# Patient Record
Sex: Male | Born: 1965 | Race: Black or African American | Hispanic: No | Marital: Married | Smoking: Current every day smoker
Health system: Southern US, Community
[De-identification: ages and names within clinical notes are randomized; demographics above are authoritative.]

## PROBLEM LIST (undated history)

## (undated) DIAGNOSIS — I10 Essential (primary) hypertension: Secondary | ICD-10-CM

## (undated) DIAGNOSIS — W3400XA Accidental discharge from unspecified firearms or gun, initial encounter: Secondary | ICD-10-CM

## (undated) DIAGNOSIS — Y249XXA Unspecified firearm discharge, undetermined intent, initial encounter: Secondary | ICD-10-CM

## (undated) HISTORY — PX: OTHER SURGICAL HISTORY: SHX169

## (undated) HISTORY — PX: ELBOW SURGERY: SHX618

## (undated) HISTORY — DX: Unspecified firearm discharge, undetermined intent, initial encounter: Y24.9XXA

## (undated) HISTORY — DX: Accidental discharge from unspecified firearms or gun, initial encounter: W34.00XA

## (undated) HISTORY — PX: KNEE SURGERY: SHX244

---

## 2019-09-21 ENCOUNTER — Other Ambulatory Visit: Payer: Self-pay

## 2019-09-22 ENCOUNTER — Encounter: Payer: Self-pay | Admitting: Gastroenterology

## 2019-09-22 ENCOUNTER — Encounter: Payer: Self-pay | Admitting: Medical

## 2019-09-22 ENCOUNTER — Ambulatory Visit (INDEPENDENT_AMBULATORY_CARE_PROVIDER_SITE_OTHER): Payer: Medicare Other | Admitting: Medical

## 2019-09-22 ENCOUNTER — Other Ambulatory Visit: Payer: Self-pay | Admitting: *Deleted

## 2019-09-22 VITALS — BP 143/90 | HR 75 | Temp 98.1°F | Resp 16 | Ht 72.0 in | Wt 199.2 lb

## 2019-09-22 DIAGNOSIS — N529 Male erectile dysfunction, unspecified: Secondary | ICD-10-CM

## 2019-09-22 DIAGNOSIS — R5383 Other fatigue: Secondary | ICD-10-CM | POA: Diagnosis not present

## 2019-09-22 DIAGNOSIS — M5441 Lumbago with sciatica, right side: Secondary | ICD-10-CM | POA: Diagnosis not present

## 2019-09-22 DIAGNOSIS — R3915 Urgency of urination: Secondary | ICD-10-CM

## 2019-09-22 DIAGNOSIS — G8929 Other chronic pain: Secondary | ICD-10-CM

## 2019-09-22 DIAGNOSIS — F17200 Nicotine dependence, unspecified, uncomplicated: Secondary | ICD-10-CM | POA: Diagnosis not present

## 2019-09-22 DIAGNOSIS — I10 Essential (primary) hypertension: Secondary | ICD-10-CM

## 2019-09-22 DIAGNOSIS — Z1211 Encounter for screening for malignant neoplasm of colon: Secondary | ICD-10-CM

## 2019-09-22 DIAGNOSIS — Z125 Encounter for screening for malignant neoplasm of prostate: Secondary | ICD-10-CM

## 2019-09-22 MED ORDER — LOSARTAN POTASSIUM 25 MG PO TABS: 25.0000 mg | ORAL_TABLET | Freq: Every day | ORAL | 3 refills | Status: DC

## 2019-09-22 MED ORDER — TADALAFIL 20 MG PO TABS: 10.0000 mg | ORAL_TABLET | ORAL | 11 refills | Status: DC | PRN

## 2019-09-22 NOTE — Progress Notes (Signed)
Patient called while at the pharmacy and was not able to receive prescriptions due to them being printed. Prescriptions resent to pharmacy electronically.

## 2019-09-22 NOTE — Patient Instructions (Addendum)
For htn rx losartan 25 mg daily.  For ED, I rx cialis. Rx advisement given.  For smoking will consider rx wellbutrin but want to make sure bp levels controlled first.  Will get cbc, cmp, lipid panel and psa today.(future fasting later this week or early next week)  For back pain continue current gabapentin, flexril and PT.  Referal place for screening coloncopy.  Follow up 2-3 weeks for bp check.
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
## 2019-10-01 ENCOUNTER — Other Ambulatory Visit (INDEPENDENT_AMBULATORY_CARE_PROVIDER_SITE_OTHER): Payer: Medicare Other

## 2019-10-01 ENCOUNTER — Other Ambulatory Visit: Payer: Self-pay

## 2019-10-01 ENCOUNTER — Other Ambulatory Visit: Payer: Medicare Other

## 2019-10-01 DIAGNOSIS — R3915 Urgency of urination: Secondary | ICD-10-CM

## 2019-10-01 DIAGNOSIS — R5383 Other fatigue: Secondary | ICD-10-CM

## 2019-10-01 DIAGNOSIS — Z125 Encounter for screening for malignant neoplasm of prostate: Secondary | ICD-10-CM | POA: Diagnosis not present

## 2019-10-01 DIAGNOSIS — I10 Essential (primary) hypertension: Secondary | ICD-10-CM

## 2019-10-01 NOTE — Addendum Note (Signed)
Addended by: Kelle Darting A on: 10/01/2019 02:22 PM   Modules accepted: Orders

## 2019-10-01 NOTE — Addendum Note (Signed)
Addended by: Kelle Darting A on: 10/01/2019 02:23 PM   Modules accepted: Orders

## 2019-10-02 LAB — CBC WITH DIFFERENTIAL/PLATELET
Absolute Monocytes: 559 cells/uL (ref 200–950)
Basophils Absolute: 51 cells/uL (ref 0–200)
Basophils Relative: 0.9 %
Eosinophils Absolute: 171 cells/uL (ref 15–500)
Eosinophils Relative: 3 %
HCT: 40.6 % (ref 38.5–50.0)
Hemoglobin: 13.9 g/dL (ref 13.2–17.1)
Lymphs Abs: 2286 cells/uL (ref 850–3900)
MCH: 31.4 pg (ref 27.0–33.0)
MCHC: 34.2 g/dL (ref 32.0–36.0)
MCV: 91.9 fL (ref 80.0–100.0)
MPV: 10.9 fL (ref 7.5–12.5)
Monocytes Relative: 9.8 %
Neutro Abs: 2633 cells/uL (ref 1500–7800)
Neutrophils Relative %: 46.2 %
Platelets: 206 10*3/uL (ref 140–400)
RBC: 4.42 10*6/uL (ref 4.20–5.80)
RDW: 13.3 % (ref 11.0–15.0)
Total Lymphocyte: 40.1 %
WBC: 5.7 10*3/uL (ref 3.8–10.8)

## 2019-10-02 LAB — COMPREHENSIVE METABOLIC PANEL
AG Ratio: 1.3 (calc) (ref 1.0–2.5)
ALT: 17 U/L (ref 9–46)
AST: 21 U/L (ref 10–35)
Albumin: 4 g/dL (ref 3.6–5.1)
Alkaline phosphatase (APISO): 55 U/L (ref 35–144)
BUN: 12 mg/dL (ref 7–25)
CO2: 24 mmol/L (ref 20–32)
Calcium: 9 mg/dL (ref 8.6–10.3)
Chloride: 104 mmol/L (ref 98–110)
Creat: 1.03 mg/dL (ref 0.70–1.33)
Globulin: 3 g/dL (calc) (ref 1.9–3.7)
Glucose, Bld: 99 mg/dL (ref 65–99)
Potassium: 4.3 mmol/L (ref 3.5–5.3)
Sodium: 136 mmol/L (ref 135–146)
Total Bilirubin: 0.4 mg/dL (ref 0.2–1.2)
Total Protein: 7 g/dL (ref 6.1–8.1)

## 2019-10-02 LAB — LIPID PANEL
Cholesterol: 145 mg/dL (ref <200)
HDL: 47 mg/dL (ref >=40)
LDL Cholesterol (Calc): 84 mg/dL (calc)
Non-HDL Cholesterol (Calc): 98 mg/dL (calc) (ref <130)
Total CHOL/HDL Ratio: 3.1 (calc) (ref <5.0)
Triglycerides: 63 mg/dL (ref <150)

## 2019-10-02 LAB — PSA: PSA: 0.6 ng/mL (ref <=4.0)

## 2019-10-08 ENCOUNTER — Other Ambulatory Visit: Payer: Self-pay

## 2019-10-08 ENCOUNTER — Encounter: Payer: Self-pay | Admitting: Medical

## 2019-10-08 ENCOUNTER — Ambulatory Visit (INDEPENDENT_AMBULATORY_CARE_PROVIDER_SITE_OTHER): Payer: Medicare Other | Admitting: Medical

## 2019-10-08 VITALS — BP 123/79 | HR 73 | Temp 97.9°F | Resp 16 | Ht 72.0 in | Wt 198.6 lb

## 2019-10-08 DIAGNOSIS — G8929 Other chronic pain: Secondary | ICD-10-CM

## 2019-10-08 DIAGNOSIS — F17200 Nicotine dependence, unspecified, uncomplicated: Secondary | ICD-10-CM

## 2019-10-08 DIAGNOSIS — I10 Essential (primary) hypertension: Secondary | ICD-10-CM

## 2019-10-08 DIAGNOSIS — M5441 Lumbago with sciatica, right side: Secondary | ICD-10-CM | POA: Diagnosis not present

## 2019-10-08 NOTE — Progress Notes (Signed)
Subjective:    Patient ID: Cory Benson, male    DOB: July 20, 1966, 53 y.o.   MRN: [id number]  HPI  Pt in for follow up.  He feels well.  Pt gives me updates me that his colonoscopy is scheduled upcoming.  Pt has chronic back pain and is doing conservative measures. Pt was seeing a physical therapist. Cost was too high. Pt did sign release forms to get his old records. Pt does not want to do surgery. He is on both gabapentin and flexeril.  Aware of side effects of medication/not recommended to drive.  Pt has ED and I rx'd  cialis on last visit. It does work.  Pt blood pressure is well controlled today.  Pt declines flu vaccine today again.   Review of Systems  Constitutional: Negative for chills, fatigue and fever.  HENT: Negative for congestion, dental problem, postnasal drip, sinus pressure and sneezing.   Respiratory: Negative for cough, chest tightness, shortness of breath and wheezing.   Cardiovascular: Negative for chest pain and palpitations.  Gastrointestinal: Negative for abdominal distention, blood in stool, constipation and vomiting.  Musculoskeletal: Negative for back pain.  Skin: Negative for rash.  Neurological: Negative for dizziness, speech difficulty, weakness, numbness and headaches.  Hematological: Negative for adenopathy. Does not bruise/bleed easily.  Psychiatric/Behavioral: Negative for behavioral problems and dysphoric mood. The patient is not nervous/anxious.     No past medical history on file.   Social History   Socioeconomic History  . Marital status: Single    Spouse name: Not on file  . Number of children: Not on file  . Years of education: Not on file  . Highest education level: Not on file  Occupational History  . Not on file  Social Needs  . Financial resource strain: Not on file  . Food insecurity    Worry: Not on file    Inability: Not on file  . Transportation needs    Medical: Not on file    Non-medical: Not on file  Tobacco Use   . Smoking status: Current Every Day Smoker  . Smokeless tobacco: Never Used  Substance and Sexual Activity  . Alcohol use: Not Currently  . Drug use: Never  . Sexual activity: Not on file  Lifestyle  . Physical activity    Days per week: Not on file    Minutes per session: Not on file  . Stress: Not on file  Relationships  . Social Herbalist on phone: Not on file    Gets together: Not on file    Attends religious service: Not on file    Active member of club or organization: Not on file    Attends meetings of clubs or organizations: Not on file    Relationship status: Not on file  . Intimate partner violence    Fear of current or ex partner: Not on file    Emotionally abused: Not on file    Physically abused: Not on file    Forced sexual activity: Not on file  Other Topics Concern  . Not on file  Social History Narrative  . Not on file     No family history on file.  Not on File  Current Outpatient Medications on File Prior to Visit  Medication Sig Dispense Refill  . cyclobenzaprine (FLEXERIL) 10 MG tablet Take 10 mg by mouth 3 (three) times daily as needed for muscle spasms.    Marland Kitchen gabapentin (NEURONTIN) 800 MG tablet Take 800 mg  by mouth 2 (two) times daily.    Marland Kitchen losartan (COZAAR) 25 MG tablet Take 1 tablet (25 mg total) by mouth daily. 30 tablet 3  . tadalafil (CIALIS) 20 MG tablet Take 0.5-1 tablets (10-20 mg total) by mouth every other day as needed for erectile dysfunction. 10 tablet 11   No current facility-administered medications on file prior to visit.     BP 123/79   Pulse 73   Temp 97.9 F (36.6 C) (Temporal)   Resp 16   Ht 6' (1.829 m)   Wt 198 lb 9.6 oz (90.1 kg)   SpO2 93%   BMI 26.94 kg/m       Objective:   Physical Exam  General- No acute distress. Pleasant patient. Neck- Full range of motion, no jvd Lungs- Clear, even and unlabored. Heart- regular rate and rhythm. Neurologic- CNII- XII grossly intact.    Assessment &  Plan:  Your blood pressure is well controlled today. Continue losartan.  For ED, continue with cialis.  For smoking cessation, I want you to consider use of wellbutrin.  For chronic back pain, I can refer you to sports medicine to get opinion as you don't want to do surgery. Best to get copy of most recently MRI before we refer.   Follow up 3 months or as needed  25 minutes spent with pt. 50% of time spent counseling pt on plan going forward.

## 2019-10-08 NOTE — Patient Instructions (Addendum)
Your blood pressure is well controlled today. Continue losartan.  For ED, continue with cialis.  For smoking cessation, I want you to consider use of wellbutrin.  For chronic back pain, I can refer you to sports medicine to get opinion as you don't want to do surgery. Best to get copy of most recently MRI before we refer.   Follow up 3 months or as needed

## 2019-10-12 ENCOUNTER — Other Ambulatory Visit: Payer: Self-pay

## 2019-10-12 ENCOUNTER — Ambulatory Visit (AMBULATORY_SURGERY_CENTER): Payer: Self-pay

## 2019-10-12 VITALS — Temp 96.8°F | Ht 72.0 in | Wt 201.4 lb

## 2019-10-12 DIAGNOSIS — Z1211 Encounter for screening for malignant neoplasm of colon: Secondary | ICD-10-CM

## 2019-10-12 MED ORDER — NA SULFATE-K SULFATE-MG SULF 17.5-3.13-1.6 GM/177ML PO SOLN: 1.0000 | Freq: Once | ORAL | 0 refills | Status: AC

## 2019-10-12 NOTE — Progress Notes (Signed)
Denies allergies to eggs or soy products. Denies complication of anesthesia or sedation. Denies use of weight loss medication. Denies use of O2.   Emmi instructions given for colonoscopy.  Covid screening is scheduled for Tuesday 10/26/19 @ 2:30 Pm.

## 2019-10-26 ENCOUNTER — Ambulatory Visit (INDEPENDENT_AMBULATORY_CARE_PROVIDER_SITE_OTHER): Payer: Medicare Other

## 2019-10-26 ENCOUNTER — Telehealth: Payer: Self-pay | Admitting: Gastroenterology

## 2019-10-26 ENCOUNTER — Other Ambulatory Visit: Payer: Self-pay | Admitting: Gastroenterology

## 2019-10-26 DIAGNOSIS — Z1159 Encounter for screening for other viral diseases: Secondary | ICD-10-CM

## 2019-10-26 NOTE — Telephone Encounter (Signed)
Called patient and spoke with patient-patient needs the COVID results printed off for him to come by the office and pick them up for his work; advised patient he would be notified when the results have been printed and are ready; Patient advised to call back to the office at [phone] should questions/concerns arise;  Patient verbalized understanding of information/instructions;

## 2019-10-27 LAB — SARS CORONAVIRUS 2 (TAT 6-24 HRS): SARS Coronavirus 2: NEGATIVE

## 2019-10-27 NOTE — Telephone Encounter (Signed)
Patient returned call to the office- advised of paperwork being placed up front for pick up;

## 2019-10-27 NOTE — Telephone Encounter (Signed)
Left message for patient to call back to the office;  

## 2019-10-29 ENCOUNTER — Encounter: Payer: Self-pay | Admitting: Gastroenterology

## 2019-10-29 ENCOUNTER — Ambulatory Visit (AMBULATORY_SURGERY_CENTER): Payer: Medicare Other | Admitting: Gastroenterology

## 2019-10-29 ENCOUNTER — Other Ambulatory Visit: Payer: Self-pay

## 2019-10-29 VITALS — BP 120/80 | HR 63 | Temp 98.6°F | Resp 22 | Ht 72.0 in | Wt 201.4 lb

## 2019-10-29 DIAGNOSIS — D123 Benign neoplasm of transverse colon: Secondary | ICD-10-CM

## 2019-10-29 DIAGNOSIS — D127 Benign neoplasm of rectosigmoid junction: Secondary | ICD-10-CM

## 2019-10-29 DIAGNOSIS — D128 Benign neoplasm of rectum: Secondary | ICD-10-CM

## 2019-10-29 DIAGNOSIS — K640 First degree hemorrhoids: Secondary | ICD-10-CM

## 2019-10-29 DIAGNOSIS — D122 Benign neoplasm of ascending colon: Secondary | ICD-10-CM

## 2019-10-29 DIAGNOSIS — D125 Benign neoplasm of sigmoid colon: Secondary | ICD-10-CM

## 2019-10-29 DIAGNOSIS — Z1211 Encounter for screening for malignant neoplasm of colon: Secondary | ICD-10-CM | POA: Diagnosis not present

## 2019-10-29 MED ORDER — SODIUM CHLORIDE 0.9 % IV SOLN: 500.0000 mL | Freq: Once | INTRAVENOUS | Status: DC

## 2019-10-29 NOTE — Progress Notes (Signed)
Pt tolerated well. VSS. Awake and to recovery. 

## 2019-10-29 NOTE — Op Note (Signed)
[facility] [name] Procedure Date: 10/29/2019 12:11 PM MRN: [id number] Endoscopist: Gerrit Heck , MD Age: 53 Referring MD:  Date of Birth: [birth year] Gender: Male Account #: [id number] Procedure:                Colonoscopy Indications:              Screening for colorectal malignant neoplasm, This                            is the patient's first colonoscopy Medicines:                Monitored Anesthesia Care Procedure:                Pre-Anesthesia Assessment:                           - Prior to the procedure, a History and Physical                            was performed, and patient medications and                            allergies were reviewed. The patient's tolerance of                            previous anesthesia was also reviewed. The risks                            and benefits of the procedure and the sedation                            options and risks were discussed with the patient.                            All questions were answered, and informed consent                            was obtained. Prior Anticoagulants: The patient has                            taken no previous anticoagulant or antiplatelet                            agents. ASA Grade Assessment: II - A patient with                            mild systemic disease. After reviewing the risks                            and benefits, the patient was deemed in                            satisfactory condition to undergo the procedure.  After obtaining informed consent, the colonoscope                            was passed under direct vision. Throughout the                            procedure, the patient's blood pressure, pulse, and                            oxygen saturations were monitored continuously. The                            Colonoscope was introduced through the anus and                            advanced to the the terminal  ileum. The colonoscopy                            was performed without difficulty. The patient                            tolerated the procedure well. The quality of the                            bowel preparation was adequate. The terminal ileum,                            ileocecal valve, appendiceal orifice, and rectum                            were photographed. Scope In: 12:18:46 PM Scope Out: 12:37:25 PM Scope Withdrawal Time: 0 hours 15 minutes 38 seconds  Total Procedure Duration: 0 hours 18 minutes 39 seconds  Findings:                 The perianal and digital rectal examinations were                            normal.                           Three sessile polyps were found in the sigmoid                            colon, transverse colon and ascending colon. The                            polyps were 3 to 5 mm in size. These polyps were                            removed with a cold snare. Resection and retrieval                            were complete. Estimated blood loss was minimal.  A few sessile polyps were found in the                            recto-sigmoid colon. The polyps were 1 to 3 mm in                            size. Four of these polyps were removed with a cold                            snare for histologic representative evaluation.                            Resection and retrieval were complete. Estimated                            blood loss was minimal.                           A 5 mm polyp was found in the rectum. The polyp was                            sessile. The polyp was removed with a cold snare.                            Resection and retrieval were complete. Estimated                            blood loss was minimal.                           Non-bleeding internal hemorrhoids were found during                            retroflexion. The hemorrhoids were small.                           The terminal ileum  appeared normal. Complications:            No immediate complications. Estimated Blood Loss:     Estimated blood loss was minimal. Impression:               - Three 3 to 5 mm polyps in the sigmoid colon, in                            the transverse colon and in the ascending colon,                            removed with a cold snare. Resected and retrieved.                           - A few 1 to 3 mm polyps at the recto-sigmoid  colon, removed with a cold snare. Resected and                            retrieved.                           - One 5 mm polyp in the rectum, removed with a cold                            snare. Resected and retrieved.                           - Non-bleeding internal hemorrhoids.                           - The examined portion of the ileum was normal. Recommendation:           - Patient has a contact number available for                            emergencies. The signs and symptoms of potential                            delayed complications were discussed with the                            patient. Return to normal activities tomorrow.                            Written discharge instructions were provided to the                            patient.                           - Resume previous diet.                           - Continue present medications.                           - Await pathology results.                           - Repeat colonoscopy in 3 - 5 years for                            surveillance based on pathology results.                           - Return to GI clinic PRN. Gerrit Heck, MD 10/29/2019 12:42:17 PM

## 2019-10-29 NOTE — Progress Notes (Signed)
Called to room to assist during endoscopic procedure.  Patient ID and intended procedure confirmed with present staff. Received instructions for my participation in the procedure from the performing physician.  

## 2019-10-29 NOTE — Patient Instructions (Signed)
Polyps removed today Non-bleeding internal hemorrhoids       YOU HAD AN ENDOSCOPIC PROCEDURE TODAY AT [facility]:   Refer to the procedure report that was given to you for any specific questions about what was found during the examination.  If the procedure report does not answer your questions, please call your gastroenterologist to clarify.  If you requested that your care partner not be given the details of your procedure findings, then the procedure report has been included in a sealed envelope for you to review at your convenience later.  YOU SHOULD EXPECT: Some feelings of bloating in the abdomen. Passage of more gas than usual.  Walking can help get rid of the air that was put into your GI tract during the procedure and reduce the bloating. If you had a lower endoscopy (such as a colonoscopy or flexible sigmoidoscopy) you may notice spotting of blood in your stool or on the toilet paper. If you underwent a bowel prep for your procedure, you may not have a normal bowel movement for a few days.  Please Note:  You might notice some irritation and congestion in your nose or some drainage.  This is from the oxygen used during your procedure.  There is no need for concern and it should clear up in a day or so.  SYMPTOMS TO REPORT IMMEDIATELY:   Following lower endoscopy (colonoscopy or flexible sigmoidoscopy):  Excessive amounts of blood in the stool  Significant tenderness or worsening of abdominal pains  Swelling of the abdomen that is new, acute  Fever of 100F or higher   For urgent or emergent issues, a gastroenterologist can be reached at any hour by calling [phone].   DIET:  We do recommend a small meal at first, but then you may proceed to your regular diet.  Drink plenty of fluids but you should avoid alcoholic beverages for 24 hours.  ACTIVITY:  You should plan to take it easy for the rest of today and you should NOT DRIVE or use heavy machinery until  tomorrow (because of the sedation medicines used during the test).    FOLLOW UP: Our staff will call the number listed on your records 48-72 hours following your procedure to check on you and address any questions or concerns that you may have regarding the information given to you following your procedure. If we do not reach you, we will leave a message.  We will attempt to reach you two times.  During this call, we will ask if you have developed any symptoms of COVID 19. If you develop any symptoms (ie: fever, flu-like symptoms, shortness of breath, cough etc.) before then, please call [phone].  If you test positive for Covid 19 in the 2 weeks post procedure, please call and report this information to [place].    If any biopsies were taken you will be contacted by phone or by letter within the next 1-3 weeks.  Please call us at [phone] if you have not heard about the biopsies in 3 weeks.    SIGNATURES/CONFIDENTIALITY: You and/or your care partner have signed paperwork which will be entered into your electronic medical record.  These signatures attest to the fact that that the information above on your After Visit Summary has been reviewed and is understood.  Full responsibility of the confidentiality of this discharge information lies with you and/or your care-partner.

## 2019-10-29 NOTE — Progress Notes (Signed)
Temp  JR  VS  KA  Pt's states no medical or surgical changes since previsit or office visit.

## 2019-11-02 ENCOUNTER — Telehealth: Payer: Self-pay | Admitting: *Deleted

## 2019-11-02 ENCOUNTER — Telehealth: Payer: Self-pay

## 2019-11-02 NOTE — Telephone Encounter (Signed)
2nd follow up call made.  NALM 

## 2019-11-02 NOTE — Telephone Encounter (Signed)
Left message on f/u call 

## 2019-11-02 NOTE — Telephone Encounter (Signed)
Pt returned call and stated he was feeling fine and was back to work.

## 2019-11-04 ENCOUNTER — Encounter: Payer: Self-pay | Admitting: Gastroenterology

## 2019-11-05 ENCOUNTER — Ambulatory Visit: Payer: Medicare Other | Admitting: Medical

## 2019-11-08 ENCOUNTER — Other Ambulatory Visit: Payer: Self-pay

## 2019-11-09 ENCOUNTER — Ambulatory Visit (HOSPITAL_BASED_OUTPATIENT_CLINIC_OR_DEPARTMENT_OTHER)
Admission: RE | Admit: 2019-11-09 | Discharge: 2019-11-09 | Disposition: A | Discharge status: Home / Self Care | Payer: Medicare Other | Source: Ambulatory Visit | Attending: Medical | Admitting: Medical

## 2019-11-09 ENCOUNTER — Encounter: Payer: Self-pay | Admitting: Medical

## 2019-11-09 ENCOUNTER — Other Ambulatory Visit: Payer: Self-pay

## 2019-11-09 ENCOUNTER — Ambulatory Visit (INDEPENDENT_AMBULATORY_CARE_PROVIDER_SITE_OTHER): Payer: Medicare Other | Admitting: Medical

## 2019-11-09 VITALS — BP 120/80 | HR 83 | Temp 97.7°F | Resp 18 | Wt 201.0 lb

## 2019-11-09 DIAGNOSIS — G8929 Other chronic pain: Secondary | ICD-10-CM

## 2019-11-09 DIAGNOSIS — R030 Elevated blood-pressure reading, without diagnosis of hypertension: Secondary | ICD-10-CM | POA: Diagnosis not present

## 2019-11-09 DIAGNOSIS — M5441 Lumbago with sciatica, right side: Secondary | ICD-10-CM | POA: Insufficient documentation

## 2019-11-09 DIAGNOSIS — F17200 Nicotine dependence, unspecified, uncomplicated: Secondary | ICD-10-CM | POA: Diagnosis not present

## 2019-11-09 MED ORDER — BUPROPION HCL ER (XL) 150 MG PO TB24: 150.0000 mg | ORAL_TABLET | Freq: Every day | ORAL | 2 refills | Status: DC
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
## 2019-11-09 NOTE — Patient Instructions (Addendum)
For low back pain chronic in nature continue current meds and will refer you to sports med. Get xray of lumbar spine today.  For mild elevated bp advise that you get otc bp cuff and check bp daily. Need to make sure not going over 140/90. BP good today on recheck but need to confirm.  For smoking cessation rx wellbutrin.  Follow up in 2 months or as needed

## 2019-11-15 ENCOUNTER — Ambulatory Visit: Payer: Medicare Other | Admitting: Family Medicine

## 2019-11-22 ENCOUNTER — Other Ambulatory Visit: Payer: Self-pay

## 2019-11-22 ENCOUNTER — Encounter: Payer: Self-pay | Admitting: Family Medicine

## 2019-11-22 ENCOUNTER — Ambulatory Visit (INDEPENDENT_AMBULATORY_CARE_PROVIDER_SITE_OTHER): Payer: Medicare Other | Admitting: Family Medicine

## 2019-11-22 VITALS — BP 135/97 | HR 79 | Ht 72.0 in | Wt 201.0 lb

## 2019-11-22 DIAGNOSIS — M5441 Lumbago with sciatica, right side: Secondary | ICD-10-CM | POA: Diagnosis present

## 2019-11-22 DIAGNOSIS — G8929 Other chronic pain: Secondary | ICD-10-CM | POA: Diagnosis not present

## 2019-11-22 NOTE — Patient Instructions (Signed)
Nice to meet you Please try the exercises  Please try heat  Physical therapy will give you a call.   Please send me a message in MyChart with any questions or updates.  Please see me back in 4-6 weeks.   --Dr. Raeford Razor
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
## 2019-11-22 NOTE — Assessment & Plan Note (Signed)
Acute on chronic in nature.  Likely related to the facet degenerative changes as well as the anterior listhesis. -Counseled on home exercise therapy and supportive care. -Referral to physical therapy. -If no improvement may need to consider MRI for facet injections.

## 2019-11-30 ENCOUNTER — Ambulatory Visit: Payer: Medicare Other | Attending: Family Medicine | Admitting: Physical Therapy

## 2019-11-30 ENCOUNTER — Other Ambulatory Visit: Payer: Self-pay

## 2019-11-30 ENCOUNTER — Encounter: Payer: Self-pay | Admitting: Physical Therapy

## 2019-11-30 DIAGNOSIS — M5441 Lumbago with sciatica, right side: Secondary | ICD-10-CM | POA: Diagnosis not present

## 2019-11-30 DIAGNOSIS — M62830 Muscle spasm of back: Secondary | ICD-10-CM

## 2019-11-30 DIAGNOSIS — R29898 Other symptoms and signs involving the musculoskeletal system: Secondary | ICD-10-CM | POA: Diagnosis present

## 2019-11-30 DIAGNOSIS — G8929 Other chronic pain: Secondary | ICD-10-CM | POA: Diagnosis present

## 2019-11-30 DIAGNOSIS — M6281 Muscle weakness (generalized): Secondary | ICD-10-CM | POA: Diagnosis present

## 2019-11-30 DIAGNOSIS — R262 Difficulty in walking, not elsewhere classified: Secondary | ICD-10-CM

## 2019-11-30 NOTE — Patient Instructions (Signed)
    Home exercise program created by Tattianna Schnarr, PT.  For questions, please contact Codee Tutson via phone at [phone] or email at [email or web address]  [facility] [place] [address]  [address] [place], [place], [address] Phone: [phone]   Fax:  [phone]    
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
## 2019-12-02 ENCOUNTER — Ambulatory Visit: Payer: Medicare Other

## 2019-12-02 ENCOUNTER — Other Ambulatory Visit: Payer: Self-pay

## 2019-12-02 DIAGNOSIS — M6281 Muscle weakness (generalized): Secondary | ICD-10-CM

## 2019-12-02 DIAGNOSIS — M62830 Muscle spasm of back: Secondary | ICD-10-CM

## 2019-12-02 DIAGNOSIS — M5441 Lumbago with sciatica, right side: Secondary | ICD-10-CM | POA: Diagnosis not present

## 2019-12-02 DIAGNOSIS — R29898 Other symptoms and signs involving the musculoskeletal system: Secondary | ICD-10-CM

## 2019-12-02 DIAGNOSIS — R262 Difficulty in walking, not elsewhere classified: Secondary | ICD-10-CM

## 2019-12-02 DIAGNOSIS — G8929 Other chronic pain: Secondary | ICD-10-CM
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
## 2019-12-07 ENCOUNTER — Ambulatory Visit: Payer: Medicare Other

## 2019-12-07 ENCOUNTER — Other Ambulatory Visit: Payer: Self-pay

## 2019-12-07 DIAGNOSIS — M5441 Lumbago with sciatica, right side: Secondary | ICD-10-CM | POA: Diagnosis not present

## 2019-12-07 DIAGNOSIS — R262 Difficulty in walking, not elsewhere classified: Secondary | ICD-10-CM

## 2019-12-07 DIAGNOSIS — M62830 Muscle spasm of back: Secondary | ICD-10-CM

## 2019-12-07 DIAGNOSIS — M6281 Muscle weakness (generalized): Secondary | ICD-10-CM

## 2019-12-07 DIAGNOSIS — G8929 Other chronic pain: Secondary | ICD-10-CM

## 2019-12-07 DIAGNOSIS — R29898 Other symptoms and signs involving the musculoskeletal system: Secondary | ICD-10-CM
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
## 2019-12-09 ENCOUNTER — Ambulatory Visit: Payer: Medicare Other

## 2019-12-09 ENCOUNTER — Other Ambulatory Visit: Payer: Self-pay

## 2019-12-09 DIAGNOSIS — R262 Difficulty in walking, not elsewhere classified: Secondary | ICD-10-CM

## 2019-12-09 DIAGNOSIS — M62830 Muscle spasm of back: Secondary | ICD-10-CM

## 2019-12-09 DIAGNOSIS — M5441 Lumbago with sciatica, right side: Secondary | ICD-10-CM

## 2019-12-09 DIAGNOSIS — G8929 Other chronic pain: Secondary | ICD-10-CM

## 2019-12-09 DIAGNOSIS — R29898 Other symptoms and signs involving the musculoskeletal system: Secondary | ICD-10-CM

## 2019-12-09 DIAGNOSIS — M6281 Muscle weakness (generalized): Secondary | ICD-10-CM
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
## 2019-12-14 ENCOUNTER — Other Ambulatory Visit: Payer: Self-pay

## 2019-12-14 ENCOUNTER — Ambulatory Visit: Payer: Medicare Other

## 2019-12-14 DIAGNOSIS — M5441 Lumbago with sciatica, right side: Secondary | ICD-10-CM

## 2019-12-14 DIAGNOSIS — R29898 Other symptoms and signs involving the musculoskeletal system: Secondary | ICD-10-CM

## 2019-12-14 DIAGNOSIS — M62830 Muscle spasm of back: Secondary | ICD-10-CM

## 2019-12-14 DIAGNOSIS — G8929 Other chronic pain: Secondary | ICD-10-CM

## 2019-12-14 DIAGNOSIS — M6281 Muscle weakness (generalized): Secondary | ICD-10-CM

## 2019-12-14 DIAGNOSIS — R262 Difficulty in walking, not elsewhere classified: Secondary | ICD-10-CM
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
## 2019-12-16 ENCOUNTER — Other Ambulatory Visit: Payer: Self-pay

## 2019-12-16 ENCOUNTER — Ambulatory Visit: Payer: Medicare Other

## 2019-12-16 DIAGNOSIS — R262 Difficulty in walking, not elsewhere classified: Secondary | ICD-10-CM

## 2019-12-16 DIAGNOSIS — M5441 Lumbago with sciatica, right side: Secondary | ICD-10-CM | POA: Diagnosis not present

## 2019-12-16 DIAGNOSIS — M62830 Muscle spasm of back: Secondary | ICD-10-CM

## 2019-12-16 DIAGNOSIS — R29898 Other symptoms and signs involving the musculoskeletal system: Secondary | ICD-10-CM

## 2019-12-16 DIAGNOSIS — G8929 Other chronic pain: Secondary | ICD-10-CM

## 2019-12-16 DIAGNOSIS — M6281 Muscle weakness (generalized): Secondary | ICD-10-CM
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
## 2019-12-20 ENCOUNTER — Encounter: Payer: Self-pay | Admitting: Family Medicine

## 2019-12-20 ENCOUNTER — Ambulatory Visit (INDEPENDENT_AMBULATORY_CARE_PROVIDER_SITE_OTHER): Payer: Medicare Other | Admitting: Family Medicine

## 2019-12-20 ENCOUNTER — Other Ambulatory Visit: Payer: Self-pay

## 2019-12-20 VITALS — BP 136/98 | HR 67 | Ht 72.0 in | Wt 198.0 lb

## 2019-12-20 DIAGNOSIS — G8929 Other chronic pain: Secondary | ICD-10-CM | POA: Diagnosis not present

## 2019-12-20 DIAGNOSIS — M5441 Lumbago with sciatica, right side: Secondary | ICD-10-CM | POA: Diagnosis present
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
## 2019-12-20 NOTE — Assessment & Plan Note (Signed)
Pain is ongoing.  Has tried physical therapy and does feel stronger. -Counseled on home exercise therapy and supportive care. -MRI to evaluate for facet disease for the consideration of facet injections.

## 2019-12-20 NOTE — Patient Instructions (Signed)
Good to see you Please try heat on the lower back  Please continue physical therapy  Please call [facility] in a couple of days. [phone]  Please send me a message in MyChart with any questions or updates.  We will set up a virtual visit after the MRI is resulted.   --Dr. Raeford Razor

## 2019-12-21 ENCOUNTER — Ambulatory Visit: Payer: Medicare Other | Attending: Family Medicine | Admitting: Physical Therapy

## 2019-12-21 ENCOUNTER — Encounter: Payer: Self-pay | Admitting: Physical Therapy

## 2019-12-21 DIAGNOSIS — M6281 Muscle weakness (generalized): Secondary | ICD-10-CM | POA: Insufficient documentation

## 2019-12-21 DIAGNOSIS — M62830 Muscle spasm of back: Secondary | ICD-10-CM | POA: Diagnosis present

## 2019-12-21 DIAGNOSIS — R29898 Other symptoms and signs involving the musculoskeletal system: Secondary | ICD-10-CM | POA: Diagnosis present

## 2019-12-21 DIAGNOSIS — G8929 Other chronic pain: Secondary | ICD-10-CM | POA: Insufficient documentation

## 2019-12-21 DIAGNOSIS — M5441 Lumbago with sciatica, right side: Secondary | ICD-10-CM

## 2019-12-21 DIAGNOSIS — R262 Difficulty in walking, not elsewhere classified: Secondary | ICD-10-CM | POA: Diagnosis present
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
## 2019-12-23 ENCOUNTER — Ambulatory Visit: Payer: Medicare Other

## 2019-12-23 ENCOUNTER — Other Ambulatory Visit: Payer: Self-pay

## 2019-12-23 DIAGNOSIS — G8929 Other chronic pain: Secondary | ICD-10-CM

## 2019-12-23 DIAGNOSIS — R262 Difficulty in walking, not elsewhere classified: Secondary | ICD-10-CM

## 2019-12-23 DIAGNOSIS — M6281 Muscle weakness (generalized): Secondary | ICD-10-CM

## 2019-12-23 DIAGNOSIS — M5441 Lumbago with sciatica, right side: Secondary | ICD-10-CM | POA: Diagnosis not present

## 2019-12-23 DIAGNOSIS — M62830 Muscle spasm of back: Secondary | ICD-10-CM

## 2019-12-23 DIAGNOSIS — R29898 Other symptoms and signs involving the musculoskeletal system: Secondary | ICD-10-CM
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
## 2019-12-28 ENCOUNTER — Other Ambulatory Visit: Payer: Self-pay

## 2019-12-28 ENCOUNTER — Ambulatory Visit: Payer: Medicare Other

## 2019-12-28 DIAGNOSIS — G8929 Other chronic pain: Secondary | ICD-10-CM

## 2019-12-28 DIAGNOSIS — R262 Difficulty in walking, not elsewhere classified: Secondary | ICD-10-CM

## 2019-12-28 DIAGNOSIS — M6281 Muscle weakness (generalized): Secondary | ICD-10-CM

## 2019-12-28 DIAGNOSIS — M5441 Lumbago with sciatica, right side: Secondary | ICD-10-CM

## 2019-12-28 DIAGNOSIS — M62830 Muscle spasm of back: Secondary | ICD-10-CM

## 2019-12-28 DIAGNOSIS — R29898 Other symptoms and signs involving the musculoskeletal system: Secondary | ICD-10-CM
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
## 2019-12-29 ENCOUNTER — Ambulatory Visit
Admission: RE | Admit: 2019-12-29 | Discharge: 2019-12-29 | Disposition: A | Discharge status: Home / Self Care | Payer: Medicare Other | Source: Ambulatory Visit | Attending: Family Medicine | Admitting: Family Medicine

## 2019-12-29 DIAGNOSIS — G8929 Other chronic pain: Secondary | ICD-10-CM

## 2019-12-29 DIAGNOSIS — M5441 Lumbago with sciatica, right side: Secondary | ICD-10-CM

## 2019-12-30 ENCOUNTER — Ambulatory Visit: Payer: Medicare Other | Admitting: Physical Therapy

## 2019-12-30 ENCOUNTER — Other Ambulatory Visit: Payer: Self-pay

## 2019-12-30 ENCOUNTER — Encounter: Payer: Self-pay | Admitting: Physical Therapy

## 2019-12-30 DIAGNOSIS — M5441 Lumbago with sciatica, right side: Secondary | ICD-10-CM | POA: Diagnosis not present

## 2019-12-30 DIAGNOSIS — R262 Difficulty in walking, not elsewhere classified: Secondary | ICD-10-CM

## 2019-12-30 DIAGNOSIS — G8929 Other chronic pain: Secondary | ICD-10-CM

## 2019-12-30 DIAGNOSIS — R29898 Other symptoms and signs involving the musculoskeletal system: Secondary | ICD-10-CM

## 2019-12-30 DIAGNOSIS — M62830 Muscle spasm of back: Secondary | ICD-10-CM

## 2019-12-30 DIAGNOSIS — M6281 Muscle weakness (generalized): Secondary | ICD-10-CM
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
## 2019-12-31 ENCOUNTER — Ambulatory Visit (INDEPENDENT_AMBULATORY_CARE_PROVIDER_SITE_OTHER): Payer: Medicare Other | Admitting: Family Medicine

## 2019-12-31 DIAGNOSIS — M48061 Spinal stenosis, lumbar region without neurogenic claudication: Secondary | ICD-10-CM

## 2019-12-31 DIAGNOSIS — M47816 Spondylosis without myelopathy or radiculopathy, lumbar region: Secondary | ICD-10-CM | POA: Diagnosis present

## 2019-12-31 NOTE — Assessment & Plan Note (Signed)
Possible that his facet disease is related to the low back pain. MRI is demonstrating changes  - L4-L5 facet injection  - continue PT  - counseled on supportive
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
## 2019-12-31 NOTE — Assessment & Plan Note (Signed)
Demonstrating severe stenosis on MRI but doesn't present with neurogenic claudication. -Could consider epidural injections if symptoms or not improved with facet injections.

## 2019-12-31 NOTE — Patient Instructions (Signed)
Good to see you Please call [facility] to set up the facet injections   Please send me a message in MyChart with any questions or updates.  Please see me back in 4-6 weeks.   --Dr. Raeford Razor

## 2020-01-04 ENCOUNTER — Other Ambulatory Visit: Payer: Self-pay

## 2020-01-04 ENCOUNTER — Other Ambulatory Visit: Payer: Medicare Other

## 2020-01-04 ENCOUNTER — Ambulatory Visit: Payer: Medicare Other

## 2020-01-04 DIAGNOSIS — R262 Difficulty in walking, not elsewhere classified: Secondary | ICD-10-CM

## 2020-01-04 DIAGNOSIS — M6281 Muscle weakness (generalized): Secondary | ICD-10-CM

## 2020-01-04 DIAGNOSIS — R29898 Other symptoms and signs involving the musculoskeletal system: Secondary | ICD-10-CM

## 2020-01-04 DIAGNOSIS — M62830 Muscle spasm of back: Secondary | ICD-10-CM

## 2020-01-04 DIAGNOSIS — M5441 Lumbago with sciatica, right side: Secondary | ICD-10-CM

## 2020-01-04 DIAGNOSIS — G8929 Other chronic pain: Secondary | ICD-10-CM
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
## 2020-01-06 ENCOUNTER — Encounter: Payer: Medicare Other | Admitting: Physical Therapy

## 2020-01-06 ENCOUNTER — Other Ambulatory Visit: Payer: Medicare Other

## 2020-01-06 ENCOUNTER — Inpatient Hospital Stay: Admission: RE | Admit: 2020-01-06 | Payer: Medicare Other | Source: Ambulatory Visit

## 2020-01-11 ENCOUNTER — Other Ambulatory Visit: Payer: Self-pay

## 2020-01-11 ENCOUNTER — Ambulatory Visit: Payer: Medicare Other | Admitting: Physical Therapy

## 2020-01-11 ENCOUNTER — Encounter: Payer: Self-pay | Admitting: Physical Therapy

## 2020-01-11 DIAGNOSIS — M5441 Lumbago with sciatica, right side: Secondary | ICD-10-CM

## 2020-01-11 DIAGNOSIS — R262 Difficulty in walking, not elsewhere classified: Secondary | ICD-10-CM

## 2020-01-11 DIAGNOSIS — R29898 Other symptoms and signs involving the musculoskeletal system: Secondary | ICD-10-CM

## 2020-01-11 DIAGNOSIS — M62830 Muscle spasm of back: Secondary | ICD-10-CM

## 2020-01-11 DIAGNOSIS — M6281 Muscle weakness (generalized): Secondary | ICD-10-CM

## 2020-01-11 DIAGNOSIS — G8929 Other chronic pain: Secondary | ICD-10-CM
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
## 2020-01-12 ENCOUNTER — Ambulatory Visit
Admission: RE | Admit: 2020-01-12 | Discharge: 2020-01-12 | Disposition: A | Discharge status: Home / Self Care | Payer: Medicare Other | Source: Ambulatory Visit | Attending: Family Medicine | Admitting: Family Medicine

## 2020-01-12 ENCOUNTER — Inpatient Hospital Stay: Admission: RE | Admit: 2020-01-12 | Payer: Medicare Other | Source: Ambulatory Visit

## 2020-01-12 ENCOUNTER — Other Ambulatory Visit: Payer: Medicare Other

## 2020-01-12 DIAGNOSIS — M47816 Spondylosis without myelopathy or radiculopathy, lumbar region: Secondary | ICD-10-CM

## 2020-01-12 MED ORDER — IOPAMIDOL (ISOVUE-M 200) INJECTION 41%
1.0000 mL | Freq: Once | INTRAMUSCULAR | Status: AC
  Administered 2020-01-12: 12:00:00 1 mL via INTRA_ARTICULAR

## 2020-01-12 MED ORDER — METHYLPREDNISOLONE ACETATE 40 MG/ML INJ SUSP (RADIOLOG
120.0000 mg | Freq: Once | INTRAMUSCULAR | Status: AC
  Administered 2020-01-12: 12:00:00 120 mg via INTRA_ARTICULAR

## 2020-01-12 NOTE — Discharge Instructions (Signed)

## 2020-01-14 ENCOUNTER — Ambulatory Visit: Payer: Medicare Other | Admitting: Medical

## 2020-01-18 ENCOUNTER — Other Ambulatory Visit: Payer: Self-pay

## 2020-01-18 ENCOUNTER — Ambulatory Visit: Payer: Medicare Other | Attending: Family Medicine | Admitting: Physical Therapy

## 2020-01-18 ENCOUNTER — Encounter: Payer: Self-pay | Admitting: Physical Therapy

## 2020-01-18 DIAGNOSIS — M5441 Lumbago with sciatica, right side: Secondary | ICD-10-CM | POA: Diagnosis not present

## 2020-01-18 DIAGNOSIS — M6281 Muscle weakness (generalized): Secondary | ICD-10-CM | POA: Diagnosis present

## 2020-01-18 DIAGNOSIS — R29898 Other symptoms and signs involving the musculoskeletal system: Secondary | ICD-10-CM | POA: Insufficient documentation

## 2020-01-18 DIAGNOSIS — G8929 Other chronic pain: Secondary | ICD-10-CM | POA: Diagnosis present

## 2020-01-18 DIAGNOSIS — M62830 Muscle spasm of back: Secondary | ICD-10-CM | POA: Diagnosis present

## 2020-01-18 DIAGNOSIS — R262 Difficulty in walking, not elsewhere classified: Secondary | ICD-10-CM | POA: Diagnosis present
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
## 2020-01-20 ENCOUNTER — Ambulatory Visit: Payer: Medicare Other

## 2020-01-20 ENCOUNTER — Other Ambulatory Visit: Payer: Self-pay

## 2020-01-20 DIAGNOSIS — M5441 Lumbago with sciatica, right side: Secondary | ICD-10-CM

## 2020-01-20 DIAGNOSIS — R29898 Other symptoms and signs involving the musculoskeletal system: Secondary | ICD-10-CM

## 2020-01-20 DIAGNOSIS — R262 Difficulty in walking, not elsewhere classified: Secondary | ICD-10-CM

## 2020-01-20 DIAGNOSIS — G8929 Other chronic pain: Secondary | ICD-10-CM

## 2020-01-20 DIAGNOSIS — M62830 Muscle spasm of back: Secondary | ICD-10-CM

## 2020-01-20 DIAGNOSIS — M6281 Muscle weakness (generalized): Secondary | ICD-10-CM
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
## 2020-01-21 ENCOUNTER — Other Ambulatory Visit: Payer: Self-pay

## 2020-01-21 ENCOUNTER — Ambulatory Visit (INDEPENDENT_AMBULATORY_CARE_PROVIDER_SITE_OTHER): Payer: Medicare Other | Admitting: Medical

## 2020-01-21 ENCOUNTER — Ambulatory Visit (HOSPITAL_BASED_OUTPATIENT_CLINIC_OR_DEPARTMENT_OTHER)
Admission: RE | Admit: 2020-01-21 | Discharge: 2020-01-21 | Disposition: A | Discharge status: Home / Self Care | Payer: Medicare Other | Source: Ambulatory Visit | Attending: Medical | Admitting: Medical

## 2020-01-21 VITALS — BP 150/80 | HR 87 | Temp 97.3°F | Resp 18 | Wt 198.0 lb

## 2020-01-21 DIAGNOSIS — R1013 Epigastric pain: Secondary | ICD-10-CM

## 2020-01-21 DIAGNOSIS — M5441 Lumbago with sciatica, right side: Secondary | ICD-10-CM

## 2020-01-21 DIAGNOSIS — R3589 Other polyuria: Secondary | ICD-10-CM

## 2020-01-21 DIAGNOSIS — K5900 Constipation, unspecified: Secondary | ICD-10-CM

## 2020-01-21 DIAGNOSIS — R739 Hyperglycemia, unspecified: Secondary | ICD-10-CM

## 2020-01-21 DIAGNOSIS — Z833 Family history of diabetes mellitus: Secondary | ICD-10-CM

## 2020-01-21 DIAGNOSIS — R358 Other polyuria: Secondary | ICD-10-CM

## 2020-01-21 DIAGNOSIS — G8929 Other chronic pain: Secondary | ICD-10-CM | POA: Diagnosis not present

## 2020-01-21 LAB — COMPREHENSIVE METABOLIC PANEL
ALT: 20 U/L (ref 0–53)
AST: 24 U/L (ref 0–37)
Albumin: 4.2 g/dL (ref 3.5–5.2)
Alkaline Phosphatase: 63 U/L (ref 39–117)
BUN: 12 mg/dL (ref 6–23)
CO2: 26 mEq/L (ref 19–32)
Calcium: 9.6 mg/dL (ref 8.4–10.5)
Chloride: 104 mEq/L (ref 96–112)
Creatinine, Ser: 1.04 mg/dL (ref 0.40–1.50)
GFR: 89.91 mL/min (ref >60.00)
Glucose, Bld: 100 mg/dL — ABNORMAL HIGH (ref 70–99)
Potassium: 4.3 mEq/L (ref 3.5–5.1)
Sodium: 137 mEq/L (ref 135–145)
Total Bilirubin: 0.6 mg/dL (ref 0.2–1.2)
Total Protein: 7.8 g/dL (ref 6.0–8.3)

## 2020-01-21 LAB — CBC WITH DIFFERENTIAL/PLATELET
Basophils Absolute: 0 10*3/uL (ref 0.0–0.1)
Basophils Relative: 0.5 % (ref 0.0–3.0)
Eosinophils Absolute: 0 10*3/uL (ref 0.0–0.7)
Eosinophils Relative: 0.2 % (ref 0.0–5.0)
HCT: 42.7 % (ref 39.0–52.0)
Hemoglobin: 14.5 g/dL (ref 13.0–17.0)
Lymphocytes Relative: 20.7 % (ref 12.0–46.0)
Lymphs Abs: 1.6 10*3/uL (ref 0.7–4.0)
MCHC: 34.1 g/dL (ref 30.0–36.0)
MCV: 93.3 fl (ref 78.0–100.0)
Monocytes Absolute: 0.7 10*3/uL (ref 0.1–1.0)
Monocytes Relative: 8.4 % (ref 3.0–12.0)
Neutro Abs: 5.5 10*3/uL (ref 1.4–7.7)
Neutrophils Relative %: 70.2 % (ref 43.0–77.0)
Platelets: 242 10*3/uL (ref 150.0–400.0)
RBC: 4.57 Mil/uL (ref 4.22–5.81)
RDW: 15.2 % (ref 11.5–15.5)
WBC: 7.9 10*3/uL (ref 4.0–10.5)

## 2020-01-21 LAB — HEMOGLOBIN A1C: Hgb A1c MFr Bld: 6 % (ref 4.6–6.5)

## 2020-01-21 LAB — LIPASE: Lipase: 18 U/L (ref 11.0–59.0)

## 2020-01-21 MED ORDER — FAMOTIDINE 20 MG PO TABS: 20.0000 mg | ORAL_TABLET | Freq: Every day | ORAL | 3 refills | Status: DC

## 2020-01-21 NOTE — Patient Instructions (Signed)
For back pain continue with PT, sports medicine and epidural injections.  For FH of DM and increased frequency of urination will get a1c. Advise low sugar diet and exercise.  For epigastric pain, I rx'd famotadine. Eat healthy diet and get labs.  For constipation, hydrate well. Increase fiber, get xray abd today to assess stool burden and rx dulcolax.  Follow up 2 weeks or as needed
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
## 2020-01-24 LAB — H. PYLORI BREATH TEST: H. pylori Breath Test: NOT DETECTED

## 2020-01-25 ENCOUNTER — Other Ambulatory Visit: Payer: Self-pay

## 2020-01-25 ENCOUNTER — Ambulatory Visit: Payer: Medicare Other

## 2020-01-25 DIAGNOSIS — M5441 Lumbago with sciatica, right side: Secondary | ICD-10-CM

## 2020-01-25 DIAGNOSIS — M62830 Muscle spasm of back: Secondary | ICD-10-CM

## 2020-01-25 DIAGNOSIS — M6281 Muscle weakness (generalized): Secondary | ICD-10-CM

## 2020-01-25 DIAGNOSIS — R262 Difficulty in walking, not elsewhere classified: Secondary | ICD-10-CM

## 2020-01-25 DIAGNOSIS — G8929 Other chronic pain: Secondary | ICD-10-CM

## 2020-01-25 DIAGNOSIS — R29898 Other symptoms and signs involving the musculoskeletal system: Secondary | ICD-10-CM
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
## 2020-01-27 ENCOUNTER — Other Ambulatory Visit: Payer: Self-pay

## 2020-01-27 ENCOUNTER — Encounter: Payer: Self-pay | Admitting: Physical Therapy

## 2020-01-27 ENCOUNTER — Ambulatory Visit: Payer: Medicare Other | Admitting: Physical Therapy

## 2020-01-27 DIAGNOSIS — R29898 Other symptoms and signs involving the musculoskeletal system: Secondary | ICD-10-CM

## 2020-01-27 DIAGNOSIS — M5441 Lumbago with sciatica, right side: Secondary | ICD-10-CM

## 2020-01-27 DIAGNOSIS — G8929 Other chronic pain: Secondary | ICD-10-CM

## 2020-01-27 DIAGNOSIS — M6281 Muscle weakness (generalized): Secondary | ICD-10-CM

## 2020-01-27 DIAGNOSIS — R262 Difficulty in walking, not elsewhere classified: Secondary | ICD-10-CM

## 2020-01-27 DIAGNOSIS — M62830 Muscle spasm of back: Secondary | ICD-10-CM
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
## 2020-02-02 ENCOUNTER — Other Ambulatory Visit: Payer: Self-pay

## 2020-02-02 ENCOUNTER — Ambulatory Visit (INDEPENDENT_AMBULATORY_CARE_PROVIDER_SITE_OTHER): Payer: Medicare Other | Admitting: Medical

## 2020-02-02 VITALS — BP 133/77 | HR 72 | Temp 97.7°F | Resp 18 | Wt 199.2 lb

## 2020-02-02 DIAGNOSIS — M5441 Lumbago with sciatica, right side: Secondary | ICD-10-CM | POA: Diagnosis not present

## 2020-02-02 DIAGNOSIS — G8929 Other chronic pain: Secondary | ICD-10-CM | POA: Diagnosis not present

## 2020-02-02 DIAGNOSIS — R1013 Epigastric pain: Secondary | ICD-10-CM | POA: Diagnosis not present

## 2020-02-02 MED ORDER — CYCLOBENZAPRINE HCL 10 MG PO TABS: 10.0000 mg | ORAL_TABLET | Freq: Three times a day (TID) | ORAL | 0 refills | Status: DC | PRN

## 2020-02-02 MED ORDER — FAMOTIDINE 20 MG PO TABS: 20.0000 mg | ORAL_TABLET | Freq: Every day | ORAL | 3 refills | Status: DC

## 2020-02-02 MED ORDER — GABAPENTIN 800 MG PO TABS: 800.0000 mg | ORAL_TABLET | Freq: Two times a day (BID) | ORAL | 3 refills | Status: DC
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
## 2020-02-02 NOTE — Patient Instructions (Addendum)
For you chronic back pain with radiating features I refilled your muscle relaxant and gabapentin today. Continue to follow up with sport medicine.   For abdomen pain and negative work up recently we gave you gi cocktail today. Want you to start famotadine and eat healthy diet. If pain persists then consider [place] and referral to GI MD  Follow up 2 weeks or as needed.  Any severe change or worsening abdomen pain then recommend ED evaluation.

## 2020-02-03 ENCOUNTER — Ambulatory Visit: Payer: Medicare Other | Admitting: Family Medicine

## 2020-02-04 ENCOUNTER — Ambulatory Visit: Payer: Medicare Other | Admitting: Medical

## 2020-02-10 ENCOUNTER — Other Ambulatory Visit: Payer: Self-pay

## 2020-02-10 ENCOUNTER — Ambulatory Visit (INDEPENDENT_AMBULATORY_CARE_PROVIDER_SITE_OTHER): Payer: Medicare Other | Admitting: Family Medicine

## 2020-02-10 ENCOUNTER — Encounter: Payer: Self-pay | Admitting: Family Medicine

## 2020-02-10 DIAGNOSIS — M23204 Derangement of unspecified medial meniscus due to old tear or injury, left knee: Secondary | ICD-10-CM | POA: Diagnosis not present

## 2020-02-10 DIAGNOSIS — M47816 Spondylosis without myelopathy or radiculopathy, lumbar region: Secondary | ICD-10-CM | POA: Diagnosis present

## 2020-02-10 DIAGNOSIS — M179 Osteoarthritis of knee, unspecified: Secondary | ICD-10-CM | POA: Insufficient documentation

## 2020-02-10 NOTE — Progress Notes (Signed)
Medication Samples have been provided to the patient.  Drug name: Pennsaid       Strength: 2%        Qty: 2 Boxes  [id number]  Exp.Date: 09/2020  Dosing instructions: Use a pea size amount and rub gently.  The patient has been instructed regarding the correct time, dose, and frequency of taking this medication, including desired effects and most common side effects.   Sherrie George, [place] 4:09 PM 02/10/2020

## 2020-02-10 NOTE — Patient Instructions (Signed)
Good to see you Please continue the exercises  Please try the pennsaid on the knees   Please send me a message in MyChart with any questions or updates.  Please see me back in 4-6 weeks.   --Dr. Raeford Razor
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
## 2020-02-10 NOTE — Assessment & Plan Note (Signed)
History of arthroscopy several years ago.  Likely has degenerative changes of the joint and meniscus. -Counseled on home exercise therapy and supportive care. -Provided samples of Pennsaid. -Could consider injection or imaging.

## 2020-02-10 NOTE — Assessment & Plan Note (Signed)
He has had improvement of his ongoing pain with the facet injections.  Physical therapy has also helped as well. -Counseled on home exercise therapy and supportive care. -Can take muscle relaxer as needed. -Could consider repeat injections

## 2020-02-22 ENCOUNTER — Other Ambulatory Visit: Payer: Self-pay

## 2020-02-25 ENCOUNTER — Ambulatory Visit: Payer: Medicare Other | Admitting: Medical

## 2020-02-28 ENCOUNTER — Telehealth: Payer: Self-pay | Admitting: Medical

## 2020-02-28 NOTE — Chronic Care Management (AMB) (Signed)
°  Chronic Care Management   Note  02/28/2020 Name: Cory Benson MRN: [id number] DOB: Jul 10, 1966  [name] is a 54 y.o. year old male who is a primary care patient of [name], Iris Pert. I reached out to [name] by phone today in response to a referral sent by [name], [name], [name], [name].   [name] was given information about Chronic Care Management services today including:  1. CCM service includes personalized support from designated clinical staff supervised by his physician, including individualized plan of care and coordination with other care providers 2. 24/7 contact phone numbers for assistance for urgent and routine care needs. 3. Service will only be billed when office clinical staff spend 20 minutes or more in a month to coordinate care. 4. Only one practitioner may furnish and bill the service in a calendar month. 5. The patient may stop CCM services at any time (effective at the end of the month) by phone call to the office staff.   Patient agreed to services and verbal consent obtained.   Follow up plan:   [name] [employer] Scheduler

## 2020-03-02 ENCOUNTER — Other Ambulatory Visit: Payer: Self-pay

## 2020-03-03 ENCOUNTER — Ambulatory Visit (HOSPITAL_BASED_OUTPATIENT_CLINIC_OR_DEPARTMENT_OTHER)
Admission: RE | Admit: 2020-03-03 | Discharge: 2020-03-03 | Disposition: A | Discharge status: Home / Self Care | Payer: Medicare Other | Source: Ambulatory Visit | Attending: Medical | Admitting: Medical

## 2020-03-03 ENCOUNTER — Other Ambulatory Visit: Payer: Self-pay

## 2020-03-03 ENCOUNTER — Encounter: Payer: Self-pay | Admitting: Medical

## 2020-03-03 ENCOUNTER — Ambulatory Visit (INDEPENDENT_AMBULATORY_CARE_PROVIDER_SITE_OTHER): Payer: Medicare Other | Admitting: Medical

## 2020-03-03 VITALS — BP 128/78 | HR 80 | Temp 97.7°F | Resp 18 | Ht 73.0 in | Wt 196.4 lb

## 2020-03-03 DIAGNOSIS — R05 Cough: Secondary | ICD-10-CM

## 2020-03-03 DIAGNOSIS — F17200 Nicotine dependence, unspecified, uncomplicated: Secondary | ICD-10-CM | POA: Diagnosis not present

## 2020-03-03 DIAGNOSIS — R059 Cough, unspecified: Secondary | ICD-10-CM

## 2020-03-03 DIAGNOSIS — R739 Hyperglycemia, unspecified: Secondary | ICD-10-CM | POA: Diagnosis not present

## 2020-03-03 DIAGNOSIS — M5441 Lumbago with sciatica, right side: Secondary | ICD-10-CM | POA: Diagnosis not present

## 2020-03-03 DIAGNOSIS — I10 Essential (primary) hypertension: Secondary | ICD-10-CM | POA: Diagnosis not present

## 2020-03-03 DIAGNOSIS — G8929 Other chronic pain: Secondary | ICD-10-CM

## 2020-03-03 LAB — COMPREHENSIVE METABOLIC PANEL
ALT: 17 U/L (ref 0–53)
AST: 22 U/L (ref 0–37)
Albumin: 4 g/dL (ref 3.5–5.2)
Alkaline Phosphatase: 62 U/L (ref 39–117)
BUN: 13 mg/dL (ref 6–23)
CO2: 26 mEq/L (ref 19–32)
Calcium: 8.9 mg/dL (ref 8.4–10.5)
Chloride: 104 mEq/L (ref 96–112)
Creatinine, Ser: 1.02 mg/dL (ref 0.40–1.50)
GFR: 91.91 mL/min (ref >60.00)
Glucose, Bld: 90 mg/dL (ref 70–99)
Potassium: 4.3 mEq/L (ref 3.5–5.1)
Sodium: 136 mEq/L (ref 135–145)
Total Bilirubin: 0.5 mg/dL (ref 0.2–1.2)
Total Protein: 6.9 g/dL (ref 6.0–8.3)

## 2020-03-03 MED ORDER — LOSARTAN POTASSIUM 25 MG PO TABS: 25.0000 mg | ORAL_TABLET | Freq: Every day | ORAL | 3 refills | Status: DC

## 2020-03-03 MED ORDER — BUPROPION HCL ER (XL) 150 MG PO TB24: 150.0000 mg | ORAL_TABLET | Freq: Every day | ORAL | 2 refills | Status: DC

## 2020-03-03 NOTE — Progress Notes (Signed)
   Subjective:    Patient ID: Cory Benson, male    DOB: 1965/11/28, 54 y.o.   MRN: [id number]  HPI  Pt in for follow up.  His back pain has been controlled. Still present but bearable. He has been seeing sports medicine. Has appointment with them next month. He is getting epidural. He might need to restart PT. Pt is thinking of getting a new mattress. Pt is on gabapentin and flexeril. He only states flexeril late at night so does not have to drive.  Pt has some abd pain recently. Pt states famotadine has helped. He is eating better as well.   Pt smokes about 2 packs a day. Smoked total about 34 years. In past pack would last pack would last 3-4 days.     Review of Systems See hpi.    Objective:   Physical Exam  General Mental Status- Alert. General Appearance- Not in acute distress.   Skin General: Color- Normal Color. Moisture- Normal Moisture.  Neck Carotid Arteries- Normal color. Moisture- Normal Moisture. No carotid bruits. No JVD.  Chest and Lung Exam Auscultation: Breath Sounds:-Normal.  Cardiovascular Auscultation:Rythm- Regular. Murmurs & Other Heart Sounds:Auscultation of the heart reveals- No Murmurs.  Abdomen Inspection:-Inspeection Normal. Palpation/Percussion:Note:No mass. Palpation and Percussion of the abdomen reveal- Non Tender, Non Distended + BS, no rebound or guarding.    Neurologic Cranial Nerve exam:- CN III-XII intact(No nystagmus), symmetric smile. Strength:- 5/5 equal and symmetric strength both upper and lower extremities.      Assessment & Plan:  Your blood pressure is high initial today but better on recheck. You have high bp levels  In past as well intermittently. I want to give low dose losartan.  For smoking cessation rx wellbutrin.  For back pain follow advise of sports med MD. Continue current meds.  For hx of mild sugar elevation eat low sugar diet.  Follow up in one month or as needed  Mackie Pai, [name]   Time spent  with patient today was 25  minutes which consisted of chart review, discussing diagnosis,  treatment  Plan and documentation.

## 2020-03-03 NOTE — Patient Instructions (Addendum)
Your blood pressure is high initial today but better on recheck. You have high bp levels  In past as well intermittently. I want to give low dose losartan.  For smoking cessation rx wellbutrin.  For back pain follow advise of sports med MD. Continue current meds.  For hx of mild sugar elevation eat low sugar diet.  Follow up in one month or as needed

## 2020-03-16 ENCOUNTER — Ambulatory Visit (INDEPENDENT_AMBULATORY_CARE_PROVIDER_SITE_OTHER): Payer: Medicare Other | Admitting: Family Medicine

## 2020-03-16 ENCOUNTER — Encounter: Payer: Self-pay | Admitting: Family Medicine

## 2020-03-16 ENCOUNTER — Other Ambulatory Visit: Payer: Self-pay

## 2020-03-16 VITALS — BP 123/82 | HR 68 | Ht 72.0 in | Wt 196.0 lb

## 2020-03-16 DIAGNOSIS — M47816 Spondylosis without myelopathy or radiculopathy, lumbar region: Secondary | ICD-10-CM | POA: Diagnosis present
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
## 2020-03-16 NOTE — Patient Instructions (Signed)
Good to see you Please continue the exercises  Please use heat for the back  They will call you to schedule the facet injections   Please send me a message in MyChart with any questions or updates.  Please see me back in 6-8 weeks.   --Dr. Raeford Razor

## 2020-03-17 NOTE — Assessment & Plan Note (Signed)
Exacerbation of her left-sided pain.  Did get improvement with L4-L5 facet injection does have degenerative changes at L5-S1. -Facet injection at L4-L5 and L5-S1 on the left. -Counseled on home exercise therapy and supportive care. -Could consider medial branch block or radiofrequency ablation.

## 2020-03-20 ENCOUNTER — Encounter: Payer: Self-pay | Admitting: Family Medicine

## 2020-03-22 ENCOUNTER — Other Ambulatory Visit: Payer: Self-pay

## 2020-03-22 ENCOUNTER — Ambulatory Visit: Payer: Medicare Other | Admitting: Pharmacist

## 2020-03-22 DIAGNOSIS — I10 Essential (primary) hypertension: Secondary | ICD-10-CM

## 2020-03-22 DIAGNOSIS — F17200 Nicotine dependence, unspecified, uncomplicated: Secondary | ICD-10-CM

## 2020-03-22 DIAGNOSIS — R739 Hyperglycemia, unspecified: Secondary | ICD-10-CM

## 2020-03-22 NOTE — Patient Instructions (Signed)
Visit Information  Goals Addressed            This Visit's Progress   . Pharmacy Benson Plan       Benson PLAN ENTRY  Current Barriers:  . Chronic Disease Management support, education, and Benson coordination needs related to Tobacco Use Disorder, Pre-Diabetes, Hypertension, Pain, GERD   Hypertension . Pharmacist Clinical Goal(s): o Over the next 90 days, patient will work with PharmD and providers to maintain BP goal <140/90 . Current regimen:  o Losartan 25mg  daily . Interventions: o Requested patient to purchase a blood pressure cuff o Requested patient to check blood pressure 2-3 times per week  . Patient self Benson activities - Over the next 90 days, patient will: o Purchase blood pressure cuff o Check BP 2-3 times per week, document, and provide at future appointments o Ensure daily salt intake < 2300 mg/Noelly Benson  Pre-Diabetes . Pharmacist Clinical Goal(s): o Over the next 180 days, patient will work with PharmD and providers to maintain A1c goal <6.5% . Current regimen:  o Diet and exercise management . Interventions: o Discussed importance of limiting carbohydrates . Patient self Benson activities - Over the next 180 days, patient will: o Limit carbohydrates to 45-60 grams per meal  Tobacco Use Disorder . Pharmacist Clinical Goal(s) o Over the next 90 days, patient will work with PharmD and providers to stop smoking . Current regimen:  o Bupropion XL 150mg  daily . Interventions: o Consider change to bupropion SR 150mg  daily x 3 days then increase to 150mg  twice daily . Patient self Benson activities - Over the next 90 days, patient will: o Reduce the number of cigarettes smoked per Cory Benson o Adhere to smoking cessation regimen  Medication management . Pharmacist Clinical Goal(s): o Over the next 90 days, patient will work with PharmD and providers to maintain optimal medication adherence . Current pharmacy: [facility]  . Interventions o Comprehensive  medication review performed. o Continue current medication management strategy . Patient self Benson activities - Over the next 90 days, patient will: o Focus on medication adherence by filling medications appropriately o Take medications as prescribed o Report any questions or concerns to PharmD and/or provider(s)  Initial goal documentation        Mr. Steeno was given information about Chronic Benson Management services today including:  1. CCM service includes personalized support from designated clinical staff supervised by his physician, including individualized plan of Benson and coordination with other Benson providers 2. 24/7 contact phone numbers for assistance for urgent and routine Benson needs. 3. Standard insurance, coinsurance, copays and deductibles apply for chronic Benson management only during months in which we provide at least 20 minutes of these services. Most insurances cover these services at 100%, however patients may be responsible for any copay, coinsurance and/or deductible if applicable. This service may help you avoid the need for more expensive face-to-face services. 4. Only one practitioner may furnish and bill the service in a calendar month. 5. The patient may stop CCM services at any time (effective at the end of the month) by phone call to the office staff.  Patient agreed to services and verbal consent obtained.   The patient verbalized understanding of instructions provided today and agreed to receive a mailed copy of patient instruction and/or educational materials. Telephone follow up appointment with pharmacy team member scheduled for: 06/29/2020  Cory Benson Cory Benson, PharmD Clinical Pharmacist Cory Benson at [facility] [phone]   Prediabetes Eating Plan Prediabetes is a  condition that causes blood sugar (glucose) levels to be higher than normal. This increases the risk for developing diabetes. In order to prevent diabetes from developing, your  health Benson provider may recommend a diet and other lifestyle changes to help you:  Control your blood glucose levels.  Improve your cholesterol levels.  Manage your blood pressure. Your health Benson provider may recommend working with a diet and nutrition specialist (dietitian) to make a meal plan that is best for you. What are tips for following this plan? Lifestyle  Set weight loss goals with the help of your health Benson team. It is recommended that most people with prediabetes lose 7% of their current body weight.  Exercise for at least 30 minutes at least 5 days a week.  Attend a support group or seek ongoing support from a mental health counselor.  Take over-the-counter and prescription medicines only as told by your health Benson provider. Reading food labels  Read food labels to check the amount of fat, salt (sodium), and sugar in prepackaged foods. Avoid foods that have: ? Saturated fats. ? Trans fats. ? Added sugars.  Avoid foods that have more than 300 milligrams (mg) of sodium per serving. Limit your daily sodium intake to less than 2,300 mg each Cory Benson. Shopping  Avoid buying pre-made and processed foods. Cooking  Cook with olive oil. Do not use butter, lard, or ghee.  Bake, broil, grill, or boil foods. Avoid frying. Meal planning   Work with your dietitian to develop an eating plan that is right for you. This may include: ? Tracking how many calories you take in. Use a food diary, notebook, or mobile application to track what you eat at each meal. ? Using the glycemic index (GI) to plan your meals. The index tells you how quickly a food will raise your blood glucose. Choose low-GI foods. These foods take a longer time to raise blood glucose.  Consider following a Mediterranean diet. This diet includes: ? Several servings each Cory Benson of fresh fruits and vegetables. ? Eating fish at least twice a week. ? Several servings each Cory Benson of whole grains, beans, nuts, and  seeds. ? Using olive oil instead of other fats. ? Moderate alcohol consumption. ? Eating small amounts of red meat and whole-fat dairy.  If you have high blood pressure, you may need to limit your sodium intake or follow a diet such as the DASH eating plan. DASH is an eating plan that aims to lower high blood pressure. What foods are recommended? The items listed below may not be a complete list. Talk with your dietitian about what dietary choices are best for you. Grains Whole grains, such as whole-wheat or whole-grain breads, crackers, cereals, and pasta. Unsweetened oatmeal. Bulgur. Barley. Quinoa. Pasley rice. Corn or whole-wheat flour tortillas or taco shells. Vegetables Lettuce. Spinach. Peas. Beets. Cauliflower. Cabbage. Broccoli. Carrots. Tomatoes. Squash. Eggplant. Herbs. Peppers. Onions. Cucumbers. Brussels sprouts. Fruits Berries. Bananas. Apples. Oranges. Grapes. Papaya. Mango. Pomegranate. Kiwi. Grapefruit. Cherries. Meats and other protein foods Seafood. Poultry without skin. Lean cuts of pork and beef. Tofu. Eggs. Nuts. Beans. Dairy Low-fat or fat-free dairy products, such as yogurt, cottage cheese, and cheese. Beverages Water. Tea. Coffee. Sugar-free or diet soda. Seltzer water. Lowfat or no-fat milk. Milk alternatives, such as soy or almond milk. Fats and oils Olive oil. Canola oil. Sunflower oil. Grapeseed oil. Avocado. Walnuts. Sweets and desserts Sugar-free or low-fat pudding. Sugar-free or low-fat ice cream and other frozen treats. Seasoning and other foods Herbs.  Sodium-free spices. Mustard. Relish. Low-fat, low-sugar ketchup. Low-fat, low-sugar barbecue sauce. Low-fat or fat-free mayonnaise. What foods are not recommended? The items listed below may not be a complete list. Talk with your dietitian about what dietary choices are best for you. Grains Refined white flour and flour products, such as bread, pasta, snack foods, and cereals. Vegetables Canned vegetables.  Frozen vegetables with butter or cream sauce. Fruits Fruits canned with syrup. Meats and other protein foods Fatty cuts of meat. Poultry with skin. Breaded or fried meat. Processed meats. Dairy Full-fat yogurt, cheese, or milk. Beverages Sweetened drinks, such as sweet iced tea and soda. Fats and oils Butter. Lard. Ghee. Sweets and desserts Baked goods, such as cake, cupcakes, pastries, cookies, and cheesecake. Seasoning and other foods Spice mixes with added salt. Ketchup. Barbecue sauce. Mayonnaise. Summary  To prevent diabetes from developing, you may need to make diet and other lifestyle changes to help control blood sugar, improve cholesterol levels, and manage your blood pressure.  Set weight loss goals with the help of your health Benson team. It is recommended that most people with prediabetes lose 7 percent of their current body weight.  Consider following a Mediterranean diet that includes plenty of fresh fruits and vegetables, whole grains, beans, nuts, seeds, fish, lean meat, low-fat dairy, and healthy oils. This information is not intended to replace advice given to you by your health Benson provider. Make sure you discuss any questions you have with your health Benson provider. Document Revised: 02/26/2019 Document Reviewed: 01/08/2017 Elsevier Patient Education  2020 [employer].

## 2020-03-22 NOTE — Chronic Care Management (AMB) (Signed)
Chronic Care Management Pharmacy  Name: Cory Benson  MRN: [id number] DOB: Oct 22, 1966  Chief Complaint/ HPI  Cory Benson,  54 y.o. , male presents for their Initial CCM visit with the clinical pharmacist via telephone due to COVID-19 Pandemic.  PCP : Cory Pai, [name]  Their chronic conditions include: Tobacco Use Disorder, Pre-Diabetes, Hypertension, Pain, GERD    Office Visits: 03/03/20: Visit w/ Cory Pai, [name] - HTN - prescribed losartan 25mg  daily. Smoking cessation - prescribed wellbutrin XL 150 daily. Back pain per sports med. Hyperglycemia - low sugar diet. RTC 1 month or sooner.  02/02/20: Visit w/ Cory Pai, [name] - Chronic low back pain - refilled meds. Recommend f/u with sports med. Abd pain - prescribed famotidine (pt did not take from last visit) and recommended to eat healthy.   01/21/20: Visit w/ Cory Pai, [name] - Family Hx of DM - obtained a1c. Back pain - continue PT, sports med, and epidural injections. Epigastric pain - prescribed famotidine and recommended healthy diet. Constipation - increase fiber, get xray of abd, and rx dulcolax. RTC 2 weeks or sooner  Consult Visit: 03/16/20: Sports Med visit w/ Dr. Raeford Benson - Facet arthritis of lumbar region. Pt given facet injection at L4-L5 and L5-S1 on left. Could consider medial branch block or radiofrequency ablation.   02/10/20:  Sports Med visit w/ Dr. Raeford Benson - Facet arthritis of lumbar region. Home exercises recommended. Samples of pennsaid provided. Use muscle relaxer as needed. Could consider injections for back and knee and imaging for knee.  01/27/20: PT w/ Cory Benson - Visit 16/18. Pt med rehab goals and was discharged from PT.   12/31/19: Sports Med visit w/ Dr. Raeford Benson - Facet arthritis of lumbar region. L4-L5 facet injection given. Continue PT.  12/20/19: Sports Med visit w/ Dr. Raeford Benson - Facet arthritis of lumbar region. Pain despite meds and PT. MRI to evaluate for facet disease for the consideration  of facet injections  Medications: Outpatient Encounter Medications as of 03/22/2020  Medication Sig  . buPROPion (WELLBUTRIN XL) 150 MG 24 hr tablet Take 1 tablet (150 mg total) by mouth daily.  . cyclobenzaprine (FLEXERIL) 10 MG tablet Take 1 tablet (10 mg total) by mouth 3 (three) times daily as needed for muscle spasms.  . famotidine (PEPCID) 20 MG tablet Take 1 tablet (20 mg total) by mouth daily.  Marland Kitchen gabapentin (NEURONTIN) 800 MG tablet Take 1 tablet (800 mg total) by mouth 2 (two) times daily.  Marland Kitchen losartan (COZAAR) 25 MG tablet Take 1 tablet (25 mg total) by mouth daily.  . tadalafil (CIALIS) 20 MG tablet Take 0.5-1 tablets (10-20 mg total) by mouth every other Cory Benson as needed for erectile dysfunction.   No facility-administered encounter medications on file as of 03/22/2020.   SDOH Screenings   Alcohol Screen:   . Last Alcohol Screening Score (AUDIT):   Depression (PHQ2-9): Low Risk   . PHQ-2 Score: 0  Financial Resource Strain:   . Difficulty of Paying Living Expenses:   Food Insecurity:   . Worried About Charity fundraiser in the Last Year:   . [place] in the Last Year:   Housing:   . Last Housing Risk Score:   Physical Activity:   . Days of Exercise per Week:   . Minutes of Exercise per Session:   Social Connections:   . Frequency of Communication with Friends and Family:   . Frequency of Social Gatherings with Friends and Family:   . Attends Religious  Services:   . Active Member of Clubs or Organizations:   . Attends Archivist Meetings:   Marland Kitchen Marital Status:   Stress:   . Feeling of Stress :   Tobacco Use: High Risk  . Smoking Tobacco Use: Current Every Cory Benson  . Smokeless Tobacco Use: Never Used  Transportation Needs:   . Film/video editor (Medical):   Marland Kitchen Lack of Transportation (Non-Medical):      Current Diagnosis/Assessment:  Goals Addressed            This Visit's Progress   . Pharmacy Care Plan       CARE PLAN ENTRY  Current  Barriers:  . Chronic Disease Management support, education, and care coordination needs related to Tobacco Use Disorder, Pre-Diabetes, Hypertension, Pain, GERD   Hypertension . Pharmacist Clinical Goal(s): o Over the next 90 days, patient will work with PharmD and providers to maintain BP goal <140/90 . Current regimen:  o Losartan 25mg  daily . Interventions: o Requested patient to purchase a blood pressure cuff o Requested patient to check blood pressure 2-3 times per week  . Patient self care activities - Over the next 90 days, patient will: o Purchase blood pressure cuff o Check BP 2-3 times per week, document, and provide at future appointments o Ensure daily salt intake < 2300 mg/Cory Benson  Pre-Diabetes . Pharmacist Clinical Goal(s): o Over the next 180 days, patient will work with PharmD and providers to maintain A1c goal <6.5% . Current regimen:  o Diet and exercise management . Interventions: o Discussed importance of limiting carbohydrates . Patient self care activities - Over the next 180 days, patient will: o Limit carbohydrates to 45-60 grams per meal  Tobacco Use Disorder . Pharmacist Clinical Goal(s) o Over the next 90 days, patient will work with PharmD and providers to stop smoking . Current regimen:  o Bupropion XL 150mg  daily . Interventions: o Consider change to bupropion SR 150mg  daily x 3 days then increase to 150mg  twice daily . Patient self care activities - Over the next 90 days, patient will: o Reduce the number of cigarettes smoked per Cory Benson o Adhere to smoking cessation regimen  Medication management . Pharmacist Clinical Goal(s): o Over the next 90 days, patient will work with PharmD and providers to maintain optimal medication adherence . Current pharmacy: [facility]  . Interventions o Comprehensive medication review performed. o Continue current medication management strategy . 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   Tobacco Use Disorder   Tobacco Status:  Social History   Tobacco Use  Smoking Status Current Every Rashi Granier Benson  . Packs/Adelfa Lozito: 0.25  . Years: 15.00  . Pack years: 3.75  . Types: Cigarettes  Smokeless Tobacco Never Used    Patient has failed these meds in past: None noted  Patient is currently uncontrolled on the following medications: bupropion XL 150mg  daily  Taking bupropion daily and doesn't feel like it is helping. He is still smoking daily. Does not have the urges to smoke as bad. Very interested in stopping smoking  We discussed:  smoking cessation  Plan -Consider change to bupropion sustained release.  -Consider starting bupropion SR 150mg  once daily for 3  days then increase to 150mg  twice daily  Pre-Diabetes   Recent Relevant Labs: Lab Results  Component Value Date/Time   HGBA1C 6.0 01/21/2020 11:25 AM    Patient has failed these meds in past: None noted  Patient is currently controlled on the following medications: None  Breakfast - Steak and cheese biscuit from work Event organiser) Dinner - [employer], oxtails, cabbage Snacks - [employer], chips, ice cream Drinks: Lipton tea  We discussed: diet and exercise extensively  Plan -Limit carbohydrates to 45-60 grams per meal -Continue control with diet and exercise   Hypertension   CMP Latest Ref Rng & Units 03/03/2020 01/21/2020 10/01/2019  Glucose 70 - 99 mg/dL 90 100(H) 99  BUN 6 - 23 mg/dL 13 12 12   Creatinine 0.40 - 1.50 mg/dL 1.02 1.04 1.03  Sodium 135 - 145 mEq/L 136 137 136  Potassium 3.5 - 5.1 mEq/L 4.3 4.3 4.3  Chloride 96 - 112 mEq/L 104 104 104  CO2 19 - 32 mEq/L 26 26 24   Calcium 8.4 - 10.5 mg/dL 8.9 9.6 9.0  Total Protein  6.0 - 8.3 g/dL 6.9 7.8 7.0  Total Bilirubin 0.2 - 1.2 mg/dL 0.5 0.6 0.4  Alkaline Phos 39 - 117 U/L 62 63 -  AST 0 - 37 U/L 22 24 21   ALT 0 - 53 U/L 17 20 17    Kidney Function Lab Results  Component Value Date/Time   CREATININE 1.02 03/03/2020 11:06 AM   CREATININE 1.04 01/21/2020 11:25 AM   CREATININE 1.03 10/01/2019 02:23 PM   GFR 91.91 03/03/2020 11:06 AM     BP today is: Unable to assess due to phone visit  Office blood pressures are  BP Readings from Last 3 Encounters:  03/16/20 123/82  03/03/20 128/78  02/10/20 132/89   Blood pressure  Patient has failed these meds in the past: None noted  Patient is currently controlled on the following medications: losartan 25mg  daily  Patient checks BP at home infrequently (does not have a blood pressure cuff)  Patient home BP readings are ranging: N/A  We discussed Proper blood pressure measurement technique  Plan -Purchase blood pressure cuff -Check blood pressure 2-3 times per week -Continue current medications   Pain    Patient has failed these meds in past: None noted  Patient is currently controlled on the following medications: cyclobenzaprine 10mg  as needed, gabapentin 800mg  twice daily  Cyclobenzaprine: uses about twice weekly  Plan -Continue current medications   GERD    Patient has failed these meds in past: None noted  Patient is currently controlled on the following medications: famotidine 20mg  daily  Plan -Continue current medications

## 2020-03-30 ENCOUNTER — Other Ambulatory Visit: Payer: Self-pay

## 2020-03-30 ENCOUNTER — Ambulatory Visit
Admission: RE | Admit: 2020-03-30 | Discharge: 2020-03-30 | Disposition: A | Discharge status: Home / Self Care | Payer: Medicare Other | Source: Ambulatory Visit | Attending: Family Medicine | Admitting: Family Medicine

## 2020-03-30 ENCOUNTER — Other Ambulatory Visit: Payer: Self-pay | Admitting: Family Medicine

## 2020-03-30 DIAGNOSIS — M47816 Spondylosis without myelopathy or radiculopathy, lumbar region: Secondary | ICD-10-CM

## 2020-03-30 MED ORDER — IOPAMIDOL (ISOVUE-M 200) INJECTION 41%
1.0000 mL | Freq: Once | INTRAMUSCULAR | Status: AC
  Administered 2020-03-30: 1 mL via INTRA_ARTICULAR

## 2020-03-30 MED ORDER — METHYLPREDNISOLONE ACETATE 40 MG/ML INJ SUSP (RADIOLOG
120.0000 mg | Freq: Once | INTRAMUSCULAR | Status: AC
  Administered 2020-03-30: 120 mg via INTRA_ARTICULAR

## 2020-03-30 NOTE — Discharge Instructions (Signed)

## 2020-04-07 ENCOUNTER — Ambulatory Visit: Payer: Medicare Other | Admitting: Medical

## 2020-04-07 NOTE — Progress Notes (Deleted)
   Subjective:    Patient ID: Cory Benson, male    DOB: [birth year], 54 y.o.   MRN: [id number]  HPI    Review of Systems     Objective:   Physical Exam        Assessment & Plan:

## 2020-04-12 ENCOUNTER — Ambulatory Visit: Payer: Medicare Other | Admitting: Medical

## 2020-04-12 ENCOUNTER — Other Ambulatory Visit: Payer: Self-pay

## 2020-04-21 ENCOUNTER — Ambulatory Visit: Payer: Medicare Other | Admitting: Medical

## 2020-04-27 ENCOUNTER — Encounter: Payer: Self-pay | Admitting: Family Medicine

## 2020-04-27 ENCOUNTER — Ambulatory Visit (INDEPENDENT_AMBULATORY_CARE_PROVIDER_SITE_OTHER): Payer: Medicare Other | Admitting: Family Medicine

## 2020-04-27 ENCOUNTER — Other Ambulatory Visit: Payer: Self-pay

## 2020-04-27 VITALS — BP 138/88 | HR 64 | Ht 72.0 in | Wt 195.0 lb

## 2020-04-27 DIAGNOSIS — M47816 Spondylosis without myelopathy or radiculopathy, lumbar region: Secondary | ICD-10-CM | POA: Diagnosis not present

## 2020-04-27 DIAGNOSIS — M48061 Spinal stenosis, lumbar region without neurogenic claudication: Secondary | ICD-10-CM
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
## 2020-04-27 NOTE — Patient Instructions (Signed)
Good to see you Please continue heat  Please continue the exercises  The neurosurgery office will give you a call   Please send me a message in MyChart with any questions or updates.  Please see [place] back as needed.   --Dr. Raeford Razor

## 2020-04-27 NOTE — Assessment & Plan Note (Signed)
We have tried different sets of facet injections. Received little improvement with second round.  - counseled on HEP and supportive care - could consider RFA  - referral to neurosurgery
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
## 2020-05-01 ENCOUNTER — Ambulatory Visit (INDEPENDENT_AMBULATORY_CARE_PROVIDER_SITE_OTHER): Payer: Medicare Other | Admitting: Medical

## 2020-05-01 ENCOUNTER — Other Ambulatory Visit: Payer: Self-pay

## 2020-05-01 VITALS — BP 134/84 | HR 69 | Temp 97.0°F | Resp 18 | Ht 72.0 in | Wt 201.0 lb

## 2020-05-01 DIAGNOSIS — I10 Essential (primary) hypertension: Secondary | ICD-10-CM

## 2020-05-01 DIAGNOSIS — M5441 Lumbago with sciatica, right side: Secondary | ICD-10-CM | POA: Diagnosis not present

## 2020-05-01 DIAGNOSIS — G8929 Other chronic pain: Secondary | ICD-10-CM

## 2020-05-01 NOTE — Patient Instructions (Addendum)
You have chronic low back pain with some radiating features and some occasional very transient leg weakness. Failing conservative treatment epidural.  I do think good idea to see the neurosurgeon as you have already scheduled.   If you don't like opinion from that surgeon can refer to local neurosurgeon.  Continue flexeril and gabapentin. Can add low dose otc tylenol and ibuprofen low dose 200 mg tab.   Your bp is well controlled today. Continue losartan. Limit use of ibuprofen and when you use no more dosage than 200 mg.  Follow up 1 month or as needed
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
## 2020-05-18 ENCOUNTER — Other Ambulatory Visit: Payer: Self-pay

## 2020-05-18 MED ORDER — LOSARTAN POTASSIUM 25 MG PO TABS: 25.0000 mg | ORAL_TABLET | Freq: Every day | ORAL | 0 refills | Status: DC

## 2020-05-19 ENCOUNTER — Other Ambulatory Visit: Payer: Self-pay

## 2020-05-19 ENCOUNTER — Ambulatory Visit (INDEPENDENT_AMBULATORY_CARE_PROVIDER_SITE_OTHER): Payer: Medicare Other | Admitting: Medical

## 2020-05-19 ENCOUNTER — Ambulatory Visit (HOSPITAL_BASED_OUTPATIENT_CLINIC_OR_DEPARTMENT_OTHER)
Admission: RE | Admit: 2020-05-19 | Discharge: 2020-05-19 | Disposition: A | Discharge status: Home / Self Care | Payer: Medicare Other | Source: Ambulatory Visit | Attending: Medical | Admitting: Medical

## 2020-05-19 VITALS — BP 128/80 | HR 76 | Temp 98.7°F | Resp 17 | Ht 72.0 in | Wt 198.0 lb

## 2020-05-19 DIAGNOSIS — G8929 Other chronic pain: Secondary | ICD-10-CM

## 2020-05-19 DIAGNOSIS — M5441 Lumbago with sciatica, right side: Secondary | ICD-10-CM

## 2020-05-19 MED ORDER — HYDROCODONE-ACETAMINOPHEN 5-325 MG PO TABS: 1.0000 | ORAL_TABLET | Freq: Four times a day (QID) | ORAL | 0 refills | Status: DC | PRN
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
## 2020-05-29 ENCOUNTER — Ambulatory Visit (INDEPENDENT_AMBULATORY_CARE_PROVIDER_SITE_OTHER): Payer: Medicare Other | Admitting: Medical

## 2020-05-29 ENCOUNTER — Other Ambulatory Visit: Payer: Self-pay

## 2020-05-29 VITALS — BP 120/84 | HR 80 | Temp 98.1°F | Resp 18 | Ht 72.0 in | Wt 197.8 lb

## 2020-05-29 DIAGNOSIS — M5442 Lumbago with sciatica, left side: Secondary | ICD-10-CM | POA: Diagnosis not present

## 2020-05-29 MED ORDER — HYDROCODONE-ACETAMINOPHEN 5-325 MG PO TABS: 1.0000 | ORAL_TABLET | Freq: Four times a day (QID) | ORAL | 0 refills | Status: DC | PRN
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
## 2020-05-29 NOTE — Patient Instructions (Addendum)
For your recent new onset neck pain and new left lower extremity radicular pain from back, I am placing referral to see sports medicine.  You might need MRIs of both areas. Will defer to sports med MD.   Continue with low-dose ibuprofen continue with Flexeril and gabapentin as well.  Refilling your prescription of Norco for additional 5 days.  If you need refills again of Norco then would recommend placing you on contract and getting UDS.  Rx advisement given.  Follow-up in 2 weeks or as needed.

## 2020-05-30 ENCOUNTER — Encounter: Payer: Self-pay | Admitting: Family Medicine

## 2020-05-30 ENCOUNTER — Ambulatory Visit (INDEPENDENT_AMBULATORY_CARE_PROVIDER_SITE_OTHER): Payer: Self-pay | Admitting: Family Medicine

## 2020-05-30 ENCOUNTER — Ambulatory Visit (HOSPITAL_BASED_OUTPATIENT_CLINIC_OR_DEPARTMENT_OTHER)
Admission: RE | Admit: 2020-05-30 | Discharge: 2020-05-30 | Disposition: A | Discharge status: Home / Self Care | Payer: Medicare Other | Source: Ambulatory Visit | Attending: Family Medicine | Admitting: Family Medicine

## 2020-05-30 VITALS — BP 133/76 | HR 88 | Ht 72.0 in | Wt 195.0 lb

## 2020-05-30 DIAGNOSIS — M542 Cervicalgia: Secondary | ICD-10-CM | POA: Diagnosis not present

## 2020-05-30 DIAGNOSIS — M48061 Spinal stenosis, lumbar region without neurogenic claudication: Secondary | ICD-10-CM

## 2020-05-30 DIAGNOSIS — M47816 Spondylosis without myelopathy or radiculopathy, lumbar region: Secondary | ICD-10-CM

## 2020-05-30 DIAGNOSIS — M4807 Spinal stenosis, lumbosacral region: Secondary | ICD-10-CM

## 2020-05-30 DIAGNOSIS — M47812 Spondylosis without myelopathy or radiculopathy, cervical region: Secondary | ICD-10-CM | POA: Insufficient documentation

## 2020-05-30 MED ORDER — METHYLPREDNISOLONE ACETATE 40 MG/ML IJ SUSP
40.0000 mg | Freq: Once | INTRAMUSCULAR | Status: AC
  Administered 2020-05-30: 40 mg via INTRAMUSCULAR

## 2020-05-30 MED ORDER — KETOROLAC TROMETHAMINE 30 MG/ML IJ SOLN
30.0000 mg | Freq: Once | INTRAMUSCULAR | Status: AC
  Administered 2020-05-30: 30 mg via INTRAMUSCULAR

## 2020-05-30 NOTE — Patient Instructions (Signed)
Good to see you Please try heat  Please try light activity  Physical therapy will give you a call  I will call with the xray results.   Please send me a message in MyChart with any questions or updates.  We will set up a virtual visit once the MRI's are resulted.   --Dr. Raeford Razor
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
## 2020-05-31 ENCOUNTER — Telehealth: Payer: Self-pay | Admitting: Family Medicine

## 2020-05-31 ENCOUNTER — Ambulatory Visit: Payer: Medicare Other | Admitting: Medical

## 2020-05-31 NOTE — Telephone Encounter (Signed)
Informed of xray results.   Rosemarie Ax, MD [facility] 05/31/2020, 5:19 PM

## 2020-06-03 ENCOUNTER — Other Ambulatory Visit: Payer: Self-pay | Admitting: Medical

## 2020-06-08 ENCOUNTER — Telehealth: Payer: Self-pay
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
## 2020-06-13 ENCOUNTER — Other Ambulatory Visit: Payer: Self-pay

## 2020-06-13 ENCOUNTER — Ambulatory Visit (INDEPENDENT_AMBULATORY_CARE_PROVIDER_SITE_OTHER): Payer: Medicare Other | Admitting: Medical

## 2020-06-13 ENCOUNTER — Ambulatory Visit: Payer: Medicare Other | Admitting: Physical Therapy

## 2020-06-13 VITALS — BP 124/70 | HR 67 | Temp 98.1°F | Resp 20 | Ht 72.0 in | Wt 199.0 lb

## 2020-06-13 DIAGNOSIS — M5441 Lumbago with sciatica, right side: Secondary | ICD-10-CM | POA: Diagnosis not present

## 2020-06-13 DIAGNOSIS — G8929 Other chronic pain: Secondary | ICD-10-CM | POA: Diagnosis not present

## 2020-06-13 MED ORDER — HYDROCODONE-ACETAMINOPHEN 5-325 MG PO TABS: 1.0000 | ORAL_TABLET | Freq: Four times a day (QID) | ORAL | 0 refills | Status: DC | PRN

## 2020-06-13 MED ORDER — FAMOTIDINE 20 MG PO TABS: 20.0000 mg | ORAL_TABLET | Freq: Every day | ORAL | 11 refills | Status: DC
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
## 2020-06-22 ENCOUNTER — Other Ambulatory Visit: Payer: Medicare Other

## 2020-06-26 ENCOUNTER — Other Ambulatory Visit: Payer: Self-pay | Admitting: Medical

## 2020-06-27 ENCOUNTER — Other Ambulatory Visit: Payer: Self-pay | Admitting: Medical

## 2020-06-29 ENCOUNTER — Encounter: Payer: Self-pay | Admitting: Pharmacist

## 2020-06-29 ENCOUNTER — Ambulatory Visit: Payer: Medicare Other | Admitting: Pharmacist

## 2020-06-29 DIAGNOSIS — I10 Essential (primary) hypertension: Secondary | ICD-10-CM

## 2020-06-29 DIAGNOSIS — R739 Hyperglycemia, unspecified: Secondary | ICD-10-CM

## 2020-06-29 DIAGNOSIS — F17200 Nicotine dependence, unspecified, uncomplicated: Secondary | ICD-10-CM
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
## 2020-06-29 NOTE — Patient Instructions (Signed)
Visit Information  Goals Addressed            This Visit's Progress   . [place] (see longitudinal plan of care for additional care plan information)  Current Barriers:  . Chronic Disease Management support, education, and care coordination needs related to Tobacco Use Disorder, Pre-Diabetes, Hypertension, Pain, GERD    Hypertension BP Readings from Last 3 Encounters:  06/13/20 124/70  05/30/20 133/76  05/29/20 120/84   . Pharmacist Clinical Goal(s): o Over the next 90 days, patient will work with PharmD and providers to maintain BP goal <140/90 . Current regimen:  o Losartan 25mg  daily . Interventions: o Requested patient to purchase a blood pressure cuff (completed) o Requested patient to check blood pressure 2-3 times per week  . Patient self care activities - Over the next 90 days, patient will: o Purchase blood pressure cuff o Check BP 2-3 times per week, document, and provide at future appointments o Ensure daily salt intake < 2300 mg/Prestin Munch  Pre-Diabetes Lab Results  Component Value Date/Time   HGBA1C 6.0 01/21/2020 11:25 AM   . Pharmacist Clinical Goal(s): o Over the next 90 days, patient will work with PharmD and providers to maintain A1c goal <6.5% . Current regimen:  o Diet and exercise management   . Interventions: o Discussed importance of limiting carbohydrates . Patient self care activities - Over the next 180 days, patient will: o Limit carbohydrates to 45-60 grams per meal  Tobacco Use Disorder . Pharmacist Clinical Goal(s) o Over the next 90 days, patient will work with PharmD and providers to stop smoking . Current regimen:  o Bupropion XL 150mg  daily . Patient self care activities - Over the next 90 days, patient will: o Continue smoking cessation o Adhere to smoking cessation regimen  Medication management . Pharmacist Clinical Goal(s): o Over the next 90 days, patient will work with  PharmD and providers to maintain optimal medication adherence . Current pharmacy: [facility]  . Interventions o Comprehensive medication review performed. o Continue current medication management strategy . Patient self care activities - Over the next 90 days, patient will: o Focus on medication adherence by filling medications appropriately o Take medications as prescribed o Report any questions or concerns to PharmD and/or provider(s)  Please see past updates related to this goal by clicking on the "Past Updates" button in the selected goal         The patient verbalized understanding of instructions provided today and agreed to receive a mailed copy of patient instruction and/or educational materials.  Telephone follow up appointment with pharmacy team member scheduled for: 09/29/2020  Melvenia Beam Quorra Rosene, PharmD Clinical Pharmacist [facility] [phone]

## 2020-07-03 ENCOUNTER — Telehealth: Payer: Self-pay | Admitting: Medical

## 2020-07-03 NOTE — Telephone Encounter (Signed)
Error

## 2020-07-16 ENCOUNTER — Ambulatory Visit
Admission: RE | Admit: 2020-07-16 | Discharge: 2020-07-16 | Disposition: A | Discharge status: Home / Self Care | Payer: Medicare Other | Source: Ambulatory Visit | Attending: Family Medicine | Admitting: Family Medicine

## 2020-07-16 DIAGNOSIS — M4807 Spinal stenosis, lumbosacral region: Secondary | ICD-10-CM

## 2020-07-16 DIAGNOSIS — M542 Cervicalgia: Secondary | ICD-10-CM

## 2020-07-18 ENCOUNTER — Telehealth: Payer: Medicare Other | Admitting: Family Medicine

## 2020-07-18 ENCOUNTER — Other Ambulatory Visit: Payer: Self-pay

## 2020-07-18 ENCOUNTER — Telehealth: Payer: Self-pay | Admitting: Family Medicine

## 2020-07-18 NOTE — Telephone Encounter (Signed)
Patient called to apologize for missing provider's call states he is now available & wishes Dr.Schmitz to give him a call back to discuss @ Phone:  [phone]   --glh

## 2020-07-21 ENCOUNTER — Encounter: Payer: Self-pay | Admitting: Medical

## 2020-07-21 ENCOUNTER — Ambulatory Visit (INDEPENDENT_AMBULATORY_CARE_PROVIDER_SITE_OTHER): Payer: Medicare Other | Admitting: Medical

## 2020-07-21 ENCOUNTER — Ambulatory Visit (HOSPITAL_BASED_OUTPATIENT_CLINIC_OR_DEPARTMENT_OTHER)
Admission: RE | Admit: 2020-07-21 | Discharge: 2020-07-21 | Disposition: A | Discharge status: Home / Self Care | Payer: Medicare Other | Source: Ambulatory Visit | Attending: Medical | Admitting: Medical

## 2020-07-21 ENCOUNTER — Other Ambulatory Visit: Payer: Self-pay | Admitting: Medical

## 2020-07-21 ENCOUNTER — Other Ambulatory Visit: Payer: Self-pay

## 2020-07-21 VITALS — BP 120/80 | HR 62 | Temp 98.5°F | Resp 18 | Ht 73.0 in | Wt 198.4 lb

## 2020-07-21 DIAGNOSIS — M25511 Pain in right shoulder: Secondary | ICD-10-CM

## 2020-07-21 DIAGNOSIS — I10 Essential (primary) hypertension: Secondary | ICD-10-CM

## 2020-07-21 DIAGNOSIS — F17200 Nicotine dependence, unspecified, uncomplicated: Secondary | ICD-10-CM

## 2020-07-21 DIAGNOSIS — M25512 Pain in left shoulder: Secondary | ICD-10-CM

## 2020-07-21 DIAGNOSIS — N529 Male erectile dysfunction, unspecified: Secondary | ICD-10-CM

## 2020-07-21 MED ORDER — BUPROPION HCL ER (XL) 150 MG PO TB24: 150.0000 mg | ORAL_TABLET | Freq: Every day | ORAL | 2 refills | Status: DC

## 2020-07-21 MED ORDER — TADALAFIL 20 MG PO TABS: ORAL_TABLET | ORAL | 0 refills | Status: DC

## 2020-07-21 NOTE — Patient Instructions (Signed)
Your bp is well controlled. Continue lorsartan. Check bp 3 times a week. Want to make sure bp controlled/less than 140/90 as you approach surgery.  For ED I am refilling your cialis.  For smoking cessation rx wellbutrin. Also ordering your preop nicotine labs.  For shoulder pain I placed xrays of both shoulders. Can use tylenol for pain presently.   Follow up date to be determined after lab and xray review.

## 2020-07-21 NOTE — Progress Notes (Signed)
   Subjective:    Patient ID: Cory Benson, male    DOB: February 07, 1966, 54 y.o.   MRN: [id number]  HPI  Pt in for follow up.  Pt was scheduled for back surgery. Pt states his nicotine level was too high so his surgery was cancelled.  Pt has ED and he wants refill on ED med.  Also he wants refill of his wellbutrin. He has cut back significantly. Needs to cut back before surgery.    Review of Systems  Constitutional: Negative for chills, fatigue and fever.  Respiratory: Negative for cough, chest tightness, shortness of breath and wheezing.   Cardiovascular: Negative for chest pain and palpitations.  Gastrointestinal: Negative for abdominal pain.  Musculoskeletal: Positive for back pain.       Bilateral shoulder pain and stiff feeling. Notes feels doig trap exercises. Hears some crepitus. Having for 2-3 weeks.  Hematological: Negative for adenopathy. Does not bruise/bleed easily.       Objective:   Physical Exam  General Mental Status- Alert. General Appearance- Not in acute distress.   Skin General: Color- Normal Color. Moisture- Normal Moisture.  Neck Carotid Arteries- Normal color. Moisture- Normal Moisture. No carotid bruits. No JVD.  Chest and Lung Exam Auscultation: Breath Sounds:-Normal.  Cardiovascular Auscultation:Rythm- Regular. Murmurs & Other Heart Sounds:Auscultation of the heart reveals- No Murmurs.  Abdomen Inspection:-Inspeection Normal. Palpation/Percussion:Note:No mass. Palpation and Percussion of the abdomen reveal- Non Tender, Non Distended + BS, no rebound or guarding.   Neurologic Cranial Nerve exam:- CN III-XII intact(No nystagmus), symmetric smile. Strength:- 5/5 equal and symmetric strength both upper and lower extremities.  Shoulders- both sides good rom. Rt side some crepitus feel anterior portion.    Assessment & Plan:  Your bp is well controlled. Continue lorsartan. Check bp 3 times a week. Want to make sure bp controlled/less than 140/90  as you approach surgery.  For ED I am refilling your cialis.  For smoking cessation rx wellbutrin. Also ordering your preop nicotine labs.  For shoulder pain I placed xrays of both shoulders. Can use tylenol for pain presently.   Follow up date to be determined after lab and xray review.

## 2020-07-24 LAB — NICOTINE/COTININE SP
Cotinine: 46 ng/mL
Nicotine screen: <2 ng/mL

## 2020-07-25 ENCOUNTER — Other Ambulatory Visit: Payer: Self-pay

## 2020-07-25 ENCOUNTER — Telehealth (INDEPENDENT_AMBULATORY_CARE_PROVIDER_SITE_OTHER): Payer: Medicare Other | Admitting: Family Medicine

## 2020-07-25 DIAGNOSIS — M48061 Spinal stenosis, lumbar region without neurogenic claudication: Secondary | ICD-10-CM | POA: Diagnosis not present

## 2020-07-25 DIAGNOSIS — M47812 Spondylosis without myelopathy or radiculopathy, cervical region: Secondary | ICD-10-CM | POA: Diagnosis not present
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
## 2020-07-25 NOTE — Assessment & Plan Note (Signed)
MRI was unrevealing for acute changes.  He has been seen by neurosurgery and awaiting surgery.  - counseled on supportive care

## 2020-07-25 NOTE — Assessment & Plan Note (Signed)
No acute changes observed on MRI.   - Could consider facet injections going forward.

## 2020-07-26 ENCOUNTER — Ambulatory Visit: Payer: Medicare Other | Admitting: Medical

## 2020-07-26 ENCOUNTER — Telehealth: Payer: Self-pay | Admitting: Medical

## 2020-07-26 NOTE — Telephone Encounter (Signed)
Caller Gerardo Caiazzo  Call Back # [phone]  Patient states he would like a call back in reference to nicotine.

## 2020-07-27 NOTE — Telephone Encounter (Signed)
Cotinine is metabolite of nicotine. Cotinine is more accurate measure of recent smoking and last longer in the body.Cotinine can last up to 10 days after last smoking. So by testing looks like still smoking. Does [facility] want you to quit completely. What level will be acceptable for him to get surgery. I would recommend stop smoking for at least 10 days. Then repeat test on day 11or 12. Also avoid any second hand smoke.Curent level looks more in line of light smoking.

## 2020-07-27 NOTE — Telephone Encounter (Signed)
Patient wants to know how to reduce his Cotinine level and what it means since [facility] says his level are too high and needs to be retested .

## 2020-07-28 NOTE — Telephone Encounter (Signed)
Pt notified and he said he is unsure about the levels , and he is cutting back on smoking , and youre trying to stay away from second hand smoke

## 2020-08-08 ENCOUNTER — Telehealth: Payer: Self-pay | Admitting: Pharmacist
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
## 2020-08-15 ENCOUNTER — Other Ambulatory Visit: Payer: Self-pay

## 2020-08-15 ENCOUNTER — Ambulatory Visit (INDEPENDENT_AMBULATORY_CARE_PROVIDER_SITE_OTHER): Payer: Medicare Other | Admitting: Medical

## 2020-08-15 VITALS — BP 119/84 | HR 79 | Resp 18 | Ht 72.0 in | Wt 202.0 lb

## 2020-08-15 DIAGNOSIS — G8929 Other chronic pain: Secondary | ICD-10-CM | POA: Diagnosis not present

## 2020-08-15 DIAGNOSIS — M5441 Lumbago with sciatica, right side: Secondary | ICD-10-CM | POA: Diagnosis not present

## 2020-08-15 DIAGNOSIS — Z87891 Personal history of nicotine dependence: Secondary | ICD-10-CM | POA: Diagnosis not present

## 2020-08-15 MED ORDER — GABAPENTIN 800 MG PO TABS: 800.0000 mg | ORAL_TABLET | Freq: Two times a day (BID) | ORAL | 3 refills | Status: DC

## 2020-08-15 MED ORDER — TADALAFIL 20 MG PO TABS: ORAL_TABLET | ORAL | 0 refills | Status: DC
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
## 2020-08-15 NOTE — Patient Instructions (Addendum)
For history low back pain and smoking history/cotinine level preventing surgery. Will repeat nicotine/cotinine levels today.  For back pain continue flexeril, norco and gabapentin.  For ED refilled your cialis.   Follow up 6 week or as needed.

## 2020-08-16 ENCOUNTER — Ambulatory Visit: Payer: Medicare Other | Admitting: Medical

## 2020-08-21 LAB — NICOTINE/COTININE METABOLITES
Cotinine: 107.9 ng/mL
Nicotine: 5.5 ng/mL

## 2020-09-06 ENCOUNTER — Ambulatory Visit: Payer: Medicare Other | Admitting: Medical

## 2020-09-14 ENCOUNTER — Ambulatory Visit: Payer: Medicaid Other | Admitting: Medical

## 2020-09-14 ENCOUNTER — Other Ambulatory Visit: Payer: Self-pay | Admitting: Medical

## 2020-09-21 ENCOUNTER — Other Ambulatory Visit: Payer: Self-pay

## 2020-09-21 ENCOUNTER — Ambulatory Visit (INDEPENDENT_AMBULATORY_CARE_PROVIDER_SITE_OTHER): Payer: 59 | Admitting: Medical

## 2020-09-21 VITALS — BP 112/71 | HR 70 | Resp 18 | Ht 72.0 in | Wt 199.0 lb

## 2020-09-21 DIAGNOSIS — F17200 Nicotine dependence, unspecified, uncomplicated: Secondary | ICD-10-CM | POA: Diagnosis not present

## 2020-09-21 DIAGNOSIS — M5441 Lumbago with sciatica, right side: Secondary | ICD-10-CM | POA: Diagnosis not present

## 2020-09-21 DIAGNOSIS — Z87891 Personal history of nicotine dependence: Secondary | ICD-10-CM | POA: Diagnosis not present

## 2020-09-21 DIAGNOSIS — G8929 Other chronic pain: Secondary | ICD-10-CM | POA: Diagnosis not present

## 2020-09-21 MED ORDER — HYDROCODONE-ACETAMINOPHEN 5-325 MG PO TABS: 1.0000 | ORAL_TABLET | Freq: Four times a day (QID) | ORAL | 0 refills | Status: DC | PRN
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
## 2020-09-22 NOTE — Patient Instructions (Signed)
For lower back continue flexeril, gabapentin and refilled limited norco.  For smoking cessation continue wellbutrin. Hopefully you have quit for good since no smoking reported for 2 weeks.  Repeat nicotine metabolite test. Hopefully negative so you can get back surgery.  If test + then can try chantix in place of wellbutrin. Or potential refer to different surgeon?  Follow up date to be determined after review of nicotine metabolite test.

## 2020-09-25 LAB — NICOTINE/COTININE METABOLITES
Cotinine: 44.7 ng/mL
Nicotine: <1 ng/mL

## 2020-09-26 ENCOUNTER — Telehealth: Payer: Self-pay | Admitting: Medical

## 2020-09-26 NOTE — Telephone Encounter (Signed)
Labs faxed to surgeon    Fax : [phone]

## 2020-09-26 NOTE — Telephone Encounter (Signed)
CallerTyrone Benson  Call Back # [phone]   Patient is asking for a return call from [facility]. Patient states he has the information you need.

## 2020-09-28 ENCOUNTER — Other Ambulatory Visit: Payer: Self-pay | Admitting: Medical

## 2020-09-28 NOTE — Telephone Encounter (Signed)
Patient requesting a call back from [place], [place]

## 2020-09-28 NOTE — Telephone Encounter (Signed)
Labs faxed to [place]   [phone]

## 2020-09-29 ENCOUNTER — Telehealth: Payer: Self-pay | Admitting: Medical

## 2020-09-29 ENCOUNTER — Ambulatory Visit: Payer: 59

## 2020-09-29 DIAGNOSIS — I10 Essential (primary) hypertension: Secondary | ICD-10-CM

## 2020-09-29 DIAGNOSIS — F17200 Nicotine dependence, unspecified, uncomplicated: Secondary | ICD-10-CM
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
## 2020-09-29 NOTE — Patient Instructions (Signed)
Visit Information  Goals Addressed            This Visit's Progress   . [place] (see longitudinal plan of care for additional care plan information)  Current Barriers:  . Chronic Disease Management support, education, and care coordination needs related to Tobacco Use Disorder, Pre-Diabetes, Hypertension, Pain, GERD    Hypertension BP Readings from Last 3 Encounters:  09/21/20 112/71  08/15/20 119/84  07/21/20 120/80   . Pharmacist Clinical Goal(s): o Over the next 90 days, patient will work with PharmD and providers to maintain BP goal <140/90 . Current regimen:  o Losartan 25mg  daily . Interventions: o Requested patient to purchase a blood pressure cuff (completed) o Requested patient to check blood pressure 2-3 times per week  . Patient self care activities - Over the next 90 days, patient will: o Purchase blood pressure cuff o Check BP 2-3 times per week, document, and provide at future appointments o Ensure daily salt intake < 2300 mg/Cory Benson  Pre-Diabetes Lab Results  Component Value Date/Time   HGBA1C 6.0 01/21/2020 11:25 AM   . Pharmacist Clinical Goal(s): o Over the next 90 days, patient will work with PharmD and providers to maintain A1c goal <6.5% . Current regimen:  o Diet and exercise management   . Interventions: o Discussed importance of limiting carbohydrates . Patient self care activities - Over the next 180 days, patient will: o Limit carbohydrates to 45-60 grams per meal  Tobacco Use Disorder . Pharmacist Clinical Goal(s) o Over the next 90 days, patient will work with PharmD and providers to stop smoking . Current regimen:  o Bupropion XL 150mg  daily . Patient self care activities - Over the next 90 days, patient will: o Continue smoking cessation o Adhere to smoking cessation regimen  Medication management . Pharmacist Clinical Goal(s): o Over the next 90 days, patient will work with  PharmD and providers to maintain optimal medication adherence . Current pharmacy: [facility]  . Interventions o Comprehensive medication review performed. o Continue current medication management strategy . Patient self care activities - Over the next 90 days, patient will: o Focus on medication adherence by filling medications appropriately o Take medications as prescribed o Report any questions or concerns to PharmD and/or provider(s)  Please see past updates related to this goal by clicking on the "Past Updates" button in the selected goal         Verbalized understanding of plan today  Telephone follow up appointment with pharmacy team member scheduled for: 01/05/2021  Cory Benson, PharmD Clinical Pharmacist [facility] [phone]

## 2020-09-29 NOTE — Telephone Encounter (Signed)
Caller : [facility]  Call Back # [phone]  Called stating needs a PA ....tadalafil (CIALIS) 20 MG tablet [[id number]]

## 2020-10-02 NOTE — Telephone Encounter (Signed)
PA initiated via Covermymeds; KEY: [id number]. Awaiting determination.

## 2020-10-02 NOTE — Telephone Encounter (Signed)
PA denied. Medications for erectile dysfunction excluded from Medicare rulse.

## 2020-10-06 ENCOUNTER — Other Ambulatory Visit: Payer: Self-pay | Admitting: Medical

## 2020-10-06 ENCOUNTER — Telehealth: Payer: Self-pay

## 2020-10-06 NOTE — Telephone Encounter (Signed)
I denied the script because pt is on [facility] and gabapentin and hasn't had the flexeril since 01/2020 , do you want patient to have flexeril ??

## 2020-10-06 NOTE — Telephone Encounter (Signed)
REFILL REQUEST -    MEDICATION: cyclobenzaprine HCl 10 mg  PHARMACY: [facility]  Comments:  Pt states pharmacy told him the prescription was denied. Pt is asking for a refill for his back pain  **Let patient know to contact pharmacy at the end of the day to make sure medication is ready. **  ** Please notify patient to allow 48-72 hours to process**  **Encourage patient to contact the pharmacy for refills or they can request refills through [facility]**

## 2020-10-07 ENCOUNTER — Other Ambulatory Visit: Payer: Self-pay | Admitting: Medical

## 2020-10-08 ENCOUNTER — Telehealth: Payer: Self-pay | Admitting: Medical

## 2020-10-08 MED ORDER — CYCLOBENZAPRINE HCL 10 MG PO TABS: 10.0000 mg | ORAL_TABLET | Freq: Three times a day (TID) | ORAL | 0 refills | Status: DC | PRN

## 2020-10-08 MED ORDER — HYDROCODONE-ACETAMINOPHEN 5-325 MG PO TABS: 1.0000 | ORAL_TABLET | Freq: Four times a day (QID) | ORAL | 0 refills | Status: DC | PRN

## 2020-10-08 MED ORDER — GABAPENTIN 800 MG PO TABS: 800.0000 mg | ORAL_TABLET | Freq: Two times a day (BID) | ORAL | 0 refills | Status: DC

## 2020-10-08 NOTE — Telephone Encounter (Signed)
I refilled pt meds. Limited 12 tab norco. He uses sparingly.

## 2020-10-08 NOTE — Telephone Encounter (Signed)
Refilled flexeril gabapentin and norco for pt back pain.

## 2020-10-09 ENCOUNTER — Other Ambulatory Visit: Payer: Self-pay | Admitting: Medical

## 2020-10-09 NOTE — Telephone Encounter (Signed)
Medication: tadalafil (CIALIS) 20 MG tablet [[id number]]    Has the patient contacted their pharmacy? No. (If no, request that the patient contact the pharmacy for the refill.) (If yes, when and what did the pharmacy advise?)  Preferred Pharmacy (with phone number or street name): [place], [place], [place] [address]  Phone:  [phone] Fax:  [phone]  DEA #:  --  Agent: Please be advised that RX refills may take up to 3 business days. We ask that you follow-up with your pharmacy.

## 2020-10-09 NOTE — Telephone Encounter (Signed)
Patient is checking the status of refill.

## 2020-10-10 NOTE — Telephone Encounter (Signed)
Rx refill cialis.

## 2020-10-16 ENCOUNTER — Other Ambulatory Visit: Payer: Self-pay

## 2020-10-16 ENCOUNTER — Ambulatory Visit (INDEPENDENT_AMBULATORY_CARE_PROVIDER_SITE_OTHER): Payer: 59 | Admitting: Medical

## 2020-10-16 VITALS — BP 121/83 | HR 77 | Resp 18 | Ht 72.0 in | Wt 197.2 lb

## 2020-10-16 DIAGNOSIS — I10 Essential (primary) hypertension: Secondary | ICD-10-CM | POA: Diagnosis not present

## 2020-10-16 DIAGNOSIS — M5441 Lumbago with sciatica, right side: Secondary | ICD-10-CM | POA: Diagnosis not present

## 2020-10-16 DIAGNOSIS — G5601 Carpal tunnel syndrome, right upper limb: Secondary | ICD-10-CM | POA: Diagnosis not present

## 2020-10-16 DIAGNOSIS — Z87891 Personal history of nicotine dependence: Secondary | ICD-10-CM | POA: Diagnosis not present

## 2020-10-16 DIAGNOSIS — G8929 Other chronic pain: Secondary | ICD-10-CM

## 2020-10-16 NOTE — Patient Instructions (Addendum)
You do appear to have carpel tunnel syndrome. Recommend rt wrist cock up splint and low dose ibuprofen 200-400 mg every 8 hours. If pain persists could refer you to specialist.  For htn continue losartan.  For low back pain chronic surgery pending.  For smoking history continue to encourage not to start back.Cory Benson that your nicotine/cotinine levels low enough to do surgery.  Follow up in 2-3 weeks or as needed    Carpal Tunnel Syndrome  Carpal tunnel syndrome is a condition that causes pain in your hand and arm. The carpal tunnel is a narrow area located on the palm side of your wrist. Repeated wrist motion or certain diseases may cause swelling within the tunnel. This swelling pinches the main nerve in the wrist (median nerve). What are the causes? This condition may be caused by:  Repeated wrist motions.  Wrist injuries.  Arthritis.  A cyst or tumor in the carpal tunnel.  Fluid buildup during pregnancy. Sometimes the cause of this condition is not known. What increases the risk? The following factors may make you more likely to develop this condition:  Having a job, such as being a Research scientist (life sciences), that requires you to repeatedly move your wrist in the same motion.  Being a woman.  Having certain conditions, such as: ? Diabetes. ? Obesity. ? An underactive thyroid (hypothyroidism). ? Kidney failure. What are the signs or symptoms? Symptoms of this condition include:  A tingling feeling in your fingers, especially in your thumb, index, and middle fingers.  Tingling or numbness in your hand.  An aching feeling in your entire arm, especially when your wrist and elbow are bent for a long time.  Wrist pain that goes up your arm to your shoulder.  Pain that goes down into your palm or fingers.  A weak feeling in your hands. You may have trouble grabbing and holding items. Your symptoms may feel worse during the night. How is this diagnosed? This condition is  diagnosed with a medical history and physical exam. You may also have tests, including:  Electromyogram (EMG). This test measures electrical signals sent by your nerves into the muscles.  Nerve conduction study. This test measures how well electrical signals pass through your nerves.  Imaging tests, such as X-rays, ultrasound, and MRI. These tests check for possible causes of your condition. How is this treated? This condition may be treated with:  Lifestyle changes. It is important to stop or change the activity that caused your condition.  Doing exercise and activities to strengthen your muscles and bones (physical therapy).  Learning how to use your hand again after diagnosis (occupational therapy).  Medicines for pain and inflammation. This may include medicine that is injected into your wrist.  A wrist splint.  Surgery. Follow these instructions at home: If you have a splint:  Wear the splint as told by your health care provider. Remove it only as told by your health care provider.  Loosen the splint if your fingers tingle, become numb, or turn cold and blue.  Keep the splint clean.  If the splint is not waterproof: ? Do not let it get wet. ? Cover it with a watertight covering when you take a bath or shower. Managing pain, stiffness, and swelling   If directed, put ice on the painful area: ? If you have a removable splint, remove it as told by your health care provider. ? Put ice in a plastic bag. ? Place a towel between your  skin and the bag. ? Leave the ice on for 20 minutes, 2-3 times per day. General instructions  Take over-the-counter and prescription medicines only as told by your health care provider.  Rest your wrist from any activity that may be causing your pain. If your condition is work related, talk with your employer about changes that can be made, such as getting a wrist pad to use while typing.  Do any exercises as told by your health care  provider, physical therapist, or occupational therapist.  Keep all follow-up visits as told by your health care provider. This is important. Contact a health care provider if:  You have new symptoms.  Your pain is not controlled with medicines.  Your symptoms get worse. Get help right away if:  You have severe numbness or tingling in your wrist or hand. Summary  Carpal tunnel syndrome is a condition that causes pain in your hand and arm.  It is usually caused by repeated wrist motions.  Lifestyle changes and medicines are used to treat carpal tunnel syndrome. Surgery may be recommended.  Follow your health care provider's instructions about wearing a splint, resting from activity, keeping follow-up visits, and calling for help. This information is not intended to replace advice given to you by your health care provider. Make sure you discuss any questions you have with your health care provider. Document Revised: 03/13/2018 Document Reviewed: 03/13/2018 Elsevier Patient Education  [place].
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
## 2020-10-26 ENCOUNTER — Other Ambulatory Visit: Payer: Self-pay | Admitting: Medical

## 2020-10-30 ENCOUNTER — Telehealth: Payer: Self-pay | Admitting: Pharmacist

## 2020-10-30 NOTE — Progress Notes (Addendum)
    Chronic Care Management Pharmacy Assistant   Name: Cory Benson  MRN: [id number] DOB: Oct 03, 1966  Reason for Encounter: Disease State  Patient Questions:  1.  Have you seen any other providers since your last visit?Yes  2.  Any changes in your medicines or health? No     PCP : Mackie Pai, [name]   Their chronic conditions include:  Tobacco Use Disorder, Pre-Diabetes, Hypertension, Pain, GERD.  Office Visits: 10-16-2020 (PCP) Patient presented in the office c/o tingling in his fingers over the past two to three weeks. Per the notes from Dr. Harvie Heck, patient appears to have carpel tunnel syndrome. Reccommended right wrist splint and low dose Ibuprofen every eight hours.  Consults: None since their last CCM visit with the clinical pharmacist on 09-29-2020.  Allergies:  No Known Allergies  Medications: Outpatient Encounter Medications as of 10/30/2020  Medication Sig   buPROPion (WELLBUTRIN XL) 150 MG 24 hr tablet Take 1 tablet (150 mg total) by mouth daily.   cyclobenzaprine (FLEXERIL) 10 MG tablet Take 1 tablet (10 mg total) by mouth 3 (three) times daily as needed for muscle spasms.   famotidine (PEPCID) 20 MG tablet Take 1 tablet (20 mg total) by mouth daily.   gabapentin (NEURONTIN) 800 MG tablet Take 1 tablet (800 mg total) by mouth 2 (two) times daily.   HYDROcodone-acetaminophen (NORCO) 5-325 MG tablet Take 1 tablet by mouth every 6 (six) hours as needed for moderate pain or severe pain.   losartan (COZAAR) 25 MG tablet Take 1 tablet by mouth once daily   tadalafil (CIALIS) 20 MG tablet TAKE 1/2 TO 1 (ONE-HALF TO ONE) TABLET BY MOUTH EVERY OTHER DAY AS NEEDED FOR ERECTILE DYSFUNCTION   No facility-administered encounter medications on file as of 10/30/2020.    Current Diagnosis: Patient Active Problem List   Diagnosis Date Noted   Facet hypertrophy of cervical region 05/30/2020   Degenerative tear of medial meniscus of left knee 02/10/2020   Facet arthritis of  lumbar region 12/31/2019   Spinal stenosis of lumbar region without neurogenic claudication 12/31/2019   Chronic bilateral low back pain with right-sided sciatica 11/22/2019    Goals Addressed   None    Made outbound call to patient for General Adherence touch point. Patient is currently taking Bupropion XL 150 daily for smoking cessation. Inquired if he was able to reschedule his back surgery. He stated he recently took a a blood test to show how much nicotine is in system and he met requirements to schedule his back surgery. States someone should be contacting him soon reports not smoking any cigarettes at all for the past week and a half to two weeks. He is not interested in chantix at this time.   Follow-Up:  Pharmacist Review   Fanny Skates, [place] Pharmacist Assistant [phone]  Reviewed by: De Blanch, PharmD, BCACP Clinical Pharmacist [facility] [phone]

## 2020-11-08 ENCOUNTER — Telehealth: Payer: Self-pay | Admitting: Medical

## 2020-11-08 ENCOUNTER — Ambulatory Visit: Payer: 59 | Admitting: Medical

## 2020-11-08 ENCOUNTER — Other Ambulatory Visit: Payer: Self-pay | Admitting: Medical

## 2020-11-08 NOTE — Telephone Encounter (Signed)
Patient would like some addition refills   Medication: tadalafil (CIALIS) 20 MG tablet [[id number]]       Has the patient contacted their pharmacy?  (If no, request that the patient contact the pharmacy for the refill.) (If yes, when and what did the pharmacy advise?)     Preferred Pharmacy (with phone number or street name):  [facility], [address] [place], [place] [address]  Phone:  [phone] Fax:  [phone]     Agent: Please be advised that RX refills may take up to 3 business days. We ask that you follow-up with your pharmacy.

## 2020-11-08 NOTE — Telephone Encounter (Signed)
Rx sent 

## 2020-11-16 ENCOUNTER — Encounter: Payer: Self-pay | Admitting: Medical

## 2020-11-16 ENCOUNTER — Other Ambulatory Visit: Payer: Self-pay

## 2020-11-16 ENCOUNTER — Ambulatory Visit (INDEPENDENT_AMBULATORY_CARE_PROVIDER_SITE_OTHER): Payer: 59 | Admitting: Medical

## 2020-11-16 VITALS — BP 145/88 | HR 78 | Temp 98.1°F | Resp 18 | Ht 72.0 in | Wt 195.0 lb

## 2020-11-16 DIAGNOSIS — G5601 Carpal tunnel syndrome, right upper limb: Secondary | ICD-10-CM

## 2020-11-16 DIAGNOSIS — I10 Essential (primary) hypertension: Secondary | ICD-10-CM

## 2020-11-16 DIAGNOSIS — N529 Male erectile dysfunction, unspecified: Secondary | ICD-10-CM | POA: Diagnosis not present

## 2020-11-16 DIAGNOSIS — Z87891 Personal history of nicotine dependence: Secondary | ICD-10-CM | POA: Diagnosis not present

## 2020-11-16 MED ORDER — TADALAFIL 20 MG PO TABS: ORAL_TABLET | ORAL | 5 refills | Status: DC
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
## 2020-11-16 NOTE — Patient Instructions (Addendum)
For smoking history repeat nicotine/cotinine as your back surgeon wants current level lower.  For ED prescribing cialis refills.  Your bp is elevated today. Check your bp at home and call me with update later today. Current hangover might be effecting your bp.  Continue losartan.  Probable cts rt hand. Failed cock up splint. Refer to sports med.  Follow up in 2 months or as needed

## 2020-11-21 ENCOUNTER — Ambulatory Visit (INDEPENDENT_AMBULATORY_CARE_PROVIDER_SITE_OTHER): Payer: Medicare Other | Admitting: Family Medicine

## 2020-11-21 ENCOUNTER — Other Ambulatory Visit: Payer: Self-pay

## 2020-11-21 ENCOUNTER — Encounter: Payer: Self-pay | Admitting: Family Medicine

## 2020-11-21 ENCOUNTER — Ambulatory Visit (HOSPITAL_BASED_OUTPATIENT_CLINIC_OR_DEPARTMENT_OTHER)
Admission: RE | Admit: 2020-11-21 | Discharge: 2020-11-21 | Disposition: A | Discharge status: Home / Self Care | Payer: Medicare Other | Source: Ambulatory Visit | Attending: Family Medicine | Admitting: Family Medicine

## 2020-11-21 ENCOUNTER — Ambulatory Visit: Payer: Self-pay

## 2020-11-21 VITALS — BP 120/80 | Ht 72.0 in | Wt 195.0 lb

## 2020-11-21 DIAGNOSIS — M25531 Pain in right wrist: Secondary | ICD-10-CM

## 2020-11-21 DIAGNOSIS — G5601 Carpal tunnel syndrome, right upper limb: Secondary | ICD-10-CM | POA: Insufficient documentation

## 2020-11-21 MED ORDER — METHYLPREDNISOLONE ACETATE 40 MG/ML IJ SUSP
40.0000 mg | Freq: Once | INTRAMUSCULAR | Status: AC
  Administered 2020-11-21: 40 mg via INTRA_ARTICULAR

## 2020-11-21 NOTE — Assessment & Plan Note (Signed)
May be that the median nerve has lack of mobility within the carpal tunnel from previous surgery.  Seems less likely that the fracture is contributing.  He does feel it while leaning on the elbow also may be more of a proximal impingement. -Counseled on home exercise therapy and supportive care. -Wrist brace. -Injection. -Could consider physical therapy or nerve study.
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
## 2020-11-21 NOTE — Patient Instructions (Signed)
Good to see you Please try the brace at night.  Please try the exercises   I will call with the results from today  Please send me a message in MyChart with any questions or updates.  Please see me back in 4 weeks.   --Dr. Jordan Likes

## 2020-11-22 ENCOUNTER — Telehealth: Payer: Self-pay | Admitting: Family Medicine

## 2020-11-22 NOTE — Telephone Encounter (Signed)
Informed of results.   Myra Rude, MD [facility] 11/22/2020, 9:21 AM

## 2020-11-27 LAB — NICOTINE/COTININE METABOLITES
Cotinine: 87.4 ng/mL
Nicotine: 1.6 ng/mL

## 2020-11-28 ENCOUNTER — Telehealth: Payer: Self-pay | Admitting: *Deleted

## 2020-11-28 NOTE — Telephone Encounter (Signed)
Patient notified of results.  He stated that still no low enough.  Patient would like to come back in for a recheck in 2 weeks.    Nicotine should be under <1 and continine under <20

## 2020-11-28 NOTE — Telephone Encounter (Signed)
-----   Message from Mackie Pai, [name] sent at 11/25/2020 11:51 AM EST ----- Pt nicotine level is low but cotinine level still mild elevated. Notify pt and provide copy of study for him. Hopefully this is low enough for his surgeon to proceed back surgery.

## 2020-11-29 ENCOUNTER — Telehealth: Payer: Self-pay | Admitting: Medical

## 2020-11-29 DIAGNOSIS — Z87891 Personal history of nicotine dependence: Secondary | ICD-10-CM

## 2020-11-29 DIAGNOSIS — F17200 Nicotine dependence, unspecified, uncomplicated: Secondary | ICD-10-CM

## 2020-11-29 NOTE — Telephone Encounter (Signed)
Future nictone/cotinie test placed.

## 2020-11-29 NOTE — Telephone Encounter (Signed)
Future lab placed  

## 2020-12-05 ENCOUNTER — Other Ambulatory Visit: Payer: Self-pay | Admitting: Medical

## 2020-12-05 ENCOUNTER — Telehealth: Payer: Self-pay | Admitting: Medical

## 2020-12-05 NOTE — Telephone Encounter (Signed)
Medication: HYDROcodone-acetaminophen (NORCO) 5-325 MG tablet  buPROPion (WELLBUTRIN XL) 150 MG 24 hr tablet [[id number]]   Has the patient contacted their pharmacy? No. (If no, request that the patient contact the pharmacy for the refill.) (If yes, when and what did the pharmacy advise?)  Preferred Pharmacy (with phone number or street name):  [facility], [place] [address] [place], [place] [address]  Phone:  [phone] Fax:  [phone]  DEA #:  [id number]  [id number] Reason: --     Agent: Please be advised that RX refills may take up to 3 business days. We ask that you follow-up with your pharmacy.

## 2020-12-05 NOTE — Telephone Encounter (Signed)
Requesting: NORCO & Wellburtin Contract:n/a UDS: n/a Last Visit: 11/16/2020 Next Visit:12/12/19 Last Refill:09/2020  Please Advise

## 2020-12-06 MED ORDER — BUPROPION HCL ER (XL) 150 MG PO TB24: 150.0000 mg | ORAL_TABLET | Freq: Every day | ORAL | 3 refills | Status: DC

## 2020-12-06 MED ORDER — HYDROCODONE-ACETAMINOPHEN 5-325 MG PO TABS: 1.0000 | ORAL_TABLET | Freq: Four times a day (QID) | ORAL | 0 refills | Status: DC | PRN

## 2020-12-06 NOTE — Telephone Encounter (Signed)
Refilled norco #12. Very sparing use. Last rx lasted 2 months. He is awaiting back surgery. Refilling wellbutrin for smoking cessation.

## 2020-12-11 ENCOUNTER — Ambulatory Visit: Payer: Medicare Other | Admitting: Medical

## 2020-12-18 ENCOUNTER — Encounter: Payer: Self-pay | Admitting: Medical

## 2020-12-18 ENCOUNTER — Ambulatory Visit (INDEPENDENT_AMBULATORY_CARE_PROVIDER_SITE_OTHER): Payer: Medicare Other | Admitting: Medical

## 2020-12-18 ENCOUNTER — Other Ambulatory Visit: Payer: Self-pay

## 2020-12-18 VITALS — BP 133/80 | HR 74 | Resp 18 | Ht 72.0 in | Wt 202.0 lb

## 2020-12-18 DIAGNOSIS — N529 Male erectile dysfunction, unspecified: Secondary | ICD-10-CM | POA: Diagnosis not present

## 2020-12-18 DIAGNOSIS — I10 Essential (primary) hypertension: Secondary | ICD-10-CM | POA: Diagnosis not present

## 2020-12-18 DIAGNOSIS — M48061 Spinal stenosis, lumbar region without neurogenic claudication: Secondary | ICD-10-CM

## 2020-12-18 DIAGNOSIS — D179 Benign lipomatous neoplasm, unspecified: Secondary | ICD-10-CM

## 2020-12-18 DIAGNOSIS — G8929 Other chronic pain: Secondary | ICD-10-CM

## 2020-12-18 DIAGNOSIS — Z87891 Personal history of nicotine dependence: Secondary | ICD-10-CM | POA: Diagnosis not present

## 2020-12-18 DIAGNOSIS — Z79899 Other long term (current) drug therapy: Secondary | ICD-10-CM

## 2020-12-18 DIAGNOSIS — M5441 Lumbago with sciatica, right side: Secondary | ICD-10-CM | POA: Diagnosis not present

## 2020-12-18 MED ORDER — CYCLOBENZAPRINE HCL 10 MG PO TABS
10.0000 mg | ORAL_TABLET | Freq: Three times a day (TID) | ORAL | 0 refills | Status: DC | PRN
Start: 2020-12-18 — End: 2021-03-30

## 2020-12-18 MED ORDER — HYDROCODONE-ACETAMINOPHEN 5-325 MG PO TABS: 1.0000 | ORAL_TABLET | Freq: Four times a day (QID) | ORAL | 0 refills | Status: DC | PRN

## 2020-12-18 NOTE — Addendum Note (Signed)
Addended by: Anabel Halon on: 12/18/2020 02:48 PM   Modules accepted: Orders
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
## 2020-12-18 NOTE — Patient Instructions (Signed)
Your blood pressure/hypertension is improved on recheck.  Continue losartan 25 mg daily.  History of smoking and low level nicotine but persisting mild high level cotinine.  Will repeat nicotine/cotinine level again.  Slight high cotinine levels prohibiting your low back surgery since level below 20 required by your neurosurgeon.  If your levels are persistently high again then could refer you to a different neurosurgeon to see if they have strict low cotinine level requirements as current surgeon denies.  Chronic low back pain from spinal stenosis and herniated disc.  Had previously had you on intermittent low number Norco for pain.  Was hoping that you would get your back surgery but that has been delayed.  So placing you on contract for Norco presently to allow refills as needed.  Prescription of [place] #15 sent to your pharmacy.  Also refilling Flexeril.  Upper thoracic region does appear that you have lipoma versus sebaceous cyst.  We will go ahead and refer you to general surgeon for the area.  History of erectile dysfunction.  Refilling your Cialis.  Follow up 1 month or as needed

## 2020-12-19 ENCOUNTER — Ambulatory Visit (INDEPENDENT_AMBULATORY_CARE_PROVIDER_SITE_OTHER): Payer: Medicare Other | Admitting: Family Medicine

## 2020-12-19 ENCOUNTER — Ambulatory Visit: Payer: Self-pay

## 2020-12-19 VITALS — BP 112/84 | Ht 72.0 in | Wt 202.0 lb

## 2020-12-19 DIAGNOSIS — D171 Benign lipomatous neoplasm of skin and subcutaneous tissue of trunk: Secondary | ICD-10-CM | POA: Diagnosis not present

## 2020-12-19 DIAGNOSIS — G5601 Carpal tunnel syndrome, right upper limb: Secondary | ICD-10-CM
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
## 2020-12-19 NOTE — Assessment & Plan Note (Signed)
The mas in the center of his back appears benign on ultrasound. Well defined area overlying the thoracic spine.  - has referral to general surgery  - could consider monitoring with ultrasound.

## 2020-12-19 NOTE — Assessment & Plan Note (Signed)
Significant improvement with injection.  -Counseled on home exercise therapy and supportive care -Could consider physical therapy.

## 2020-12-19 NOTE — Patient Instructions (Signed)
Good to see you  Please send me a message in MyChart with any questions or updates.  Please see me back in 2-3 months.   --Dr. Raeford Razor

## 2020-12-20 LAB — DM TEMPLATE

## 2020-12-20 LAB — DRUG MONITORING, PANEL 8 WITH CONFIRMATION, URINE
6 Acetylmorphine: NEGATIVE ng/mL (ref <10)
Alcohol Metabolites: NEGATIVE ng/mL
Amphetamines: NEGATIVE ng/mL (ref <500)
Benzodiazepines: NEGATIVE ng/mL (ref <100)
Buprenorphine, Urine: NEGATIVE ng/mL (ref <5)
Cocaine Metabolite: NEGATIVE ng/mL (ref <150)
Codeine: NEGATIVE ng/mL (ref <50)
Creatinine: 210.9 mg/dL
Hydrocodone: 1093 ng/mL — ABNORMAL HIGH (ref <50)
Hydromorphone: 69 ng/mL — ABNORMAL HIGH (ref <50)
MDMA: NEGATIVE ng/mL (ref <500)
Marijuana Metabolite: 1356 ng/mL — ABNORMAL HIGH (ref <5)
Marijuana Metabolite: POSITIVE ng/mL — AB (ref <20)
Morphine: NEGATIVE ng/mL (ref <50)
Norhydrocodone: 977 ng/mL — ABNORMAL HIGH (ref <50)
Opiates: POSITIVE ng/mL — AB (ref <100)
Oxidant: NEGATIVE ug/mL
Oxycodone: NEGATIVE ng/mL (ref <100)
pH: 6 (ref 4.5–9.0)

## 2020-12-25 LAB — NICOTINE/COTININE METABOLITES
Cotinine: 84.8 ng/mL
Nicotine: 7.4 ng/mL

## 2021-01-01 NOTE — Progress Notes (Addendum)
Chronic Care Management Pharmacy Note  01/05/2021 Name:  Cory Benson MRN:  [id number] DOB:  October 27, 1966  Subjective: Cory Benson is an 55 y.o. year old male who is a primary patient of Cory Benson, [place].  The CCM team was consulted for assistance with disease management and care coordination needs.    Engaged with patient by telephone for follow up visit in response to provider referral for pharmacy case management and/or care coordination services.   Consent to Services:  The patient was given the following information about Chronic Care Management services today, agreed to services, and gave verbal consent: 1. CCM service includes personalized support from designated clinical staff supervised by the primary care provider, including individualized plan of care and coordination with other care providers 2. 24/7 contact phone numbers for assistance for urgent and routine care needs. 3. Service will only be billed when office clinical staff spend 20 minutes or more in a month to coordinate care. 4. Only one practitioner may furnish and bill the service in a calendar month. 5.The patient may stop CCM services at any time (effective at the end of the month) by phone call to the office staff. 6. The patient will be responsible for cost sharing (co-pay) of up to 20% of the service fee (after annual deductible is met). Patient agreed to services and consent obtained.  Patient Care Team: [name], Iris Pert as PCP - General (Internal Medicine) Edythe Clarity, [facility] (Pharmacist)  Recent office visits: 12/18/20 Harvie Heck) - no medication changes, referred for surgery for lipoma on upper thoracic region  Recent consult visits: 12/19/20 Raeford Razor, Sports medicine) - carpal tunnel improved with injection.  No medication changes  Hospital visits: None in previous 6 months  Objective:  Lab Results  Component Value Date   CREATININE 1.02 03/03/2020   BUN 13 03/03/2020   GFR 91.91 03/03/2020    NA 136 03/03/2020   K 4.3 03/03/2020   CALCIUM 8.9 03/03/2020   CO2 26 03/03/2020    Lab Results  Component Value Date/Time   HGBA1C 6.0 01/21/2020 11:25 AM   GFR 91.91 03/03/2020 11:06 AM   GFR 89.91 01/21/2020 11:25 AM    Last diabetic Eye exam: No results found for: HMDIABEYEEXA  Last diabetic Foot exam: No results found for: HMDIABFOOTEX   Lab Results  Component Value Date   CHOL 145 10/01/2019   HDL 47 10/01/2019   LDLCALC 84 10/01/2019   TRIG 63 10/01/2019   CHOLHDL 3.1 10/01/2019    Hepatic Function Latest Ref Rng & Units 03/03/2020 01/21/2020 10/01/2019  Total Protein 6.0 - 8.3 g/dL 6.9 7.8 7.0  Albumin 3.5 - 5.2 g/dL 4.0 4.2 -  AST 0 - 37 U/L 22 24 21   ALT 0 - 53 U/L 17 20 17   Alk Phosphatase 39 - 117 U/L 62 63 -  Total Bilirubin 0.2 - 1.2 mg/dL 0.5 0.6 0.4    No results found for: TSH, FREET4  CBC Latest Ref Rng & Units 01/21/2020 10/01/2019  WBC 4.0 - 10.5 K/uL 7.9 5.7  Hemoglobin 13.0 - 17.0 g/dL 14.5 13.9  Hematocrit 39.0 - 52.0 % 42.7 40.6  Platelets 150.0 - 400.0 K/uL 242.0 206    No results found for: VD25OH  Clinical ASCVD: No  The 10-year ASCVD risk score Mikey Bussing DC Jr., et al., 2013) is: 8%   Values used to calculate the score:     Age: 13 years     Sex: Male     Is Non-Hispanic African American:  Yes     Diabetic: No     Tobacco smoker: No     Systolic Blood Pressure: 778 mmHg     Is BP treated: Yes     HDL Cholesterol: 47 mg/dL     Total Cholesterol: 145 mg/dL    Depression screen [facility] 2/9 11/09/2019  Decreased Interest 0  Down, Depressed, Hopeless 0  PHQ - 2 Score 0  Altered sleeping 0  Tired, decreased energy 0  Change in appetite 0  Feeling bad or failure about yourself  0  Trouble concentrating 0  Moving slowly or fidgety/restless 0  Suicidal thoughts 0  PHQ-9 Score 0  Difficult doing work/chores Not difficult at all      Social History   Tobacco Use  Smoking Status Former Smoker   Packs/day: 0.25   Years: 15.00   Pack  years: 3.75   Types: Cigarettes   Quit date: 06/10/2020   Years since quitting: 0.5  Smokeless Tobacco Never Used   BP Readings from Last 3 Encounters:  12/19/20 112/84  12/18/20 133/80  11/21/20 120/80   Pulse Readings from Last 3 Encounters:  12/18/20 74  11/16/20 78  10/16/20 77   Wt Readings from Last 3 Encounters:  12/19/20 202 lb (91.6 kg)  12/18/20 202 lb (91.6 kg)  11/21/20 195 lb (88.5 kg)    Assessment/Interventions: Review of patient past medical history, allergies, medications, health status, including review of consultants reports, laboratory and other test data, was performed as part of comprehensive evaluation and provision of chronic care management services.   SDOH:  (Social Determinants of Health) assessments and interventions performed: No    CCM Care Plan  No Known Allergies  Medications Reviewed Today     Reviewed by Edythe Clarity, [facility] (Pharmacist) on 01/05/21 at [phone]  Med List Status: <None>   Medication Order Taking? Sig Documenting Provider Last Dose Status Informant  buPROPion (WELLBUTRIN XL) 150 MG 24 hr tablet [id number] Yes Take 1 tablet (150 mg total) by mouth daily. [name], [name], [name] Taking Active   cyclobenzaprine (FLEXERIL) 10 MG tablet [id number] Yes Take 1 tablet (10 mg total) by mouth 3 (three) times daily as needed for muscle spasms. [name], [name], [name] Taking Active   famotidine (PEPCID) 20 MG tablet [id number] Yes Take 1 tablet (20 mg total) by mouth daily. [name], [name], [name] Taking Active   gabapentin (NEURONTIN) 800 MG tablet [id number] Yes Take 1 tablet (800 mg total) by mouth 2 (two) times daily. [name], [name], [name] Taking Active   HYDROcodone-acetaminophen (NORCO) 5-325 MG tablet [id number] Yes Take 1 tablet by mouth every 6 (six) hours as needed for moderate pain or severe pain. [name], [name], [name] Taking Active   losartan (COZAAR) 25 MG tablet [id number] Yes Take 1 tablet by mouth once daily [name], [name], [name] Taking  Active   tadalafil (CIALIS) 20 MG tablet [id number] Yes 1/2-1 tab prior to sex Elise Benne Taking Active             Patient Active Problem List   Diagnosis Date Noted   Lipoma of torso 12/19/2020   Carpal tunnel syndrome on right 11/21/2020   Facet hypertrophy of cervical region 05/30/2020   Degenerative tear of medial meniscus of left knee 02/10/2020   Facet arthritis of lumbar region 12/31/2019   Spinal stenosis of lumbar region without neurogenic claudication 12/31/2019   Chronic bilateral low back pain with right-sided sciatica 11/22/2019     There is no immunization history on file for this  patient.  Conditions to be addressed/monitored:  Tobacco Use Disorder, Pre-Diabetes, Hypertension, Pain, GERD  Care Plan : General Pharmacy (Adult)  Updates made by Edythe Clarity, RPH since 01/05/2021 12:00 AM     Problem: HTn, Pre-DM, Tobacco Use, Chronic pain   Priority: High  Onset Date: 01/05/2021     Long-Range Goal: Patient-Specific Goal   Start Date: 01/05/2021  Expected End Date: 07/05/2021  This Visit's Progress: On track  Priority: High  Note:   Current Barriers:  Unable to achieve control of back pain  Patient working to lower nicotine levels so he can have surgery  Pharmacist Clinical Goal(s):  Over the next 120 days, patient will achieve adherence to monitoring guidelines and medication adherence to achieve therapeutic efficacy achieve control of lower nicotine levels to the point he is able to have surgery as evidenced by [place] Work to control pain through collaboration with PharmD and provider.    Interventions: 1:1 collaboration with [name], [name], [name] regarding development and update of comprehensive plan of care as evidenced by provider attestation and co-signature Inter-disciplinary care team collaboration (see longitudinal plan of care) Comprehensive medication review performed; medication list updated in electronic medical  record  Hypertension (BP goal <140/90) -controlled -Current treatment: Losartan 35m daily -Medications previously tried: none noted  -Current home readings: patient does not write it down, does check it  -Current dietary habits: not eating at work, was making his own breakfast at home after work - Reports he only takes blood pressure medication as needed and does not take it unless his BP is elevated from back pain. -Denies hypotensive/hypertensive symptoms -Educated on BP goals and benefits of medications for prevention of heart attack, stroke and kidney damage; Importance of home blood pressure monitoring; -Counseled to monitor BP at home a few times per week, document, and provide log at future appointments -Counseled on medication adherence Recommended continue current medication  Diabetes (A1c goal <6.5%)  Update 09/29/20 Now works at [employer] Has not been eating at work.   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 PaLarose Kellsater this month to check up on those labs -He has not smoked in "a few months" -Recommended to continue current medication   Pain (Goal:  Manage symptoms) -controlled -Current treatment  Norco 5-32530m6h prn Flexeril 52m4mn Gabapentin 800mg38m prn -Medications previously tried: none noted  -Reports pain is currently controlled -Recommended to continue current medication   Patient Goals/Self-Care Activities Over the next 90 days, patient will:  - take medications as prescribed check blood pressure 2 to 3 times per week, document, and provide at future appointments target a minimum of 150 minutes of moderate intensity exercise weekly  Follow Up Plan: The care management team will reach out to the patient again over the next 90 days.  Medication Assistance: None required.  Patient affirms current coverage meets needs.  Patient's preferred pharmacy is:  [facility], [address] [place] [address] Phone: [phone] Fax: [phone]   We discussed: Benefits of medication synchronization, packaging and delivery as well as enhanced pharmacist oversight with [facility]. Patient decided to: Continue current medication management strategy  Care Plan and Follow Up Patient Decision:  Patient agrees to Care Plan and Follow-up.  Plan: Telephone follow up appointment with care management team member scheduled for:  4 months\  Beverly Milch, PharmD Clinical Pharmacist [place] [phone]  I have personally reviewed this encounter including the documentation in this note and have collaborated with the care management provider regarding care management and care coordination activities to include development and update of the comprehensive care plan. I am certifying that I agree with the content of this note and encounter as supervising provider  Mackie Pai, [name]

## 2021-01-05 ENCOUNTER — Telehealth: Payer: Self-pay | Admitting: Medical

## 2021-01-05 ENCOUNTER — Ambulatory Visit (INDEPENDENT_AMBULATORY_CARE_PROVIDER_SITE_OTHER): Payer: Medicare Other

## 2021-01-05 DIAGNOSIS — I10 Essential (primary) hypertension: Secondary | ICD-10-CM

## 2021-01-05 DIAGNOSIS — M5441 Lumbago with sciatica, right side: Secondary | ICD-10-CM

## 2021-01-05 DIAGNOSIS — G8929 Other chronic pain: Secondary | ICD-10-CM

## 2021-01-05 DIAGNOSIS — Z87891 Personal history of nicotine dependence: Secondary | ICD-10-CM

## 2021-01-05 MED ORDER — HYDROCODONE-ACETAMINOPHEN 5-325 MG PO TABS: 1.0000 | ORAL_TABLET | Freq: Four times a day (QID) | ORAL | 0 refills | Status: DC | PRN

## 2021-01-05 NOTE — Patient Instructions (Addendum)
Visit Information  Goals Addressed            This Visit's Progress   . Manage Chronic Pain       Timeframe:  Long-Range Goal Priority:  High Start Date:  01/05/21                           Expected End Date:  07/05/21                     Follow Up Date 03/17/21   - call for medicine refill 2 or 3 days before it runs out - use ice or heat for pain relief    Why is this important?    Day-to-day life can be hard when you have chronic pain.   Pain medicine is just one piece of the treatment puzzle.   You can try these action steps to help you manage your pain.    Notes:  Continue smoking cessation so surgery can move forward.      Patient Care Plan: General Pharmacy (Adult)    Problem Identified: HTn, Pre-DM, Tobacco Use, Chronic pain   Priority: High  Onset Date: 01/05/2021    Long-Range Goal: Patient-Specific Goal   Start Date: 01/05/2021  Expected End Date: 07/05/2021  This Visit's Progress: On track  Priority: High  Note:   Current Barriers:  . Unable to achieve control of back pain  . Patient working to lower nicotine levels so he can have surgery  Pharmacist Clinical Goal(s):  Marland Kitchen Over the next 120 days, patient will achieve adherence to monitoring guidelines and medication adherence to achieve therapeutic efficacy . achieve control of lower nicotine levels to the point he is able to have surgery as evidenced by labwork . Work to control pain through collaboration with PharmD and provider.    Interventions: . 1:1 collaboration with [name], [name], [name] regarding development and update of comprehensive plan of care as evidenced by provider attestation and co-signature . Inter-disciplinary care team collaboration (see longitudinal plan of care) . Comprehensive medication review performed; medication list updated in electronic medical record  Hypertension (BP goal <140/90) -controlled -Current treatment: . Losartan 25mg  daily -Medications previously tried:  none noted  -Current home readings: patient does not write it down, does check it  -Current dietary habits: not eating at work, was making his own breakfast at home after work - Reports he only takes blood pressure medication as needed and does not take it unless his BP is elevated from back pain. -Denies hypotensive/hypertensive symptoms -Educated on BP goals and benefits of medications for prevention of heart attack, stroke and kidney damage; Importance of home blood pressure monitoring; -Counseled to monitor BP at home a few times per week, document, and provide log at future appointments -Counseled on medication adherence Recommended continue current medication  Diabetes (A1c goal <6.5%)  Update 09/29/20 Now works at [employer]. Has not been eating at work.   -controlled -Current medications: . None at this time -Medications previously tried: none noted  -Current home glucose readings - not checking  -Denies hypoglycemic/hyperglycemic symptoms -Current meal patterns: - working on limiting carbs to 45-60 per meal . 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 lunch:  . dinner: Rice, oxtails, cabbage . snacks: ocassionally cake, chips, ice cream . drinks: Lipton tea -Current exercise: minimal -Educated onA1c and blood sugar goals; -Counseled to check feet daily and get yearly eye exams -Counseled on diet and  exercise extensively Recommended to continue current medication  Tobacco use (Goal continue smoking cessation) -controlled -Current treatment   Bupropion XL 150mg  daily  -Patient is working to get his nicotine levels to a point where he can have back surgery -He has f/u appointment with Dr. Larose Kells later this month to check up on those labs -He has not smoked in "a few months" -Recommended to continue current medication   Pain (Goal: Manage symptoms) -controlled -Current treatment  . Norco 5-325mg  q6h prn . Flexeril 10mg  prn . Gabapentin 800mg   bid prn -Medications previously tried: none noted  -Reports pain is currently controlled -Recommended to continue current medication   Patient Goals/Self-Care Activities . Over the next 90 days, patient will:  - take medications as prescribed check blood pressure 2 to 3 times per week, document, and provide at future appointments target a minimum of 150 minutes of moderate intensity exercise weekly  Follow Up Plan: The care management team will reach out to the patient again over the next 90 days.          The patient verbalized understanding of instructions, educational materials, and care plan provided today and agreed to receive a mailed copy of patient instructions, educational materials, and care plan.  Telephone follow up appointment with pharmacy team member scheduled for: 4 months  Edythe Clarity, [facility]  [email or web address]">  Managing Chronic Back Pain Chronic back pain is back pain that lasts for 12 weeks or longer. It often affects the lower back. Back pain may feel like a muscle ache or a sharp, stabbing pain. It can be mild, moderate, or severe. If you have been diagnosed with chronic back pain, there are things you can do to manage your symptoms. You may have to try different things to see what works best for you. Your health care provider may also give you specific instructions. How to manage lifestyle changes Treating chronic back pain often starts with rest and pain relief, followed by exercises to restore movement and strength to your back (physical therapy). You may need surgery if other treatments do not help, or if your pain is caused by a condition or an injury. Follow your treatment plan as told by your health care provider. This may include:  Relaxation techniques.  Talk therapy or counseling with a mental health specialist. A form of talk therapy called cognitive behavioral therapy (CBT) can be especially helpful. This therapy helps you  set goals and follow up on the changes that you make.  Acupuncture or massage therapy.  Local electrical stimulation.  Injections. These deliver numbing or pain-relieving medicines into your spine or the area of pain. How to recognize changes in your chronic back pain Your condition may improve with treatment. However, back pain may not go away or may get worse over time. Watch your symptoms carefully and let your health care provider know if your symptoms get worse or do not improve. Your back pain may be getting worse if you have:  Pain that begins to cause problems with posture.  Pain that gets worse when you are sitting, standing, walking, bending, or lifting.  Pain that affects you while you are active, or at rest, or both.  Pain that eventually makes it hard to move around (limits mobility).  Pain that occurs with fever, weight loss, or difficulty urinating.  Pain that causes numbness and tingling. How to use body mechanics and posture to help with pain Healthy body mechanics and good posture can help to relieve stress  on your back. Body mechanics refers to the movements and positions of your body during your daily activities. Posture is part of body mechanics. Good posture means:  Your spine is in its natural S-curve, or neutral, position.  Your shoulders are pulled back slightly.  Your head is not tipped forward. Follow these guidelines to improve your posture and body mechanics in your everyday activities. Standing  When standing, keep your spine neutral and your feet about hip-width apart. Keep your knees slightly bent. Your ears, shoulders, and hips should line up.  When you do a task in which you stand in one place for a long time, place one foot on a stable object that is 2-4 inches (5-10 cm) high, such as a footstool. This helps keep your spine neutral.   Sitting  When sitting, keep your spine neutral and your feet flat on the floor. Use a footrest, if necessary,  and keep your thighs parallel to the floor. Avoid rounding your shoulders, and avoid tilting your head forward.  When working at a desk or a computer, keep your desk at a height where your hands are slightly lower than your elbows. Slide your chair under your desk so you are close enough to maintain good posture.  When working at a computer, place your monitor at a height where you are looking straight ahead and you do not have to tilt your head forward or downward to view the screen.   Lifting  Keep your feet at least shoulder-width apart and tighten the muscles of your abdomen.  Bend your knees and hips and keep your spine neutral. Be sure to lift using the strength of your legs, not your back. Do not lock your knees straight out.  Always ask for help to lift heavy or awkward objects.   Resting  When lying down and resting, avoid positions that are most painful.  If you have pain with activities such as sitting, bending, stooping, or squatting, lie in a position in which your body does not bend very much. For example, avoid curling up on your side with your arms and knees near your chest (fetal position).  If you have pain with activities such as standing for a long time or reaching with your arms, lie with your spine in a neutral position and bend your knees slightly. Try: ? Lying on your side with a pillow between your knees. ? Lying on your back with a pillow under your knees.   Follow these instructions at home: Medicines  Treatment may include over-the-counter or prescription medicines for pain and inflammation that are taken by mouth or applied to the skin. Another treatment may include muscle relaxants. Take over-the-counter and prescription medicines only as told by your health care provider.  Ask your health care provider if the medicine prescribed to you: ? Requires you to avoid driving or using machinery. ? Can cause constipation. You may need to take these actions to prevent  or treat constipation:  Drink enough fluid to keep your urine pale yellow.  Take over-the-counter or prescription medicines.  Eat foods that are high in fiber, such as beans, whole grains, and fresh fruits and vegetables.  Limit foods that are high in fat and processed sugars, such as fried or sweet foods. Lifestyle  Do not use any products that contain nicotine or tobacco, such as cigarettes, e-cigarettes, and chewing tobacco. If you need help quitting, ask your health care provider.  Eat a healthy diet that includes foods such as  vegetables, fruits, fish, and lean meats.  Work with your health care provider to achieve or maintain a healthy weight. General instructions  Get regular exercise as told. Exercise improves flexibility and strength.  If physical therapy was prescribed, do exercises as told by your health care provider.  Use ice or heat therapy as told by your health care provider.  Keep all follow-up visits as told by your health care provider. This is important. Where can I get support? Consider joining a support group for people managing chronic back pain. Ask your health care provider about support groups in your area. You can also find online and in-person support groups through:  The American Chronic Pain Association: theacpa.org  Pain Connection Program: painconnection.org Contact a health care provider if:  You have pain that is not relieved with rest or medicine.  Your pain gets worse, or you have new pain.  You have a fever.  You have rapid weight loss.  You have trouble doing your normal activities. Get help right away if:  You have weakness or numbness in one or both of your legs or feet.  You have trouble controlling your bladder or your bowels.  You have severe back pain and have any of the following: ? Nausea or vomiting. ? Abdominal pain. ? Shortness of breath or you faint. Summary  Chronic back pain is often treated with rest, pain  relief, and physical therapy.  Talk therapy, acupuncture, massage, and local electrical stimulation may help.  Follow your treatment plan as told by your health care provider.  Joining a support group may help you manage chronic back pain. This information is not intended to replace advice given to you by your health care provider. Make sure you discuss any questions you have with your health care provider. Document Revised: 12/16/2019 Document Reviewed: 08/24/2019 Elsevier Patient Education  [place].

## 2021-01-05 NOTE — Telephone Encounter (Signed)
Medication: HYDROcodone-acetaminophen (NORCO) 5-325 MG tablet [[id number]]      Has the patient contacted their pharmacy?  (If no, request that the patient contact the pharmacy for the refill.) (If yes, when and what did the pharmacy advise?)     Preferred Pharmacy (with phone number or street name): [facility], [address], [place] [address]  Phone:  [phone] Fax:  [phone]      Agent: Please be advised that RX refills may take up to 3 business days. We ask that you follow-up with your pharmacy.

## 2021-01-05 NOTE — Telephone Encounter (Addendum)
Requesting: Norco Contract:12/22/2020 UDS:12/18/2020 Last Visit:12/18/2020 Next Visit:01/15/2021 Last Refill:12/18/2020  Please Advise  Rx norco sent to pt pharmacy.  Mackie Pai, [name]

## 2021-01-15 ENCOUNTER — Ambulatory Visit (INDEPENDENT_AMBULATORY_CARE_PROVIDER_SITE_OTHER): Payer: Medicare Other | Admitting: Medical

## 2021-01-15 ENCOUNTER — Other Ambulatory Visit: Payer: Self-pay | Admitting: Medical

## 2021-01-15 ENCOUNTER — Encounter: Payer: Self-pay | Admitting: Medical

## 2021-01-15 ENCOUNTER — Other Ambulatory Visit: Payer: Self-pay

## 2021-01-15 VITALS — BP 130/80 | HR 84 | Temp 98.2°F | Resp 20 | Ht 72.0 in | Wt 200.4 lb

## 2021-01-15 DIAGNOSIS — M545 Low back pain, unspecified: Secondary | ICD-10-CM

## 2021-01-15 DIAGNOSIS — I10 Essential (primary) hypertension: Secondary | ICD-10-CM

## 2021-01-15 DIAGNOSIS — M48061 Spinal stenosis, lumbar region without neurogenic claudication: Secondary | ICD-10-CM

## 2021-01-15 DIAGNOSIS — D179 Benign lipomatous neoplasm, unspecified: Secondary | ICD-10-CM | POA: Diagnosis not present

## 2021-01-15 DIAGNOSIS — G8929 Other chronic pain: Secondary | ICD-10-CM

## 2021-01-15 DIAGNOSIS — Z87891 Personal history of nicotine dependence: Secondary | ICD-10-CM

## 2021-01-15 MED ORDER — BUPROPION HCL ER (XL) 150 MG PO TB24: 150.0000 mg | ORAL_TABLET | Freq: Every day | ORAL | 3 refills | Status: DC

## 2021-01-15 MED ORDER — LOSARTAN POTASSIUM 25 MG PO TABS: 25.0000 mg | ORAL_TABLET | Freq: Every day | ORAL | 1 refills | Status: DC

## 2021-01-15 NOTE — Patient Instructions (Addendum)
For chronic back pain/spinal stenosis,  I did go ahead and refer to new neurosurgeon as discussed.  For thoracic lipoma follow mri of spine and can forward info to neurosurgeon to see if they would remove since overlying spine.  For htn refilled losartan today. bp well controlled.  For hx of smoking and attempting to quite refilled wellbutrin. Will get cotinine/nicotine level today.   Follow up 4-6 weeks or as needed
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
## 2021-01-15 NOTE — Addendum Note (Signed)
Addended by: Anabel Halon on: 01/15/2021 02:48 PM   Modules accepted: Orders

## 2021-01-16 ENCOUNTER — Ambulatory Visit: Payer: 59 | Admitting: Medical

## 2021-01-18 LAB — NICOTINE/COTININE METABOLITES
Cotinine: 12.2 ng/mL
Nicotine: <1 ng/mL

## 2021-01-22 ENCOUNTER — Other Ambulatory Visit: Payer: Self-pay | Admitting: General Surgery

## 2021-01-22 DIAGNOSIS — R222 Localized swelling, mass and lump, trunk: Secondary | ICD-10-CM

## 2021-02-06 ENCOUNTER — Ambulatory Visit: Payer: Medicare Other | Admitting: Medical

## 2021-02-08 ENCOUNTER — Other Ambulatory Visit: Payer: Self-pay

## 2021-02-08 ENCOUNTER — Other Ambulatory Visit: Payer: Self-pay | Admitting: Medical

## 2021-02-08 ENCOUNTER — Ambulatory Visit
Admission: RE | Admit: 2021-02-08 | Discharge: 2021-02-08 | Disposition: A | Discharge status: Home / Self Care | Payer: Medicare Other | Source: Ambulatory Visit | Attending: General Surgery | Admitting: General Surgery

## 2021-02-08 ENCOUNTER — Other Ambulatory Visit: Payer: Medicare Other

## 2021-02-08 DIAGNOSIS — R222 Localized swelling, mass and lump, trunk: Secondary | ICD-10-CM

## 2021-02-08 MED ORDER — GADOBENATE DIMEGLUMINE 529 MG/ML IV SOLN
18.0000 mL | Freq: Once | INTRAVENOUS | Status: AC | PRN
  Administered 2021-02-08: 18 mL via INTRAVENOUS

## 2021-02-09 ENCOUNTER — Other Ambulatory Visit: Payer: Self-pay

## 2021-02-09 ENCOUNTER — Ambulatory Visit (INDEPENDENT_AMBULATORY_CARE_PROVIDER_SITE_OTHER): Payer: Medicare Other | Admitting: Medical

## 2021-02-09 VITALS — BP 136/85 | HR 74 | Resp 18 | Ht 73.0 in | Wt 204.0 lb

## 2021-02-09 DIAGNOSIS — M48061 Spinal stenosis, lumbar region without neurogenic claudication: Secondary | ICD-10-CM

## 2021-02-09 DIAGNOSIS — M545 Low back pain, unspecified: Secondary | ICD-10-CM

## 2021-02-09 DIAGNOSIS — D179 Benign lipomatous neoplasm, unspecified: Secondary | ICD-10-CM | POA: Diagnosis not present

## 2021-02-09 DIAGNOSIS — I10 Essential (primary) hypertension: Secondary | ICD-10-CM | POA: Diagnosis not present

## 2021-02-09 DIAGNOSIS — G8929 Other chronic pain: Secondary | ICD-10-CM

## 2021-02-09 MED ORDER — TADALAFIL 20 MG PO TABS: ORAL_TABLET | ORAL | 3 refills | Status: DC
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
## 2021-02-09 NOTE — Patient Instructions (Addendum)
For your hx of back pain and stenosis glad to hear surgeon agreeing to do surgery.   Continue to not smoke.  Reviewed most recent MRI today.  BP level reasonably controlled. Continue losartan 25 mg daily.  For ED will refill your cialis.  Talk to staff about getting July 2021 from our office. Call medical records if necessary and ask to expidite.   Follow up in 2 months or as needed

## 2021-02-09 NOTE — Addendum Note (Signed)
Addended by: Anabel Halon on: 02/09/2021 02:13 PM   Modules accepted: Orders

## 2021-02-19 ENCOUNTER — Ambulatory Visit: Payer: Medicare Other | Admitting: Medical

## 2021-02-26 ENCOUNTER — Ambulatory Visit: Payer: Medicare Other | Admitting: Medical

## 2021-03-14 IMAGING — DX DG LUMBAR SPINE 2-3V
3 series · 3 of 3 positions shown · non-contrast
Comparison: None.

CLINICAL DATA: Chronic low back pain

EXAM:
LUMBAR SPINE - 2-3 VIEW

[l-spine ap]
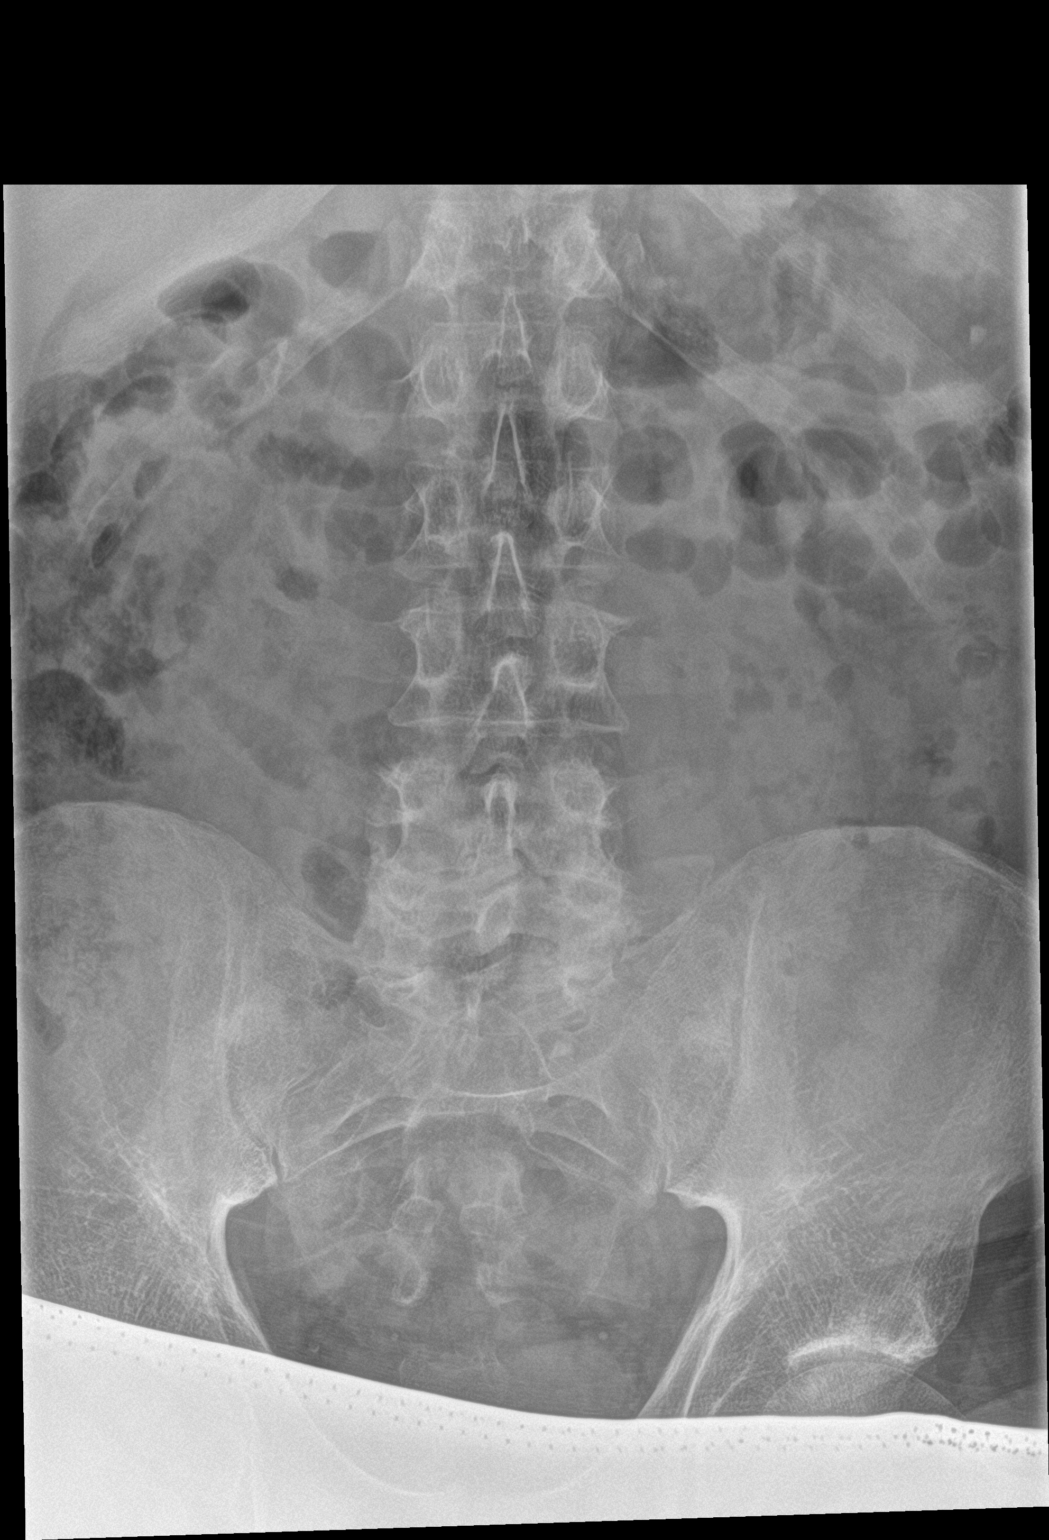

[l-spine lat]
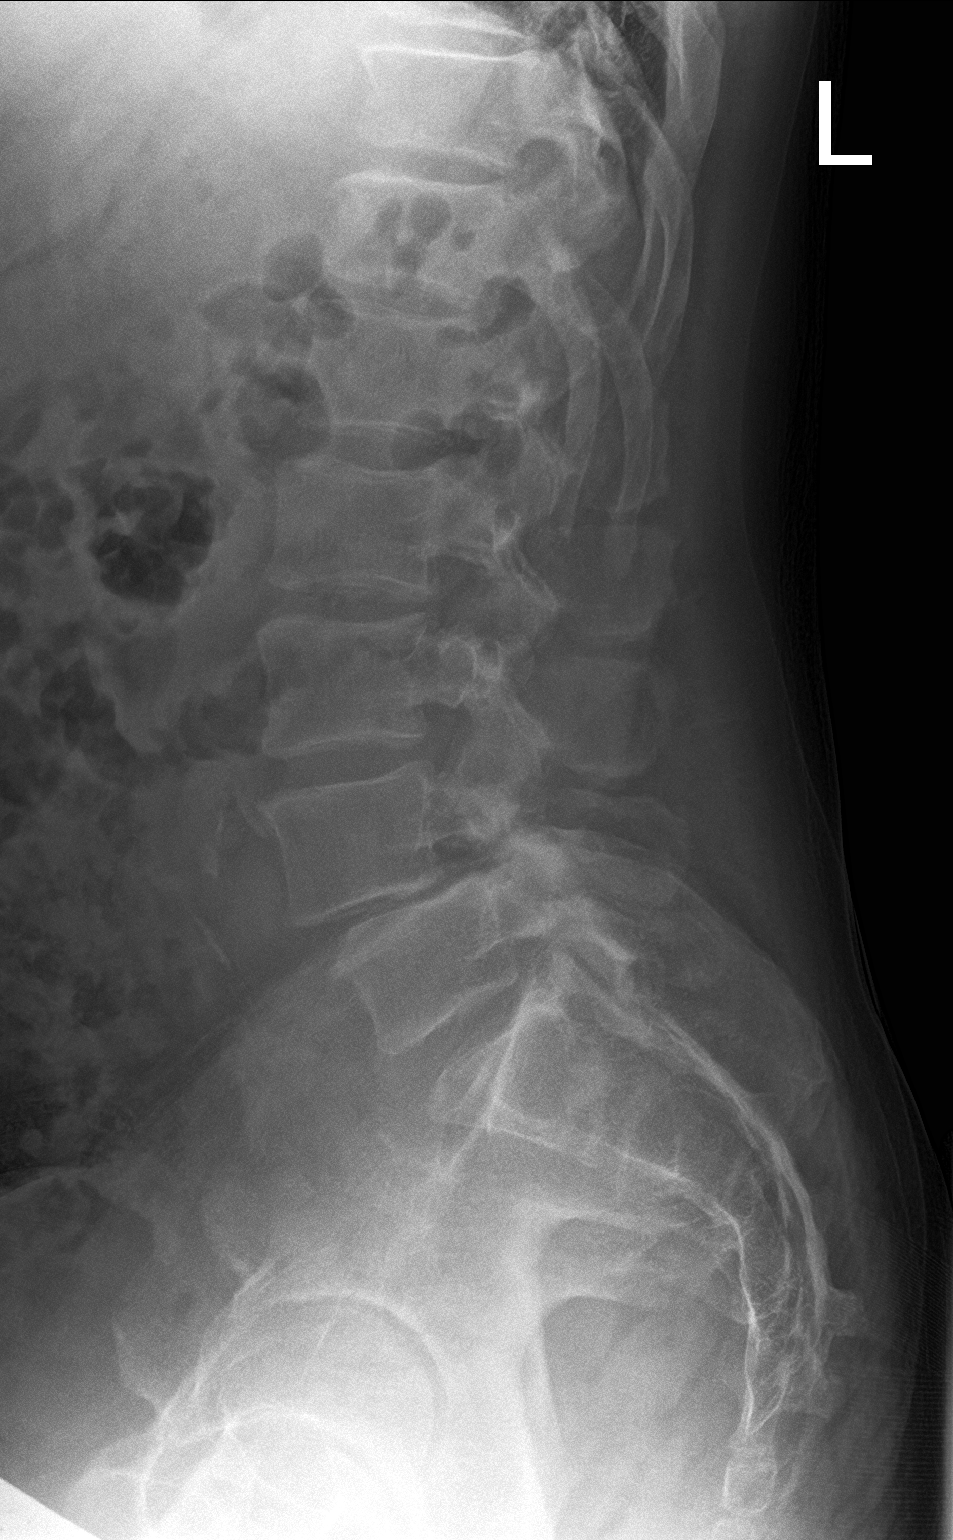

[l-spine spot]
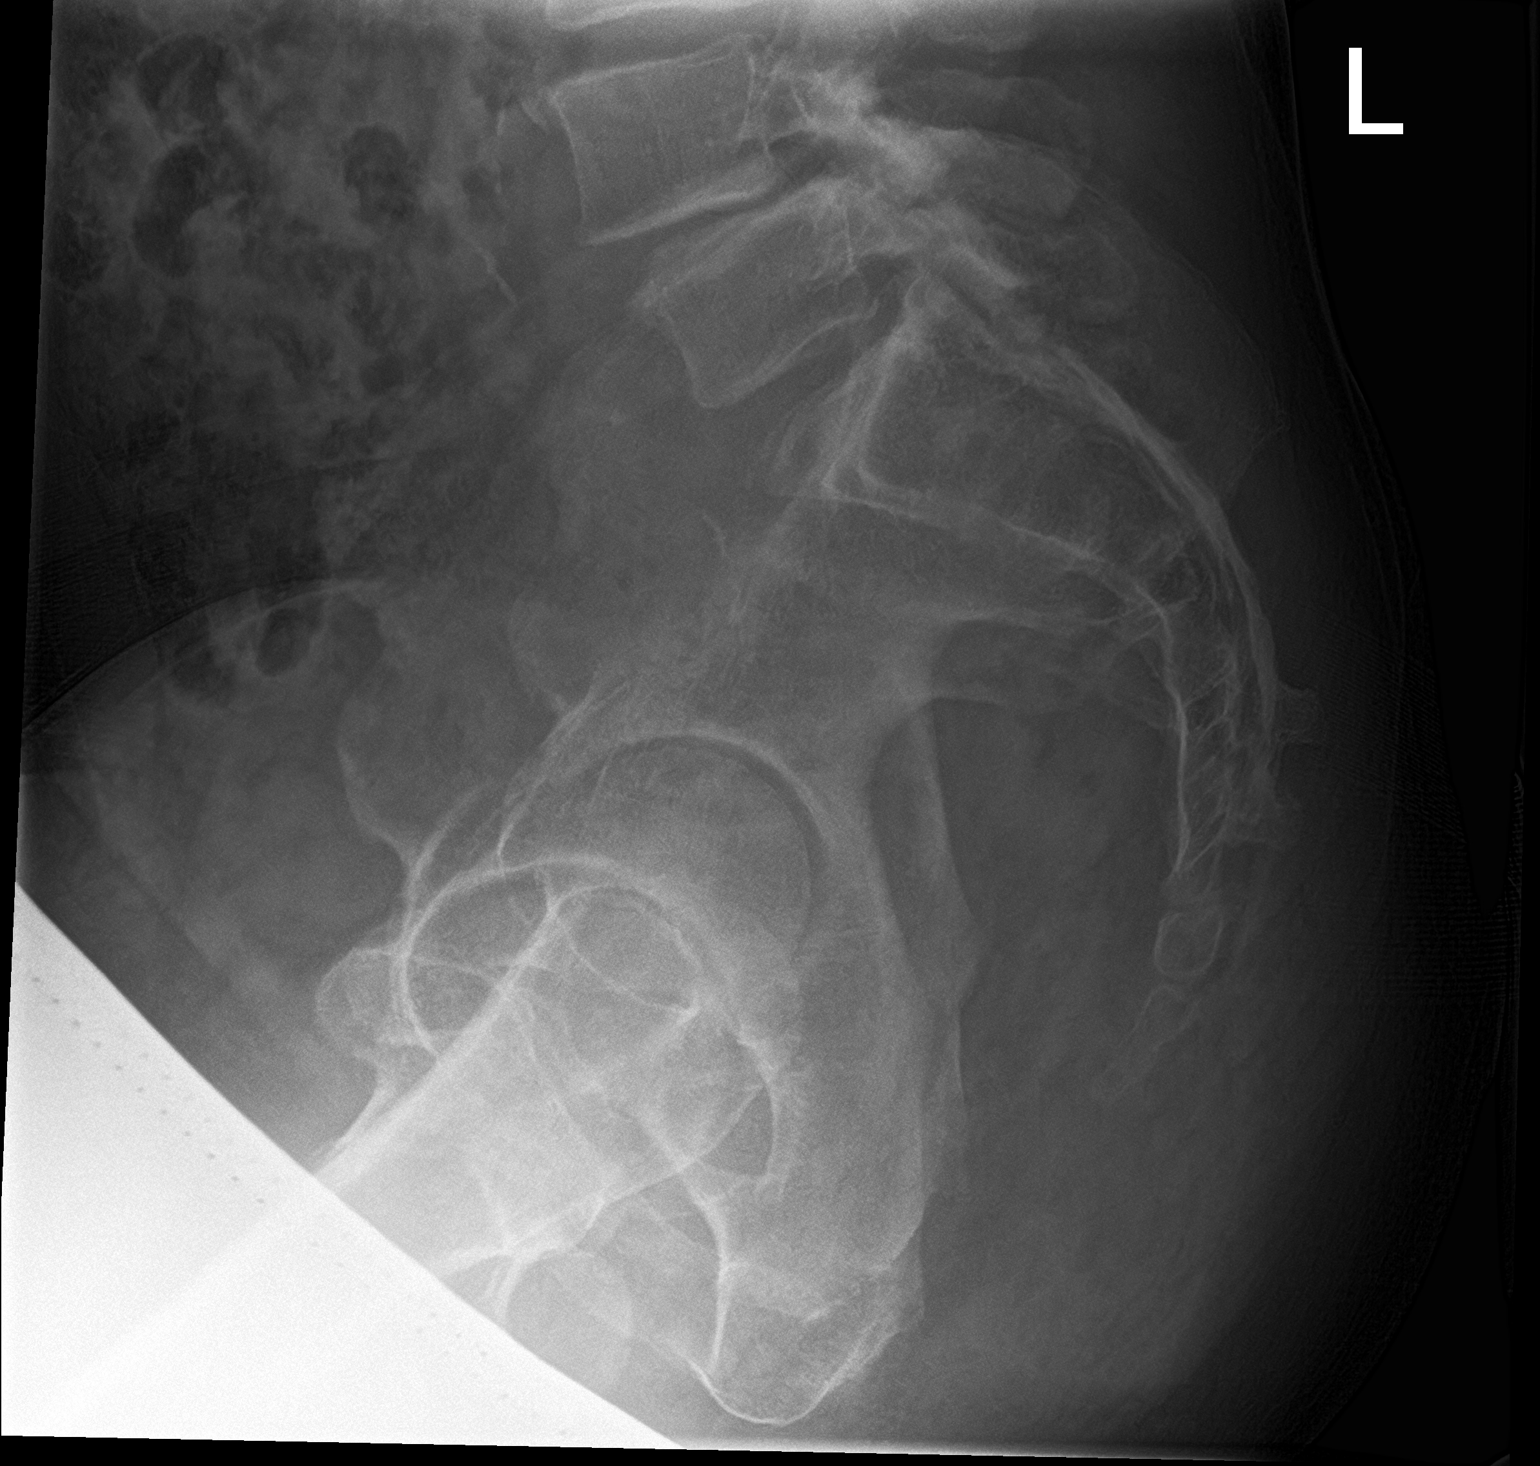

[3 of 3 positions shown; findings below may reference images not displayed]

FINDINGS: Advanced degenerative disc disease at L4-5 with disc space
narrowing. Moderate to advanced degenerative facet disease
diffusely. There appear to be L4 pars defects. 10 mm of
anterolisthesis of L4 on L5. No acute fracture. SI joints symmetric
and unremarkable.
IMPRESSION: Moderate to advanced degenerative disc and facet disease as above.
Probable L4 pars defects. Grade 1 anterolisthesis of L4 on L5.

## 2021-03-19 ENCOUNTER — Ambulatory Visit: Payer: Medicare Other | Admitting: Family Medicine

## 2021-03-29 ENCOUNTER — Ambulatory Visit (INDEPENDENT_AMBULATORY_CARE_PROVIDER_SITE_OTHER): Payer: Medicare Other | Admitting: Medical

## 2021-03-29 ENCOUNTER — Encounter: Payer: Self-pay | Admitting: Medical

## 2021-03-29 ENCOUNTER — Other Ambulatory Visit: Payer: Self-pay

## 2021-03-29 VITALS — BP 130/89 | HR 100 | Resp 20 | Ht 73.0 in | Wt 198.6 lb

## 2021-03-29 DIAGNOSIS — M48061 Spinal stenosis, lumbar region without neurogenic claudication: Secondary | ICD-10-CM | POA: Diagnosis not present

## 2021-03-29 DIAGNOSIS — M545 Low back pain, unspecified: Secondary | ICD-10-CM

## 2021-03-29 DIAGNOSIS — G8929 Other chronic pain: Secondary | ICD-10-CM | POA: Diagnosis not present

## 2021-03-29 DIAGNOSIS — I10 Essential (primary) hypertension: Secondary | ICD-10-CM

## 2021-03-29 NOTE — Patient Instructions (Signed)
Glad to hear that your pain is much/improved since having surgery for spinal stenosis.  Also glad that they removed a large lipoma as well.  The surgical wound sites look clean and not infected.  Replaced bandages/Band-Aids today.  Follow-up with neurosurgeon next week.  Your blood pressure is presently recently controlled.  Continue on losartan 25 mg daily.  Glad to hear that you had quit smoking.  Strongly advised not to restart.  Follow-up in 6 weeks or as needed.
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
## 2021-03-30 ENCOUNTER — Other Ambulatory Visit: Payer: Self-pay | Admitting: Medical

## 2021-03-30 ENCOUNTER — Telehealth: Payer: Self-pay | Admitting: Medical

## 2021-03-30 NOTE — Telephone Encounter (Signed)
Rx sent 

## 2021-03-30 NOTE — Telephone Encounter (Signed)
Medication: cyclobenzaprine (FLEXERIL) 10 MG tablet [[id number]]    Has the patient contacted their pharmacy? no (If no, request that the patient contact the pharmacy for the refill.) (If yes, when and what did the pharmacy advise?)    Preferred Pharmacy (with phone number or street name):   [facility], [place] Phone:  [phone]  Fax:  [phone]         Agent: Please be advised that RX refills may take up to 3 business days. We ask that you follow-up with your pharmacy.

## 2021-04-05 ENCOUNTER — Other Ambulatory Visit: Payer: Self-pay

## 2021-04-05 ENCOUNTER — Encounter: Payer: Self-pay | Admitting: Family Medicine

## 2021-04-05 ENCOUNTER — Ambulatory Visit (INDEPENDENT_AMBULATORY_CARE_PROVIDER_SITE_OTHER): Payer: Medicare Other | Admitting: Family Medicine

## 2021-04-05 DIAGNOSIS — M48061 Spinal stenosis, lumbar region without neurogenic claudication: Secondary | ICD-10-CM | POA: Diagnosis not present
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
## 2021-04-05 NOTE — Assessment & Plan Note (Signed)
Has had improvement of his symptoms status post fusion. -Counseled on home exercise therapy and supportive care. -Could consider physical therapy at this location.

## 2021-04-11 ENCOUNTER — Ambulatory Visit: Payer: Medicare Other | Admitting: Medical

## 2021-04-13 ENCOUNTER — Telehealth: Payer: Self-pay | Admitting: Medical

## 2021-04-13 MED ORDER — CYCLOBENZAPRINE HCL 10 MG PO TABS: 1.0000 | ORAL_TABLET | Freq: Three times a day (TID) | ORAL | 0 refills | Status: DC | PRN

## 2021-04-13 NOTE — Telephone Encounter (Signed)
Rx sent 

## 2021-04-13 NOTE — Telephone Encounter (Signed)
Medication: cyclobenzaprine (FLEXERIL) 10 MG tablet [[id number]]     Has the patient contacted their pharmacy? no (If no, request that the patient contact the pharmacy for the refill.) (If yes, when and what did the pharmacy advise?)    Preferred Pharmacy (with phone number or street name): [facility], [facility], [place] [address]  Phone:  [phone] Fax:  [phone]    Agent: Please be advised that RX refills may take up to 3 business days. We ask that you follow-up with your pharmacy.

## 2021-04-24 ENCOUNTER — Other Ambulatory Visit: Payer: Self-pay | Admitting: Medical

## 2021-05-10 ENCOUNTER — Telehealth: Payer: Medicare Other

## 2021-05-11 ENCOUNTER — Ambulatory Visit: Payer: Medicare Other | Admitting: Medical

## 2021-05-21 ENCOUNTER — Other Ambulatory Visit: Payer: Self-pay | Admitting: Medical

## 2021-05-22 ENCOUNTER — Ambulatory Visit (INDEPENDENT_AMBULATORY_CARE_PROVIDER_SITE_OTHER): Payer: Medicare Other | Admitting: Medical

## 2021-05-22 ENCOUNTER — Other Ambulatory Visit: Payer: Self-pay

## 2021-05-22 VITALS — BP 137/90 | HR 92 | Resp 18 | Ht 73.0 in | Wt 204.0 lb

## 2021-05-22 DIAGNOSIS — I10 Essential (primary) hypertension: Secondary | ICD-10-CM

## 2021-05-22 DIAGNOSIS — M545 Low back pain, unspecified: Secondary | ICD-10-CM | POA: Diagnosis not present

## 2021-05-22 DIAGNOSIS — N529 Male erectile dysfunction, unspecified: Secondary | ICD-10-CM | POA: Diagnosis not present

## 2021-05-22 DIAGNOSIS — G8929 Other chronic pain: Secondary | ICD-10-CM

## 2021-05-22 MED ORDER — CYCLOBENZAPRINE HCL 10 MG PO TABS: ORAL_TABLET | ORAL | 0 refills | Status: DC

## 2021-05-22 MED ORDER — TADALAFIL 20 MG PO TABS: ORAL_TABLET | ORAL | 3 refills | Status: DC
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
## 2021-05-22 NOTE — Patient Instructions (Addendum)
For back pain and muscles spasms continue current regimen. Continue flexeril, gabapentin and oxy ir. Rx advisement given on flexeril. Oxy ir will be filled by surgeon.  For ED refilled your cialis.  For htn continue losartan 25 mg daily.  To continue not to smoke recommend continue wellbutrin. Also may get benefit of wt control or wt loss. Some weight gain since surgery.  Follow up in 2 months or as needed.  Follow surgeon recommendations on when you can start working out again. Not to work out before.

## 2021-06-19 ENCOUNTER — Other Ambulatory Visit: Payer: Self-pay | Admitting: Medical

## 2021-06-19 ENCOUNTER — Telehealth: Payer: Medicare Other

## 2021-06-20 ENCOUNTER — Other Ambulatory Visit: Payer: Self-pay | Admitting: Medical

## 2021-06-25 ENCOUNTER — Telehealth: Payer: Self-pay | Admitting: *Deleted

## 2021-06-25 NOTE — Telephone Encounter (Signed)
Prior [employer]. Patient Name: Malak Tremel Patient DOB: 12-12-1965 Patient ID: [id number] Status of Request: Deny Medication Name: Tadalafil Tab '20mg'$  GPI/NDC: [id number] Decision Notes: Drugs when used for the treatment of sexual dysfunction are excluded from coverage under Medicare rules. Please refer to your Evidence of Coverage (EOC) section that references Part D drug coverage in your pharmacy plan documents for more information. Reviewed by: [id number], R.Ph.

## 2021-06-27 ENCOUNTER — Ambulatory Visit (INDEPENDENT_AMBULATORY_CARE_PROVIDER_SITE_OTHER): Payer: Medicare Other | Admitting: Medical

## 2021-06-27 ENCOUNTER — Other Ambulatory Visit: Payer: Self-pay

## 2021-06-27 VITALS — BP 138/99 | HR 67 | Resp 18 | Ht 73.0 in | Wt 199.8 lb

## 2021-06-27 DIAGNOSIS — Z23 Encounter for immunization: Secondary | ICD-10-CM | POA: Diagnosis not present

## 2021-06-27 DIAGNOSIS — Z0000 Encounter for general adult medical examination without abnormal findings: Secondary | ICD-10-CM

## 2021-06-27 DIAGNOSIS — M25559 Pain in unspecified hip: Secondary | ICD-10-CM

## 2021-06-27 DIAGNOSIS — Z87891 Personal history of nicotine dependence: Secondary | ICD-10-CM

## 2021-06-27 DIAGNOSIS — G8929 Other chronic pain: Secondary | ICD-10-CM

## 2021-06-27 DIAGNOSIS — R059 Cough, unspecified: Secondary | ICD-10-CM | POA: Diagnosis not present

## 2021-06-27 DIAGNOSIS — M5441 Lumbago with sciatica, right side: Secondary | ICD-10-CM

## 2021-06-27 DIAGNOSIS — Z79899 Other long term (current) drug therapy: Secondary | ICD-10-CM

## 2021-06-27 LAB — LIPID PANEL
Cholesterol: 163 mg/dL (ref 0–200)
HDL: 47 mg/dL (ref >39.00)
LDL Cholesterol: 103 mg/dL — ABNORMAL HIGH (ref 0–99)
NonHDL: 115.81
Total CHOL/HDL Ratio: 3
Triglycerides: 66 mg/dL (ref 0.0–149.0)
VLDL: 13.2 mg/dL (ref 0.0–40.0)

## 2021-06-27 LAB — CBC WITH DIFFERENTIAL/PLATELET
Basophils Absolute: 0.1 10*3/uL (ref 0.0–0.1)
Basophils Relative: 1.1 % (ref 0.0–3.0)
Eosinophils Absolute: 0.2 10*3/uL (ref 0.0–0.7)
Eosinophils Relative: 3.5 % (ref 0.0–5.0)
HCT: 40.3 % (ref 39.0–52.0)
Hemoglobin: 13.4 g/dL (ref 13.0–17.0)
Lymphocytes Relative: 42.5 % (ref 12.0–46.0)
Lymphs Abs: 2.1 10*3/uL (ref 0.7–4.0)
MCHC: 33.3 g/dL (ref 30.0–36.0)
MCV: 92.3 fl (ref 78.0–100.0)
Monocytes Absolute: 0.5 10*3/uL (ref 0.1–1.0)
Monocytes Relative: 9.8 % (ref 3.0–12.0)
Neutro Abs: 2.1 10*3/uL (ref 1.4–7.7)
Neutrophils Relative %: 43.1 % (ref 43.0–77.0)
Platelets: 196 10*3/uL (ref 150.0–400.0)
RBC: 4.36 Mil/uL (ref 4.22–5.81)
RDW: 15.1 % (ref 11.5–15.5)
WBC: 4.8 10*3/uL (ref 4.0–10.5)

## 2021-06-27 LAB — COMPREHENSIVE METABOLIC PANEL
ALT: 25 U/L (ref 0–53)
AST: 26 U/L (ref 0–37)
Albumin: 4.1 g/dL (ref 3.5–5.2)
Alkaline Phosphatase: 64 U/L (ref 39–117)
BUN: 11 mg/dL (ref 6–23)
CO2: 26 mEq/L (ref 19–32)
Calcium: 9.2 mg/dL (ref 8.4–10.5)
Chloride: 103 mEq/L (ref 96–112)
Creatinine, Ser: 1.02 mg/dL (ref 0.40–1.50)
GFR: 82.54 mL/min (ref >60.00)
Glucose, Bld: 87 mg/dL (ref 70–99)
Potassium: 4.1 mEq/L (ref 3.5–5.1)
Sodium: 137 mEq/L (ref 135–145)
Total Bilirubin: 0.4 mg/dL (ref 0.2–1.2)
Total Protein: 7.3 g/dL (ref 6.0–8.3)

## 2021-06-27 MED ORDER — OXYCODONE HCL 5 MG PO TABS: ORAL_TABLET | ORAL | 0 refills | Status: DC

## 2021-06-27 NOTE — Patient Instructions (Addendum)
For you wellness exam today I have ordered cbc, cmp and  lipid panel.  Vaccine given today tdap. Consider pcv 20 pneumonia vaccine due to hx of smoking.  Recommend exercise and healthy diet.  We will let you know lab results as they come in.  Follow up date appointment will be determined after lab review.     For chronic back pain rx ox ir limited amount. Plan to taper off and eventuallly rx norco. If pain high level persists refer to pain management. Uds and contract today.  Preventive Care 56-6 Years Old, Male Preventive care refers to lifestyle choices and visits with your health care provider that can promote health and wellness. This includes: A yearly physical exam. This is also called an annual wellness visit. Regular dental and eye exams. Immunizations. Screening for certain conditions. Healthy lifestyle choices, such as: Eating a healthy diet. Getting regular exercise. Not using drugs or products that contain nicotine and tobacco. Limiting alcohol use. What can I expect for my preventive care visit? Physical exam Your health care provider will check your: Height and weight. These may be used to calculate your BMI (body mass index). BMI is a measurement that tells if you are at a healthy weight. Heart rate and blood pressure. Body temperature. Skin for abnormal spots. Counseling Your health care provider may ask you questions about your: Past medical problems. Family's medical history. Alcohol, tobacco, and drug use. Emotional well-being. Home life and relationship well-being. Sexual activity. Diet, exercise, and sleep habits. Work and work Statistician. Access to firearms. What immunizations do I need?  Vaccines are usually given at various ages, according to a schedule. Your health care provider will recommend vaccines for you based on your age, medicalhistory, and lifestyle or other factors, such as travel or where you work. What tests do I need? Blood  tests Lipid and cholesterol levels. These may be checked every 5 years, or more often if you are over 56 years old. Hepatitis C test. Hepatitis B test. Screening Lung cancer screening. You may have this screening every year starting at age 56 if you have a 30-pack-year history of smoking and currently smoke or have quit within the past 15 years. Prostate cancer screening. Recommendations will vary depending on your family history and other risks. Genital exam to check for testicular cancer or hernias. Colorectal cancer screening. All adults should have this screening starting at age 56 and continuing until age 56. Your health care provider may recommend screening at age 56 if you are at increased risk. You will have tests every 1-10 years, depending on your results and the type of screening test. Diabetes screening. This is done by checking your blood sugar (glucose) after you have not eaten for a while (fasting). You may have this done every 1-3 years. STD (sexually transmitted disease) testing, if you are at risk. Follow these instructions at home: Eating and drinking  Eat a diet that includes fresh fruits and vegetables, whole grains, lean protein, and low-fat dairy products. Take vitamin and mineral supplements as recommended by your health care provider. Do not drink alcohol if your health care provider tells you not to drink. If you drink alcohol: Limit how much you have to 0-2 drinks a day. Be aware of how much alcohol is in your drink. In the [place] one drink equals one 12 oz bottle of beer (355 mL), one 5 oz glass of wine (148 mL), or one 1 oz glass of hard liquor (44 mL).  Lifestyle  Take daily care of your teeth and gums. Brush your teeth every morning and night with fluoride toothpaste. Floss one time each day. Stay active. Exercise for at least 30 minutes 5 or more days each week. Do not use any products that contain nicotine or tobacco, such as cigarettes, e-cigarettes, and  chewing tobacco. If you need help quitting, ask your health care provider. Do not use drugs. If you are sexually active, practice safe sex. Use a condom or other form of protection to prevent STIs (sexually transmitted infections). If told by your health care provider, take low-dose aspirin daily starting at age 56. Find healthy ways to cope with stress, such as: Meditation, yoga, or listening to music. Journaling. Talking to a trusted person. Spending time with friends and family. Safety Always wear your seat belt while driving or riding in a vehicle. Do not drive: If you have been drinking alcohol. Do not ride with someone who has been drinking. When you are tired or distracted. While texting. Wear a helmet and other protective equipment during sports activities. If you have firearms in your house, make sure you follow all gun safety procedures. What's next? Go to your health care provider once a year for an annual wellness visit. Ask your health care provider how often you should have your eyes and teeth checked. Stay up to date on all vaccines. This information is not intended to replace advice given to you by your health care provider. Make sure you discuss any questions you have with your healthcare provider. Document Revised: 08/03/2019 Document Reviewed: 10/29/2018 Elsevier Patient Education  2022 [employer].

## 2021-06-27 NOTE — Addendum Note (Signed)
Addended by: Jeronimo Greaves on: 06/27/2021 11:21 AM   Modules accepted: Orders
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
## 2021-06-29 LAB — DRUG MONITORING, PANEL 8 WITH CONFIRMATION, URINE
6 Acetylmorphine: NEGATIVE ng/mL (ref <10)
Alcohol Metabolites: NEGATIVE ng/mL (ref <500)
Amphetamines: NEGATIVE ng/mL (ref <500)
Benzodiazepines: NEGATIVE ng/mL (ref <100)
Buprenorphine, Urine: NEGATIVE ng/mL (ref <5)
Cocaine Metabolite: NEGATIVE ng/mL (ref <150)
Creatinine: 148.9 mg/dL (ref >=20.0)
MDMA: NEGATIVE ng/mL (ref <500)
Marijuana Metabolite: 1197 ng/mL — ABNORMAL HIGH (ref <5)
Marijuana Metabolite: POSITIVE ng/mL — AB (ref <20)
Opiates: NEGATIVE ng/mL (ref <100)
Oxidant: NEGATIVE ug/mL (ref <200)
Oxycodone: NEGATIVE ng/mL (ref <100)
pH: 5.9 (ref 4.5–9.0)

## 2021-06-29 LAB — DM TEMPLATE

## 2021-07-04 ENCOUNTER — Telehealth: Payer: Self-pay | Admitting: Medical

## 2021-07-04 DIAGNOSIS — G8929 Other chronic pain: Secondary | ICD-10-CM

## 2021-07-04 NOTE — Telephone Encounter (Signed)
Referral placed.

## 2021-07-04 NOTE — Telephone Encounter (Signed)
Could you refer pt to pain management relatively fast such as 2-3 weeks. If not please let me know.

## 2021-07-04 NOTE — Addendum Note (Signed)
Addended by: Anabel Halon on: 07/04/2021 11:53 AM   Modules accepted: Orders

## 2021-07-10 ENCOUNTER — Other Ambulatory Visit: Payer: Self-pay | Admitting: Medical

## 2021-07-11 ENCOUNTER — Encounter: Payer: Self-pay | Admitting: Physical Medicine and Rehabilitation

## 2021-07-11 ENCOUNTER — Ambulatory Visit (INDEPENDENT_AMBULATORY_CARE_PROVIDER_SITE_OTHER): Payer: Medicare Other | Admitting: Pharmacist

## 2021-07-11 DIAGNOSIS — I10 Essential (primary) hypertension: Secondary | ICD-10-CM

## 2021-07-11 DIAGNOSIS — M5441 Lumbago with sciatica, right side: Secondary | ICD-10-CM

## 2021-07-11 DIAGNOSIS — G8929 Other chronic pain: Secondary | ICD-10-CM

## 2021-07-11 DIAGNOSIS — Z87891 Personal history of nicotine dependence: Secondary | ICD-10-CM

## 2021-07-14 ENCOUNTER — Telehealth: Payer: Self-pay | Admitting: Medical

## 2021-07-14 MED ORDER — HYDROCODONE-ACETAMINOPHEN 5-325 MG PO TABS: 1.0000 | ORAL_TABLET | Freq: Four times a day (QID) | ORAL | 0 refills | Status: DC | PRN

## 2021-07-14 NOTE — Telephone Encounter (Signed)
Rx norco sent to pt pharmacy.

## 2021-07-15 NOTE — Patient Instructions (Signed)
Visit Information  PATIENT GOALS:  Goals Addressed             This Visit's Progress    Chronic Care Mangement Pharmacy Care Plan       CARE PLAN ENTRY (see longitudinal plan of care for additional care plan information)  Current Barriers:  Chronic Disease Management support, education, and care coordination needs related to Tobacco Use Disorder, Pre-Diabetes, Hypertension, Pain, GERD    Hypertension BP Readings from Last 3 Encounters:  06/27/21 (!) 138/99  05/22/21 137/90  04/05/21 126/90  Pharmacist Clinical Goal(s): Over the next 90 days, patient will work with PharmD and providers to maintain BP goal <140/90 Current regimen:  Losartan '25mg'$  daily Interventions: Requested patient to purchase a blood pressure cuff (completed) Requested patient to check blood pressure 2-3 times per week  Patient self care activities - Over the next 90 days, patient will: Purchase blood pressure cuff Check blood presure 2 to 3 times per week, document, and provide at future appointments Ensure daily salt intake < 2300 mg/day  Pre-Diabetes Lab Results  Component Value Date/Time   HGBA1C 6.0 01/21/2020 11:25 AM  Pharmacist Clinical Goal(s): Over the next 90 days, patient will work with PharmD and providers to maintain A1c goal <6.5% Current regimen:  Diet and exercise management   Interventions: Discussed importance of limiting carbohydrates Patient self care activities - Over the next 180 days, patient will: Limit carbohydrates to 45-60 grams per meal  Tobacco Use Disorder Pharmacist Clinical Goal(s) Over the next 90 days, patient will work with PharmD and providers to stop smoking Current regimen:  Bupropion XL '150mg'$  daily Patient self care activities - Over the next 90 days, patient will: Continue to refrain from smoking  Adhere to smoking cessation regimen  Pain (Goal: Manage symptoms) Current treatment  Oxycodone IR '5mg'$  take 1 tablet as needed up to every 8 hours for severe  pain Flexeril '10mg'$  prn Gabapentin '800mg'$  bid prn Interventions:  Coordinated with primary care provider regarding next steps for pain management until patient sees specialist 9/1. Patient was prescribed hydrocodone APAP by PCP.   Reviewed recommendation to avoid recreational medications  Medication management Pharmacist Clinical Goal(s): Over the next 90 days, patient will work with PharmD and providers to maintain optimal medication adherence Current pharmacy: [facility]  Interventions Comprehensive medication review performed. Continue current medication management strategy Patient self care activities - Over the next 90 days, patient will: Focus on medication adherence by filling medications appropriately Take medications as prescribed Report any questions or concerns to PharmD and/or provider(s)  Please see past updates related to this goal by clicking on the "Past Updates" button in the selected goal          The patient verbalized understanding of instructions, educational materials, and care plan provided today and declined offer to receive copy of patient instructions, educational materials, and care plan.   Telephone follow up appointment with care management team member scheduled for: 90days  Cherre Robins, PharmD Clinical Pharmacist [facility]

## 2021-07-15 NOTE — Chronic Care Management (AMB) (Addendum)
Chronic Care Management Pharmacy Note  07/15/2021 Name:  Jowel Waltner MRN:  [id number] DOB:  1966-05-05  Subjective: Cory Benson is an 55 y.o. year old male who is a primary patient of [name], [name], [place].  The CCM team was consulted for assistance with disease management and care coordination needs.    Engaged with patient by telephone for follow up visit in response to provider referral for pharmacy case management and/or care coordination services.   Consent to Services:  The patient was given information about Chronic Care Management services, agreed to services, and gave verbal consent prior to initiation of services.  Please see initial visit note for detailed documentation.   Patient Care Team: [name], Iris Pert as PCP - General (Internal Medicine) Cherre Robins, PharmD (Pharmacist)  Recent office visits: 05/22/2021 - PCP ([name], [place]) chronic low back pain; Rx cyclobenzaprine 41m bid prn muscle spasms 03/30/2021 - PCP ([name], [place] spinal stenosis f/u; s/p surgery 1 week ago. No med changes  Recent consult visits: 06/18/2021 - [facility] referred to [facility]o; Rx oxycodone [id number] as needed (last refill) to speak to PCP about resuming hydrocodone.  04/30/2021 - [facility] [place] 6 weeks s/p L4L5 XLIP and removal of thoracic mass. OK'd to return to work with light duty; Start PT for low back and groin pain; No med changes.  04/05/2021 - [facility] Med (Dr ScRaeford RazorF/U low back pain. No med changes.  04[facility]02/2021 [facility]  L4-5 XLIF and removal of thoracic mass. No med changes.  Hospital visits: 03/23/2021 - 03/24/2021 [facility].  Meds stopped: hydrocodone/APAP 5/32559meds started: oxycodone 5mg64m - take 5mg 33mhours PRN pain up to 7 days  Objective:  Lab Results  Component Value Date    CREATININE 1.02 06/27/2021   CREATININE 1.02 03/03/2020   CREATININE 1.04 01/21/2020    Lab Results  Component Value Date   HGBA1C 6.0 01/21/2020   Last diabetic Eye exam: No results found for: HMDIABEYEEXA  Last diabetic Foot exam: No results found for: HMDIABFOOTEX      Component Value Date/Time   CHOL 163 06/27/2021 1114   TRIG 66.0 06/27/2021 1114   HDL 47.00 06/27/2021 1114   CHOLHDL 3 06/27/2021 1114   VLDL 13.2 06/27/2021 1114   LDLCALC 103 (H) 06/27/2021 1114   LDLCALC 84 10/01/2019 1423    Hepatic Function Latest Ref Rng & Units 06/27/2021 03/03/2020 01/21/2020  Total Protein 6.0 - 8.3 g/dL 7.3 6.9 7.8  Albumin 3.5 - 5.2 g/dL 4.1 4.0 4.2  AST 0 - 37 U/L _0 ALT 0 - 53 U/L _1 Alk Phosphatase 39 - 117 U/L 64 62 63  Total Bilirubin 0.2 - 1.2 mg/dL 0.4 0.5 0.6    No results found for: TSH, FREET4  CBC Latest Ref Rng & Units 06/27/2021 01/21/2020 10/01/2019  WBC 4.0 - 10.5 K/uL 4.8 7.9 5.7  Hemoglobin 13.0 - 17.0 g/dL 13.4 14.5 13.9  Hematocrit 39.0 - 52.0 % 40.3 42.7 40.6  Platelets 150.0 - 400.0 K/uL 196.0 242.0 206    No results found for: VD25OH  Clinical ASCVD: No  The 10-year ASCVD risk score (GoffMikey Bussingr., et al., 2013) is: 12.1%   Values used to calculate the score:     Age: 29 ye41s     Sex:  Male     Is Non-Hispanic African American: Yes     Diabetic: No     Tobacco smoker: No     Systolic Blood Pressure: 527 mmHg     Is BP treated: Yes     HDL Cholesterol: 47 mg/dL     Total Cholesterol: 163 mg/dL    Other: (CHADS2VASc if Afib, PHQ9 if depression, MMRC or CAT for COPD, ACT, DEXA)  Social History   Tobacco Use  Smoking Status Former   Packs/day: 0.25   Years: 15.00   Pack years: 3.75   Types: Cigarettes   Quit date: 06/10/2020   Years since quitting: 1.0  Smokeless Tobacco Never   BP Readings from Last 3 Encounters:  06/27/21 (!) 138/99  05/22/21 137/90  04/05/21 126/90   Pulse Readings from Last 3 Encounters:  06/27/21 67   05/22/21 92  03/29/21 100   Wt Readings from Last 3 Encounters:  06/27/21 199 lb 12.8 oz (90.6 kg)  05/22/21 204 lb (92.5 kg)  04/05/21 198 lb (89.8 kg)    Assessment: Review of patient past medical history, allergies, medications, health status, including review of consultants reports, laboratory and other test data, was performed as part of comprehensive evaluation and provision of chronic care management services.   SDOH:  (Social Determinants of Health) assessments and interventions performed:  SDOH Interventions    Flowsheet Row Most Recent Value  SDOH Interventions   Financial Strain Interventions Intervention Not Indicated  Physical Activity Interventions Intervention Not Indicated, Other (Comments)  [completing PT. Had back surgery 03/2021. Unable to participat in exerise except PT at this time]       CCM Care Plan  No Known Allergies  Medications Reviewed Today     Reviewed by Cherre Robins, PharmD (Pharmacist) on 07/11/21 at 90  Med List Status: <None>   Medication Order Taking? Sig Documenting Provider Last Dose Status Informant  buPROPion (WELLBUTRIN XL) 150 MG 24 hr tablet [id number] Yes Take 1 tablet by mouth once daily [name], [name], [name] Taking Active   cyclobenzaprine (FLEXERIL) 10 MG tablet [id number] Yes TAKE 1 TABLET BY MOUTH TWICE DAILY FOR MUSCLE SPASM [name], [name], [name] Taking Active   famotidine (PEPCID) 20 MG tablet [id number] Yes Take 1 tablet by mouth once daily [name], Edward, [name] Taking Active   gabapentin (NEURONTIN) 800 MG tablet [id number] Yes Take 1 tablet by mouth twice daily [name], Edward, [name] Taking Active   losartan (COZAAR) 25 MG tablet [id number] Yes Take 1 tablet by mouth once daily [name], Edward, [name] Taking Active   oxyCODONE (OXY IR/ROXICODONE) 5 MG immediate release tablet [id number] Yes 1 tab po every 8 hours prn severe pain. [name], [name], [name] Taking Active   tadalafil (CIALIS) 20 MG tablet [id number] Yes 1/2-1 tab prior  to sex Elise Benne Taking Active             Patient Active Problem List   Diagnosis Date Noted   Lipoma of torso 12/19/2020   Carpal tunnel syndrome on right 11/21/2020   Facet hypertrophy of cervical region 05/30/2020   Degenerative tear of medial meniscus of left knee 02/10/2020   Facet arthritis of lumbar region 12/31/2019   Spinal stenosis of lumbar region without neurogenic claudication 12/31/2019   Chronic bilateral low back pain with right-sided sciatica 11/22/2019    Immunization History  Administered Date(s) Administered   Tdap 06/27/2021    Conditions to be addressed/monitored: HTN and pre DM; pain; smoking cessation  Care Plan : General Pharmacy (Adult)  Updates made by Cherre Robins, PHARMD since 07/15/2021 12:00 AM     Problem: HTN, PreDM: GERD; pain   Priority: High  Onset Date: 01/05/2021     Long-Range Goal: Patient-Specific Goal   Start Date: 01/05/2021  Expected End Date: 07/05/2021  Recent Progress: On track  Priority: High  Note:   Current Barriers:  Unable to achieve control of back pain  Recently stopped smoking prior to surgery in May 2022.  Providing ongoing support and encouragement  Pharmacist Clinical Goal(s):  Over the next 120 days, patient will achieve adherence to monitoring guidelines and medication adherence to achieve therapeutic efficacy achieve control of lower nicotine levels to the point he is able to have surgery as evidenced by labwork Work to control pain through collaboration with PharmD and provider.    Interventions: 1:1 collaboration with [name], [name], [name] regarding development and update of comprehensive plan of care as evidenced by provider attestation and co-signature Inter-disciplinary care team collaboration (see longitudinal plan of care) Comprehensive medication review performed; medication list updated in electronic medical record  Hypertension (BP goal <140/90) Controlled Current  treatment: Losartan 33m daily Medications previously tried: none noted  Current home readings: has not checked in a few weeks because BP cuff was packed away for recent move. Denies hypotensive/hypertensive symptoms Reviwed refill history for losartan - has filled for 90 DS 2/28 and 5/26 Interventions:  Reviewed BP goals and benefits of medications for prevention of heart attack, stroke and kidney damage; Continue to monitor BP at home a few times per week, document, and provide log at future appointments Reviewed medication adherence - reminded refill for losartan due soon Recommended continue current medication  Pre Diabetes (A1c goal <6.5%)  Lab Results  Component Value Date   HGBA1C 6.0 01/21/2020  Controlled Current medications: None at this time Medications previously tried: none noted  Current home glucose readings - not checking Denies hypoglycemic/hyperglycemic symptoms Interventions (addressed at previous visits)  Educated onA1c and blood sugar goals; Counseled to check feet daily and get yearly eye exams Counseled on diet and exercise extensively Recommended to continue current medication  Tobacco use (Goal continue smoking cessation) Controlled Current treatment  Bupropion XL 1512mdaily He has not smoked since before back surgery 03/2021 Interventions:  Recommended to continue current medication   Pain (Goal: Manage symptoms) -Uncontrolled Current treatment  Oxycodone IR 72m48make 1 tablet as needed up to every 8 hours for severe pain Flexeril 17m48mn Gabapentin 800mg30m prn Medications previously tried: hydrocodone/ APAP Patient has been referred to pain management - will see them 07/19/2021 Patient reports that at last visit PCP has discussed tapering oxycodone IR over the next few weeks. Patient last filled #21 oxycodone 1 week ago. Will take last oxycodone later today.  Interventions:  Coordinated with primary care provider regarding next steps for pain  management until patient sees specialist 9/1. Patient was prescribed hydrocodone APAP by PCP.   Reviewed recommendation to avoid recreational medications   Patient Goals/Self-Care Activities Over the next 90 days, patient will:  Take medications as prescribed check blood pressure 2 to 3 times per week, document, and provide at future appointments Consider starting exercise program if approved by pain management / physical therapist.  Follow Up Plan: The care management team will reach out to the patient again over the next 90 days.       Medication Assistance: None required.  Patient affirms current coverage meets needs.  [facility]  [phone]  [place], [place] [address] Phone: [phone] Fax: [phone]  Uses pill box? No -   Pt endorses 95% compliance  Follow Up:  Patient agrees to Care Plan and Follow-up.  Plan: Telephone follow up appointment with care management team member scheduled for:  74 day  Cherre Robins, PharmD Clinical Pharmacist [facility]    I have personally reviewed this encounter including the documentation in this note and have collaborated with the care management provider regarding care management and care coordination activities to include development and update of the comprehensive care plan. I am certifying that I agree with the content of this note and encounter as supervising provider.    Mackie Pai, [name]

## 2021-07-19 ENCOUNTER — Telehealth: Payer: Self-pay | Admitting: Medical

## 2021-07-20 NOTE — Telephone Encounter (Signed)
Pt now seeing pain management. I refilled his gabapentin and flexeril.Is pain management prescribing. Asking as sometimes we get automatic refill request. Want make sure not double prescribing. Some states treat gabapentin like controlled med.

## 2021-07-25 ENCOUNTER — Encounter: Payer: Medicare Other | Admitting: Physical Medicine and Rehabilitation

## 2021-07-30 ENCOUNTER — Ambulatory Visit: Payer: Medicare Other | Admitting: Medical

## 2021-08-01 ENCOUNTER — Ambulatory Visit: Payer: Medicare Other | Admitting: Medical

## 2021-08-01 MED ORDER — LOSARTAN POTASSIUM 25 MG PO TABS: 25.0000 mg | ORAL_TABLET | Freq: Every day | ORAL | 0 refills | Status: DC

## 2021-08-01 NOTE — Telephone Encounter (Signed)
Medication: losartan (COZAAR) 25 MG tablet   Has the patient contacted their pharmacy? Yes.   (If no, request that the patient contact the pharmacy for the refill.) (If yes, when and what did the pharmacy advise?)  Preferred Pharmacy (with phone number or street name): [facility], [place], [place] [address]  Phone:  [phone]    Agent: Please be advised that RX refills may take up to 3 business days. We ask that you follow-up with your pharmacy.

## 2021-08-01 NOTE — Telephone Encounter (Signed)
Rx sent 

## 2021-08-06 ENCOUNTER — Other Ambulatory Visit: Payer: Self-pay

## 2021-08-06 MED ORDER — LOSARTAN POTASSIUM 25 MG PO TABS: 25.0000 mg | ORAL_TABLET | Freq: Every day | ORAL | 0 refills | Status: DC

## 2021-08-08 ENCOUNTER — Ambulatory Visit (HOSPITAL_BASED_OUTPATIENT_CLINIC_OR_DEPARTMENT_OTHER)
Admission: RE | Admit: 2021-08-08 | Discharge: 2021-08-08 | Disposition: A | Discharge status: Home / Self Care | Payer: Medicare Other | Source: Ambulatory Visit | Attending: Medical | Admitting: Medical

## 2021-08-08 ENCOUNTER — Ambulatory Visit (INDEPENDENT_AMBULATORY_CARE_PROVIDER_SITE_OTHER): Payer: Medicare Other | Admitting: Medical

## 2021-08-08 ENCOUNTER — Other Ambulatory Visit: Payer: Self-pay

## 2021-08-08 ENCOUNTER — Telehealth: Payer: Self-pay | Admitting: *Deleted

## 2021-08-08 VITALS — BP 126/75 | HR 77 | Temp 98.6°F | Resp 18 | Ht 73.0 in | Wt 196.6 lb

## 2021-08-08 DIAGNOSIS — R059 Cough, unspecified: Secondary | ICD-10-CM | POA: Diagnosis present

## 2021-08-08 DIAGNOSIS — R252 Cramp and spasm: Secondary | ICD-10-CM | POA: Diagnosis not present

## 2021-08-08 DIAGNOSIS — M545 Low back pain, unspecified: Secondary | ICD-10-CM | POA: Diagnosis not present

## 2021-08-08 DIAGNOSIS — F17200 Nicotine dependence, unspecified, uncomplicated: Secondary | ICD-10-CM

## 2021-08-08 DIAGNOSIS — L739 Follicular disorder, unspecified: Secondary | ICD-10-CM | POA: Diagnosis not present

## 2021-08-08 DIAGNOSIS — Z23 Encounter for immunization: Secondary | ICD-10-CM | POA: Diagnosis not present

## 2021-08-08 DIAGNOSIS — G8929 Other chronic pain: Secondary | ICD-10-CM

## 2021-08-08 DIAGNOSIS — Z7185 Encounter for immunization safety counseling: Secondary | ICD-10-CM

## 2021-08-08 MED ORDER — CEPHALEXIN 500 MG PO CAPS: 500.0000 mg | ORAL_CAPSULE | Freq: Two times a day (BID) | ORAL | 0 refills | Status: DC
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
## 2021-08-08 NOTE — Telephone Encounter (Signed)
Pt came to the lab from the clinic. Pt was on phone with insurance during our lab encounter trying to verify if Gardasil is covered by his insurance. At end of call, pt placed call on speaker and asked CSR to repeat verification to me.  She stated Gardasil was covered and provided Reference # of [id number].

## 2021-08-08 NOTE — Addendum Note (Signed)
Addended by: Jeronimo Greaves on: 08/08/2021 02:04 PM   Modules accepted: Orders

## 2021-08-08 NOTE — Patient Instructions (Addendum)
For chronic back pain post surgery will refer you to PT at [facility].  For follicultis vs keloid prescribe keflex antibiotic. Let me know if area does not normalize. If not then refer to dermatologist.  For left calf and thigh cramp get cmp with mg.  For hx of smoking do recommend that you stop. Occasional cough daily. Will get cxr. With your 1/4 pack smoker per day for 33 years likely not qualify for ct chest.  Bright red blood wiping I think is from constipation. Small mucosal tear secondary or small hemorrhoid. Avoid constipation by hydration, high fiber foods and exrercise. If you have recurrent bright red blood let us know. Up to date on colonoscopy.  Follow up in 1 month or sooner if needed  Would ask you call your insurance directly to ask if they cover hpv above 50

## 2021-08-09 LAB — COMPREHENSIVE METABOLIC PANEL
ALT: 19 U/L (ref 0–53)
AST: 19 U/L (ref 0–37)
Albumin: 3.8 g/dL (ref 3.5–5.2)
Alkaline Phosphatase: 59 U/L (ref 39–117)
BUN: 14 mg/dL (ref 6–23)
CO2: 27 mEq/L (ref 19–32)
Calcium: 8.8 mg/dL (ref 8.4–10.5)
Chloride: 104 mEq/L (ref 96–112)
Creatinine, Ser: 1.08 mg/dL (ref 0.40–1.50)
GFR: 77 mL/min (ref >60.00)
Glucose, Bld: 78 mg/dL (ref 70–99)
Potassium: 4.1 mEq/L (ref 3.5–5.1)
Sodium: 137 mEq/L (ref 135–145)
Total Bilirubin: 0.4 mg/dL (ref 0.2–1.2)
Total Protein: 6.8 g/dL (ref 6.0–8.3)

## 2021-08-09 LAB — MAGNESIUM: Magnesium: 2 mg/dL (ref 1.5–2.5)

## 2021-08-19 ENCOUNTER — Other Ambulatory Visit: Payer: Self-pay | Admitting: Medical

## 2021-08-31 ENCOUNTER — Telehealth: Payer: Self-pay | Admitting: Pharmacist

## 2021-08-31 NOTE — Chronic Care Management (AMB) (Signed)
    Chronic Care Management Pharmacy Assistant   Name: [name]  MRN: [id number] DOB: July 25, 1966  Cory Benson is an 55 y.o. year old male who presents for her follow-up CCM visit with the clinical pharmacist.  Reason for Encounter: Disease State General    Recent office visits:  08/08/21 PCP Mackie Pai [name]- pt was seen for Chronic low back pain. For chronic back pain post surgery will refer you to PT at [facility]. Pt started For follicultis vs keloid prescribe keflex antibiotic and advised to f/u in 1 month.  Recent consult visits:  07/20/21 Ortho Lovett Calender MD- pt was seen for Primary osteoarthritis of left hip  . Pt received injection block. No f/u noted.  Hospital visits:  None in previous 6 months  Medications: Outpatient Encounter Medications as of 08/31/2021  Medication Sig   buPROPion (WELLBUTRIN XL) 150 MG 24 hr tablet Take 1 tablet by mouth once daily   cephALEXin (KEFLEX) 500 MG capsule Take 1 capsule (500 mg total) by mouth 2 (two) times daily.   cyclobenzaprine (FLEXERIL) 10 MG tablet TAKE 1 TABLET BY MOUTH TWICE DAILY FOR MUSCLE SPASM   famotidine (PEPCID) 20 MG tablet Take 1 tablet by mouth once daily   gabapentin (NEURONTIN) 800 MG tablet Take 1 tablet by mouth twice daily   HYDROcodone-acetaminophen (NORCO) 5-325 MG tablet Take 1 tablet by mouth every 6 (six) hours as needed for moderate pain.   losartan (COZAAR) 25 MG tablet Take 1 tablet (25 mg total) by mouth daily.   tadalafil (CIALIS) 20 MG tablet 1/2-1 tab prior to sex   No facility-administered encounter medications on file as of 08/31/2021.    Have you had any problems recently with your health? Pt stated he received injection and is still feeling pain, he can't sleep and is very miserable.  Have you had any problems with your pharmacy? Pt stated he is not having any problems with his pharmacy.  What issues or side effects are you having with your medications? Pt stated he is not having any side  effects that he is aware of.  What would you like me to pass along to [place] for them to help you with?  Pt is requesting a sooner appt with PCP because his hip/groin pain is excruciating and he is seeking relief  What can we do to take care of you better? Pt is requesting a sooner appt w/ PCP for steroid injection. His pain levels are always at a 10 and he is miserable. Pt stated to me that he has not been keeping a log of his BP because he lost his BP cuff. Last his BP was checked was two weeks ago.  Ask pt about low back pain and groin pain, also BP readings.   Star Rating Drugs:  losartan (COZAAR) 25 MG tablet last fill 08/06/21 90 DS  [facility] Clinical Pharmacist Assistant [phone]

## 2021-09-05 NOTE — Telephone Encounter (Signed)
Reviewed notes today - noted that patient wanted to see provider earlier. He has appt for 09/07/2021 - Outreached patient to see if he would want to come in tomorrow. No answer - LM on VM with office number to call if he would like to change appointment

## 2021-09-07 ENCOUNTER — Ambulatory Visit (HOSPITAL_BASED_OUTPATIENT_CLINIC_OR_DEPARTMENT_OTHER)
Admission: RE | Admit: 2021-09-07 | Discharge: 2021-09-07 | Disposition: A | Discharge status: Home / Self Care | Payer: Medicare Other | Source: Ambulatory Visit | Attending: Medical | Admitting: Medical

## 2021-09-07 ENCOUNTER — Ambulatory Visit (INDEPENDENT_AMBULATORY_CARE_PROVIDER_SITE_OTHER): Payer: Medicare Other | Admitting: Medical

## 2021-09-07 ENCOUNTER — Other Ambulatory Visit: Payer: Self-pay

## 2021-09-07 VITALS — BP 126/75 | HR 98 | Temp 98.2°F | Resp 18 | Ht 73.0 in | Wt 197.4 lb

## 2021-09-07 DIAGNOSIS — M79644 Pain in right finger(s): Secondary | ICD-10-CM | POA: Diagnosis not present

## 2021-09-07 DIAGNOSIS — K5900 Constipation, unspecified: Secondary | ICD-10-CM | POA: Diagnosis not present

## 2021-09-07 DIAGNOSIS — M25559 Pain in unspecified hip: Secondary | ICD-10-CM

## 2021-09-07 DIAGNOSIS — M79641 Pain in right hand: Secondary | ICD-10-CM
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
## 2021-09-07 NOTE — Patient Instructions (Signed)
History of left hip pain with improvement after getting injection by orthopedist.  Request referral back to former orthopedist so I did place that referral.  Right hand and second digit MCP joint region pain.  I will get x-ray of right hand to see if you have osteoarthritis type changes.  Back pain with history of a surgery.  Continue with physical therapy.  For back pain in the above areas of pain can use Tylenol and ibuprofen.Jim Desanctis to hear that you no longer require narcotic medication.  History of recent constipation of the last 52months.  History of remote complicated abdomen surgery.  Recommend hydrate well daily and eat fiber rich diet.  Avoid all processed foods.  Try to get some daily exercise.  If 2 days passed with no bowel movement can use MiraLAX or Dulcolax.  If having feeling of abdomen distention with increasing pain let us know.  In that event can get abdomen x-ray.  If abnormal x-ray then would get CT abdomen.  Presently no imaging studies needed today.  Follow-up in 1 month or sooner if needed.

## 2021-09-20 ENCOUNTER — Other Ambulatory Visit: Payer: Self-pay | Admitting: Medical

## 2021-09-26 ENCOUNTER — Ambulatory Visit (INDEPENDENT_AMBULATORY_CARE_PROVIDER_SITE_OTHER): Payer: Medicare Other

## 2021-09-26 VITALS — Ht 73.0 in | Wt 197.0 lb

## 2021-09-26 DIAGNOSIS — Z0000 Encounter for general adult medical examination without abnormal findings: Secondary | ICD-10-CM | POA: Diagnosis not present
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
## 2021-09-26 NOTE — Patient Instructions (Signed)
Cory Benson , Thank you for taking time to complete your Medicare Wellness Visit. I appreciate your ongoing commitment to your health goals. Please review the following plan we discussed and let me know if I can assist you in the future.   Screening recommendations/referrals: Colonoscopy: Completed 10/29/2019-Due 10/28/2024 Recommended yearly ophthalmology/optometry visit for glaucoma screening and checkup Recommended yearly dental visit for hygiene and checkup  Vaccinations: Influenza vaccine: Declined Pneumococcal vaccine: Due at age 43 Tdap vaccine: Up to date Shingles vaccine: Discuss with pharmacy   Covid-19: Declined  Advanced directives: Information mailed today  Conditions/risks identified: See problem list  Next appointment: Follow up in one year for your annual wellness visit 09/27/2022 @ 11:00  Preventive Care 40-64 Years, Male Preventive care refers to lifestyle choices and visits with your health care provider that can promote health and wellness. What does preventive care include? A yearly physical exam. This is also called an annual well check. Dental exams once or twice a year. Routine eye exams. Ask your health care provider how often you should have your eyes checked. Personal lifestyle choices, including: Daily care of your teeth and gums. Regular physical activity. Eating a healthy diet. Avoiding tobacco and drug use. Limiting alcohol use. Practicing safe sex. Taking low-dose aspirin every day starting at age 13. What happens during an annual well check? The services and screenings done by your health care provider during your annual well check will depend on your age, overall health, lifestyle risk factors, and family history of disease. Counseling  Your health care provider may ask you questions about your: Alcohol use. Tobacco use. Drug use. Emotional well-being. Home and relationship well-being. Sexual activity. Eating habits. Work and work  Statistician. Screening  You may have the following tests or measurements: Height, weight, and BMI. Blood pressure. Lipid and cholesterol levels. These may be checked every 5 years, or more frequently if you are over 7 years old. Skin check. Lung cancer screening. You may have this screening every year starting at age 7 if you have a 30-pack-year history of smoking and currently smoke or have quit within the past 15 years. Fecal occult blood test (FOBT) of the stool. You may have this test every year starting at age 89. Flexible sigmoidoscopy or colonoscopy. You may have a sigmoidoscopy every 5 years or a colonoscopy every 10 years starting at age 74. Prostate cancer screening. Recommendations will vary depending on your family history and other risks. Hepatitis C blood test. Hepatitis B blood test. Sexually transmitted disease (STD) testing. Diabetes screening. This is done by checking your blood sugar (glucose) after you have not eaten for a while (fasting). You may have this done every 1-3 years. Discuss your test results, treatment options, and if necessary, the need for more tests with your health care provider. Vaccines  Your health care provider may recommend certain vaccines, such as: Influenza vaccine. This is recommended every year. Tetanus, diphtheria, and acellular pertussis (Tdap, Td) vaccine. You may need a Td booster every 10 years. Zoster vaccine. You may need this after age 34. Pneumococcal 13-valent conjugate (PCV13) vaccine. You may need this if you have certain conditions and have not been vaccinated. Pneumococcal polysaccharide (PPSV23) vaccine. You may need one or two doses if you smoke cigarettes or if you have certain conditions. Talk to your health care provider about which screenings and vaccines you need and how often you need them. This information is not intended to replace advice given to you by your health care provider.  Make sure you discuss any questions you  have with your health care provider. Document Released: 12/01/2015 Document Revised: 07/24/2016 Document Reviewed: 09/05/2015 Elsevier Interactive Patient Education  2017 [employer] Prevention in the Home Falls can cause injuries. They can happen to people of all ages. There are many things you can do to make your home safe and to help prevent falls. What can I do on the outside of my home? Regularly fix the edges of walkways and driveways and fix any cracks. Remove anything that might make you trip as you walk through a door, such as a raised step or threshold. Trim any bushes or trees on the path to your home. Use bright outdoor lighting. Clear any walking paths of anything that might make someone trip, such as rocks or tools. Regularly check to see if handrails are loose or broken. Make sure that both sides of any steps have handrails. Any raised decks and porches should have guardrails on the edges. Have any leaves, snow, or ice cleared regularly. Use sand or salt on walking paths during winter. Clean up any spills in your garage right away. This includes oil or grease spills. What can I do in the bathroom? Use night lights. Install grab bars by the toilet and in the tub and shower. Do not use towel bars as grab bars. Use non-skid mats or decals in the tub or shower. If you need to sit down in the shower, use a plastic, non-slip stool. Keep the floor dry. Clean up any water that spills on the floor as soon as it happens. Remove soap buildup in the tub or shower regularly. Attach bath mats securely with double-sided non-slip rug tape. Do not have throw rugs and other things on the floor that can make you trip. What can I do in the bedroom? Use night lights. Make sure that you have a light by your bed that is easy to reach. Do not use any sheets or blankets that are too big for your bed. They should not hang down onto the floor. Have a firm chair that has side arms. You can  use this for support while you get dressed. Do not have throw rugs and other things on the floor that can make you trip. What can I do in the kitchen? Clean up any spills right away. Avoid walking on wet floors. Keep items that you use a lot in easy-to-reach places. If you need to reach something above you, use a strong step stool that has a grab bar. Keep electrical cords out of the way. Do not use floor polish or wax that makes floors slippery. If you must use wax, use non-skid floor wax. Do not have throw rugs and other things on the floor that can make you trip. What can I do with my stairs? Do not leave any items on the stairs. Make sure that there are handrails on both sides of the stairs and use them. Fix handrails that are broken or loose. Make sure that handrails are as long as the stairways. Check any carpeting to make sure that it is firmly attached to the stairs. Fix any carpet that is loose or worn. Avoid having throw rugs at the top or bottom of the stairs. If you do have throw rugs, attach them to the floor with carpet tape. Make sure that you have a light switch at the top of the stairs and the bottom of the stairs. If you do not have them,  ask someone to add them for you. What else can I do to help prevent falls? Wear shoes that: Do not have high heels. Have rubber bottoms. Are comfortable and fit you well. Are closed at the toe. Do not wear sandals. If you use a stepladder: Make sure that it is fully opened. Do not climb a closed stepladder. Make sure that both sides of the stepladder are locked into place. Ask someone to hold it for you, if possible. Clearly mark and make sure that you can see: Any grab bars or handrails. First and last steps. Where the edge of each step is. Use tools that help you move around (mobility aids) if they are needed. These include: Canes. Walkers. Scooters. Crutches. Turn on the lights when you go into a dark area. Replace any light  bulbs as soon as they burn out. Set up your furniture so you have a clear path. Avoid moving your furniture around. If any of your floors are uneven, fix them. If there are any pets around you, be aware of where they are. Review your medicines with your doctor. Some medicines can make you feel dizzy. This can increase your chance of falling. Ask your doctor what other things that you can do to help prevent falls. This information is not intended to replace advice given to you by your health care provider. Make sure you discuss any questions you have with your health care provider. Document Released: 08/31/2009 Document Revised: 04/11/2016 Document Reviewed: 12/09/2014 Elsevier Interactive Patient Education  2017 [employer].

## 2021-10-01 ENCOUNTER — Telehealth: Payer: Self-pay | Admitting: Medical

## 2021-10-01 NOTE — Telephone Encounter (Signed)
Medication:  HYDROcodone-acetaminophen (NORCO) 5-325 MG tablet [[id number]]      Has the patient contacted their pharmacy? No. (If no, request that the patient contact the pharmacy for the refill.) (If yes, when and what did the pharmacy advise?)     Preferred Pharmacy (with phone number or street name):  [facility], [address], [place] [address]  Phone:  [phone]  Fax:  [phone]    Agent: Please be advised that RX refills may take up to 3 business days. We ask that you follow-up with your pharmacy.

## 2021-10-02 NOTE — Telephone Encounter (Signed)
Medication: cyclobenzaprine (FLEXERIL) 10 MG tablet [[id number]]     Has the patient contacted their pharmacy? Yes.   (If no, request that the patient contact the pharmacy for the refill.) (If yes, when and what did the pharmacy advise?) Pharmacy advised pt to contact pcp    Preferred Pharmacy (with phone number or street name):  [facility], [address], [place] [address]  Phone:  [phone]  Fax:  [phone]     Agent: Please be advised that RX refills may take up to 3 business days. We ask that you follow-up with your pharmacy.

## 2021-10-02 NOTE — Telephone Encounter (Signed)
Please advise   Unaware if he is able to get refills due to past drug screen

## 2021-10-02 NOTE — Telephone Encounter (Signed)
Patient called back to get an update on his med refills, he was advised that the provider was still working on it.

## 2021-10-03 ENCOUNTER — Other Ambulatory Visit: Payer: Self-pay

## 2021-10-03 MED ORDER — CYCLOBENZAPRINE HCL 10 MG PO TABS: ORAL_TABLET | ORAL | 0 refills | Status: DC

## 2021-10-03 NOTE — Telephone Encounter (Signed)
Pt called again and state he was very aggravated and does not understand why his refills have taken 72 hours, he was advised refills can take a couple days to process. He stated that if he doesn't get his refills by Friday, he is going to be very upset at his appt Friday.

## 2021-10-03 NOTE — Telephone Encounter (Signed)
Pt called back and I spoke with Cory Benson verbally he stated only refill flexeril due to past UDS no longer filling NORCO, told patient he wasn't refilling NORCO due to weed in his system , and he stated his back is killing him but he will talk to [place] on Friday at his appointment but will pick up muscle relaxer

## 2021-10-03 NOTE — Telephone Encounter (Signed)
Pt called back and I spoke with [name] verbally he stated only refill flexeril due to past UDS no longer filling NORCO, told patient he wasn't refilling NORCO due to weed in his system , and he stated his back is killing him but he will talk to [place] on Friday at his appointment but will pick up muscle relaxer.Marland KitchenMarland Kitchen

## 2021-10-05 ENCOUNTER — Other Ambulatory Visit: Payer: Self-pay

## 2021-10-05 ENCOUNTER — Ambulatory Visit (INDEPENDENT_AMBULATORY_CARE_PROVIDER_SITE_OTHER): Payer: Medicare Other | Admitting: Medical

## 2021-10-05 VITALS — BP 132/81 | HR 79 | Temp 98.2°F | Resp 18 | Ht 73.0 in | Wt 195.0 lb

## 2021-10-05 DIAGNOSIS — L603 Nail dystrophy: Secondary | ICD-10-CM

## 2021-10-05 DIAGNOSIS — M79641 Pain in right hand: Secondary | ICD-10-CM

## 2021-10-05 DIAGNOSIS — I10 Essential (primary) hypertension: Secondary | ICD-10-CM

## 2021-10-05 DIAGNOSIS — M545 Low back pain, unspecified: Secondary | ICD-10-CM | POA: Diagnosis not present

## 2021-10-05 DIAGNOSIS — G8929 Other chronic pain: Secondary | ICD-10-CM | POA: Diagnosis not present

## 2021-10-05 LAB — URIC ACID: Uric Acid, Serum: 4.3 mg/dL (ref 4.0–7.8)

## 2021-10-05 LAB — C-REACTIVE PROTEIN: CRP: <1 mg/dL (ref 0.5–20.0)

## 2021-10-05 LAB — SEDIMENTATION RATE: Sed Rate: 14 mm/hr (ref 0–20)

## 2021-10-05 MED ORDER — LOSARTAN POTASSIUM 25 MG PO TABS: 25.0000 mg | ORAL_TABLET | Freq: Every day | ORAL | 0 refills | Status: DC

## 2021-10-05 MED ORDER — CELECOXIB 100 MG PO CAPS: 100.0000 mg | ORAL_CAPSULE | Freq: Two times a day (BID) | ORAL | 0 refills | Status: DC

## 2021-10-05 MED ORDER — KETOROLAC TROMETHAMINE 60 MG/2ML IM SOLN
60.0000 mg | Freq: Once | INTRAMUSCULAR | Status: AC
  Administered 2021-10-05: 60 mg via INTRAMUSCULAR

## 2021-10-05 NOTE — Addendum Note (Signed)
Addended by: Jeronimo Greaves on: 10/05/2021 11:58 AM   Modules accepted: Orders
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
## 2021-10-05 NOTE — Patient Instructions (Addendum)
Chronic low back pain. Sorry you are having persisting pain. Pt is on gabapentin and flexeril. Offer toradol today.  Will rx celebrex and see if this is adequate for pain.   For back and hand pain rx celebrex. Can start tomorrow.(Rx advisement)   Erectile dysfunction. On cialis.   Htn controlled. Losartan 25 mg daily.   For rt hand/2nd digit base of digit pain will get inflammatory labs including uric acid. Referred to Dr Erlinda Hong hand specialist.  For dystrophic nails on feet referred to podiatrist.  Followed up 2-3 weeks or sooner if needed.

## 2021-10-08 LAB — RHEUMATOID FACTOR: Rheumatoid fact SerPl-aCnc: <14 IU/mL (ref <14)

## 2021-10-08 LAB — ANA: Anti Nuclear Antibody (ANA): NEGATIVE

## 2021-10-09 ENCOUNTER — Other Ambulatory Visit: Payer: Self-pay

## 2021-10-09 ENCOUNTER — Encounter: Payer: Self-pay | Admitting: Podiatry

## 2021-10-09 ENCOUNTER — Ambulatory Visit (INDEPENDENT_AMBULATORY_CARE_PROVIDER_SITE_OTHER): Payer: Medicare Other | Admitting: Podiatry

## 2021-10-09 DIAGNOSIS — L603 Nail dystrophy: Secondary | ICD-10-CM

## 2021-10-09 NOTE — Progress Notes (Signed)
  Subjective:  Patient ID: Roosvelt Churchwell, male    DOB: [birth year],  MRN: [id number]  Chief Complaint  Patient presents with   Nail Problem     NP L- Dystrophic nail     55 y.o. male presents with the above complaint. History confirmed with patient.  Nails thickened and elongated and curved in  Objective:  Physical Exam: warm, good capillary refill, no trophic changes or ulcerative lesions, normal DP and PT pulses, and normal sensory exam. Left Foot: Long second toe with hammertoe, dystrophic nail Assessment:   1. Dystrophic nail      Plan:  Patient was evaluated and treated and all questions answered.  Discussed etiology of the dystrophic nail what his second toe and hammertoe deformity contributes to this.  There is no clear evidence of onychomycosis.  I debrided the nail as a courtesy and discussed keeping nails short and filed smooth.  He will continue to have his pedicurist take care of this for him.  Return to see me as needed for this or other issues   Return if symptoms worsen or fail to improve.

## 2021-10-15 ENCOUNTER — Ambulatory Visit (INDEPENDENT_AMBULATORY_CARE_PROVIDER_SITE_OTHER): Payer: Medicare Other | Admitting: Pharmacist

## 2021-10-15 DIAGNOSIS — F17200 Nicotine dependence, unspecified, uncomplicated: Secondary | ICD-10-CM

## 2021-10-15 DIAGNOSIS — M25559 Pain in unspecified hip: Secondary | ICD-10-CM

## 2021-10-15 DIAGNOSIS — G8929 Other chronic pain: Secondary | ICD-10-CM

## 2021-10-15 DIAGNOSIS — I10 Essential (primary) hypertension: Secondary | ICD-10-CM

## 2021-10-15 NOTE — Chronic Care Management (AMB) (Addendum)
Chronic Care Management Pharmacy Note  10/15/2021 Name:  Cory Benson MRN:  [id number] DOB:  1966-07-05  Subjective: Cory Benson is an 55 y.o. year old male who is a primary patient of [name], Cory Benson, [place].  The CCM team was consulted for assistance with disease management and care coordination needs.    Engaged with patient by telephone for follow up visit in response to provider referral for pharmacy case management and/or care coordination services.   Consent to Services:  The patient was given information about Chronic Care Management services, agreed to services, and gave verbal consent prior to initiation of services.  Please see initial visit note for detailed documentation.   Patient Care Team: [name], Iris Pert as PCP - General (Internal Medicine) Cherre Robins, RPH-CPP (Pharmacist)  Recent office visits: 10/05/2021 - Fam Med ([name], Cory Benson) F/U pain - hip, back and hand. Prescribed celebrex 177m twice a day. Referred to Dr XErlinda Hongfor hand pain. Referred to Podiatry.  09/07/2021 - Fam Med ([place] Seen for hip and hand pain. Hip pain improved after injection. right hand pain - xray ordered. Constipation - recommedned OTC Miralax or Dulcolax if 2 days passes with no BM.  08/08/2021 - Fam Med ([name], PAC) F/U chronic conditions. Continue PT for back pain; Prescribed Keflex 5047mtiwce a day for 10 dyas  for folliculitis versus keloid on chin. Recommended high fiber foods and hydration for constipation / hemorrhoid.  05/22/2021 - PCP ([name], PAC) chronic low back pain; Rx cyclobenzaprine 1041mid prn muscle spasms   Recent consult visits: 10/09/2021 - Podiatry (Dr McDSherryle Liseen for dystrophic nails. Debrided nail. 09/20/2021 - Ortho (Dr SteRowe Robert/U left hip pain. Too early for next steroid injection in hip. Planned for December 2022.  07/20/2021 - Ortho (Dr HarLanae BoastNOsborne Omaneen for OA of left hip. Received injection of triamcinolone in left hip joint.  06/18/2021 -  [facility] ([place]ynetta [place] hip pain. referred to ortho; Rx oxycodone 5mg22mh as needed (last refill) to speak to PCP about resuming hydrocodone.  04/30/2021 - [facility] ([place]um[place] op f/u - 6 weeks s/p L4L5 XLIP and removal of thoracic mass. OK'd to return to work with light duty; Start PT for low back and groin pain; No med changes.    Benson visits: None in the previous 6 months  Objective:  Lab Results  Component Value Date   CREATININE 1.08 08/08/2021   CREATININE 1.02 06/27/2021   CREATININE 1.02 03/03/2020    Lab Results  Component Value Date   HGBA1C 6.0 01/21/2020   Last diabetic Eye exam: No results found for: HMDIABEYEEXA  Last diabetic Foot exam: No results found for: HMDIABFOOTEX      Component Value Date/Time   CHOL 163 06/27/2021 1114   TRIG 66.0 06/27/2021 1114   HDL 47.00 06/27/2021 1114   CHOLHDL 3 06/27/2021 1114   VLDL 13.2 06/27/2021 1114   LDLCALC 103 (H) 06/27/2021 1114   LDLCALC 84 10/01/2019 1423    Hepatic Function Latest Ref Rng & Units 08/08/2021 06/27/2021 03/03/2020  Total Protein 6.0 - 8.3 g/dL 6.8 7.3 6.9  Albumin 3.5 - 5.2 g/dL 3.8 4.1 4.0  AST 0 - 37 U/L 19 26 22   ALT 0 - 53 U/L 19 25 17   Alk Phosphatase 39 - 117 U/L 59 64 62  Total Bilirubin 0.2 - 1.2 mg/dL 0.4 0.4 0.5    No results found for: TSH, FREET4  CBC Latest Ref Rng & Units  06/27/2021 01/21/2020 10/01/2019  WBC 4.0 - 10.5 K/uL 4.8 7.9 5.7  Hemoglobin 13.0 - 17.0 g/dL 13.4 14.5 13.9  Hematocrit 39.0 - 52.0 % 40.3 42.7 40.6  Platelets 150.0 - 400.0 K/uL 196.0 242.0 206    No results found for: VD25OH  Clinical ASCVD: No  The 10-year ASCVD risk score (Arnett DK, et al., 2019) is: 19.2%   Values used to calculate the score:     Age: 25 years     Sex: Male     Is Non-Hispanic African American: Yes     Diabetic: No     Tobacco smoker: Yes     Systolic Blood Pressure: 341 mmHg     Is BP treated: Yes     HDL Cholesterol: 47 mg/dL      Total Cholesterol: 163 mg/dL      Social History   Tobacco Use  Smoking Status Every Day   Packs/day: 0.20   Years: 15.00   Pack years: 3.00   Types: Cigarettes   Start date: 11/19/1987  Smokeless Tobacco Never   BP Readings from Last 3 Encounters:  10/05/21 132/81  09/07/21 126/75  08/08/21 126/75   Pulse Readings from Last 3 Encounters:  10/05/21 79  09/07/21 98  08/08/21 77   Wt Readings from Last 3 Encounters:  10/05/21 195 lb (88.5 kg)  09/26/21 197 lb (89.4 kg)  09/07/21 197 lb 6.4 oz (89.5 kg)    Assessment: Review of patient past medical history, allergies, medications, health status, including review of consultants reports, laboratory and other test data, was performed as part of comprehensive evaluation and provision of chronic care management services.   SDOH:  (Social Determinants of Health) assessments and interventions performed:     CCM Care Plan  No Known Allergies  Medications Reviewed Today     Reviewed by Cherre Robins, RPH-CPP (Pharmacist) on 10/15/21 at 1451  Med List Status: <None>   Medication Order Taking? Sig Documenting Provider Last Dose Status Informant  buPROPion (WELLBUTRIN XL) 150 MG 24 hr tablet [id number] No Take 1 tablet by mouth once daily  Patient not taking: Reported on 10/15/2021   Mackie Pai, [name] Not Taking Active   celecoxib (CELEBREX) 100 MG capsule [id number] Yes Take 1 capsule (100 mg total) by mouth 2 (two) times daily. [name], Cory Miller, [name] Taking Active   cyclobenzaprine (FLEXERIL) 10 MG tablet [id number] Yes TAKE 1 TABLET BY MOUTH TWICE DAILY FOR MUSCLE SPASM [name], Cory Miller, [name] Taking Active   famotidine (PEPCID) 20 MG tablet [id number] Yes Take 1 tablet by mouth once daily [name], Cory Miller, [name] Taking Active   gabapentin (NEURONTIN) 800 MG tablet [id number] Yes Take 1 tablet by mouth twice daily [name], Cory Miller, [name] Taking Active   HYDROcodone-acetaminophen (NORCO) 5-325 MG tablet [id number] No Take 1 tablet by  mouth every 6 (six) hours as needed for moderate pain.  Patient not taking: Reported on 10/15/2021   Mackie Pai, [name] Not Taking Consider Medication Status and Discontinue   losartan (COZAAR) 25 MG tablet [id number] No Take 1 tablet (25 mg total) by mouth daily.  Patient not taking: Reported on 10/15/2021   Mackie Pai, [name] Not Taking Active   tadalafil (CIALIS) 20 MG tablet [id number] Yes 1/2-1 tab prior to sex Elise Benne Taking Active             Patient Active Problem List   Diagnosis Date Noted   Lipoma of torso 12/19/2020   Carpal tunnel syndrome on right 11/21/2020   Facet hypertrophy  of cervical region 05/30/2020   Degenerative tear of medial meniscus of left knee 02/10/2020   Facet arthritis of lumbar region 12/31/2019   Spinal stenosis of lumbar region without neurogenic claudication 12/31/2019   Chronic bilateral low back pain with right-sided sciatica 11/22/2019    Immunization History  Administered Date(s) Administered   HPV 9-valent 08/08/2021   Tdap 06/27/2021    Conditions to be addressed/monitored: HTN and pre DM; pain; smoking cessation  Care Plan : General Pharmacy (Adult)  Updates made by Cherre Robins, RPH-CPP since 10/15/2021 12:00 AM     Problem: HTN, PreDM: GERD; pain   Priority: High  Onset Date: 01/05/2021     Long-Range Goal: Provide education, support and care coordination for medication therapy and chronic conditions   Start Date: 01/05/2021  Expected End Date: 07/05/2021  Recent Progress: On track  Priority: High  Note:   Current Barriers:  Unable to achieve control of back or hip pain  Recently restarted smoking - has quit after surgery May 2022.  Providing ongoing support and encouragement  Pharmacist Clinical Goal(s):  Over the next 120 days, patient will achieve adherence to monitoring guidelines and medication adherence to achieve therapeutic efficacy Assist with smoking cessation efforts Work to control pain  through collaboration with PharmD and provider.   Interventions: 1:1 collaboration with [name], Cory Miller, [name] regarding development and update of comprehensive plan of care as evidenced by provider attestation and co-signature Inter-disciplinary care team collaboration (see longitudinal plan of care) Comprehensive medication review performed; medication list updated in electronic medical record  Hypertension (BP goal <140/90) Controlled Current treatment: Losartan 62m daily Medications previously tried: none noted  Current home readings: 131/75 Denies hypotensive/hypertensive symptoms Reviwed refill history for losartan - has filled for 90 DS 2/28, 5/26 and 08/06/2021. Patient reports since he has surgery and pain is improving his blood pressure has been at goal and is not taking losartan every day Interventions:  Reviewed BP goals and benefits of medications for prevention of heart attack, stroke and kidney damage; Continue to monitor BP at home a few times per week, document, and provide log at future appointments Recommended restart losartan. Will make PCP aware he has stopped losartan  Pre Diabetes (A1c goal <6.5%)  Lab Results  Component Value Date   HGBA1C 6.0 01/21/2020  Controlled Current medications: None at this time Medications previously tried: none noted  Current home glucose readings - not checking Denies hypoglycemic/hyperglycemic symptoms Interventions (addressed at previous visits)  Educated onA1c and blood sugar goals; Counseled to check feet daily and get yearly eye exams Counseled on diet and exercise extensively Recommended to continue current medication  Tobacco use (Goal continue smoking cessation) Controlled Current treatment  Bupropion XL 1587mdaily (not taking daily) Restarted smoking.  Interventions:  Recommended recommit to not smoking.  Reviewed smoking cessation benefits.  Recommended restart bupropion daily to help with cravings.   Pain  (Goal: Manage symptoms) Improving control.  Current treatment  Celecoxib 10074m take 1 capsules twice a day (patient is only taking once daily) Flexeril 32m74mn Gabapentin 800mg46m prn Medications previously tried: hydrocodone/ APAP Patient has been referred to pain management - but cancelled appt for 07/2021.  Patient to get steroid injection in hip 10/26/2021. Last injection helped pain in hip a lot.  Interventions:  Recommended use 200mg 57my of celecoxib when having more pain but Ok to use just 100mg d62m on days with less pain.  Keep follow up appointment for steroid injection.  Patient Goals/Self-Care  Activities Over the next 90 days, patient will:  Take medications as prescribed check blood pressure 2 to 3 times per week, document, and provide at future appointments Consider starting exercise program if approved by pain management / physical therapist.  Follow Up Plan: The care management team will reach out to the patient again over the next 90 days.       Medication Assistance: None required.  Patient affirms current coverage meets needs.  Patient's preferred pharmacy is:  [facility], [address] [place] [address] Phone: [phone] Fax: [phone]  Uses pill box? No -   Pt endorses 92% compliance  Follow Up:  Patient agrees to Care Plan and Follow-up.  Plan: Telephone follow up appointment with care management team member scheduled for:  68 day  Cherre Robins, PharmD Clinical Pharmacist [facility]   I have personally reviewed this encounter including the documentation in this note and have collaborated with the care management provider regarding care management and care coordination activities to include development and update of the comprehensive care plan. I am certifying that I agree with the content of this note and encounter as supervising  provider.  Mackie Pai, [name]

## 2021-10-15 NOTE — Patient Instructions (Signed)
Cory Benson,  It was a pleasure speaking with you today.  I have attached a summary of our visit today and information about your health goals.   Our next appointment is by telephone on February 27th, 2023 at 3:45pm  Please call the care guide team at [phone] if you need to cancel or reschedule your appointment.     If you have any questions or concerns, please feel free to contact me either at the phone number below or with a MyChart message.   Keep up the good work!  Cherre Robins, PharmD Clinical Pharmacist [facility] [phone] (direct line)  [phone] (main office number)   The patient verbalized understanding of instructions, educational materials, and care plan provided today and agreed to receive a mailed copy of patient instructions, educational materials, and care plan.    Hypertension BP Readings from Last 3 Encounters:  10/05/21 132/81  09/07/21 126/75  08/08/21 126/75   Pharmacist Clinical Goal(s): Over the next 90 days, patient will work with PharmD and providers to maintain BP goal <140/90 Current regimen:  Losartan 25mg  daily Interventions: Requested patient to purchase a blood pressure cuff (completed) Requested patient to check blood pressure 2-3 times per week  Patient self care activities - Over the next 90 days, patient will: Restart losartan 25mg  daily - has discussion with PCP regarding if you can stop losartan or not.  Check blood presure 2 to 3 times per week, document, and provide at future appointments Ensure daily salt intake < 2300 mg/day  Pre-Diabetes Lab Results  Component Value Date/Time   HGBA1C 6.0 01/21/2020 11:25 AM   Pharmacist Clinical Goal(s): Over the next 90 days, patient will work with PharmD and providers to maintain A1c goal <6.5% Current regimen:  Diet and exercise management   Interventions: Discussed importance of limiting carbohydrates Patient self care activities - Over the  next 180 days, patient will: Limit carbohydrates to 45-60 grams per meal Due to have A1c rechecked.   Tobacco Use Disorder Pharmacist Clinical Goal(s) Over the next 90 days, patient will work with PharmD and providers to stop smoking Current regimen:  Bupropion XL 150mg  daily Patient self care activities - Over the next 90 days, patient will: Restart bupropion to help with smoking cessation Plan to decrease number of cigarettes smoked to 1 or 2 per day for the first 3 to 7 days of restarting bupropion.  Pain (Goal: Manage symptoms) Current treatment  Celecoxib 100mg  twice a day Flexeril 10mg  prn Gabapentin 800mg  bid prn Interventions:  Continue current regimen for pain control  Medication management Pharmacist Clinical Goal(s): Over the next 90 days, patient will work with PharmD and providers to maintain optimal medication adherence Current pharmacy: [facility]  Interventions Comprehensive medication review performed. Continue current medication management strategy Patient self care activities - Over the next 90 days, patient will: Focus on medication adherence by filling medications appropriately Take medications as prescribed Report any questions or concerns to PharmD and/or provider(s)

## 2021-10-16 ENCOUNTER — Other Ambulatory Visit: Payer: Self-pay

## 2021-10-16 ENCOUNTER — Ambulatory Visit (INDEPENDENT_AMBULATORY_CARE_PROVIDER_SITE_OTHER): Payer: Medicare Other | Admitting: Orthopaedic Surgery

## 2021-10-16 ENCOUNTER — Encounter: Payer: Self-pay | Admitting: Orthopaedic Surgery

## 2021-10-16 DIAGNOSIS — M79641 Pain in right hand: Secondary | ICD-10-CM | POA: Diagnosis not present
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
## 2021-10-17 ENCOUNTER — Other Ambulatory Visit: Payer: Self-pay | Admitting: Medical

## 2021-10-17 DIAGNOSIS — R7303 Prediabetes: Secondary | ICD-10-CM | POA: Diagnosis not present

## 2021-10-17 DIAGNOSIS — I10 Essential (primary) hypertension: Secondary | ICD-10-CM

## 2021-10-17 DIAGNOSIS — R52 Pain, unspecified: Secondary | ICD-10-CM

## 2021-10-17 DIAGNOSIS — F17210 Nicotine dependence, cigarettes, uncomplicated: Secondary | ICD-10-CM | POA: Diagnosis not present

## 2021-10-23 ENCOUNTER — Telehealth: Payer: Self-pay | Admitting: Medical

## 2021-10-23 MED ORDER — CELECOXIB 100 MG PO CAPS: 100.0000 mg | ORAL_CAPSULE | Freq: Two times a day (BID) | ORAL | 2 refills | Status: DC

## 2021-10-23 NOTE — Telephone Encounter (Signed)
Medication: celecoxib (CELEBREX) 100 MG capsule  Has the patient contacted their pharmacy? Yes.   (If no, request that the patient contact the pharmacy for the refill.) (If yes, when and what did the pharmacy advise?)  Preferred Pharmacy (with phone number or street name):  [facility], [address] [place], [place] [address]  Phone:  [phone]  Fax:  [phone]  Agent: Please be advised that RX refills may take up to 3 business days. We ask that you follow-up with your pharmacy.

## 2021-10-23 NOTE — Telephone Encounter (Signed)
Rx celebrex refill sent to pt pharmacy.

## 2021-10-24 NOTE — Telephone Encounter (Signed)
Patient notified

## 2021-10-25 ENCOUNTER — Ambulatory Visit (INDEPENDENT_AMBULATORY_CARE_PROVIDER_SITE_OTHER): Payer: Medicare Other | Admitting: Physician Assistant

## 2021-10-25 ENCOUNTER — Other Ambulatory Visit: Payer: Self-pay

## 2021-10-25 ENCOUNTER — Encounter: Payer: Self-pay | Admitting: Physician Assistant

## 2021-10-25 ENCOUNTER — Ambulatory Visit: Payer: Self-pay

## 2021-10-25 ENCOUNTER — Ambulatory Visit: Payer: Medicare Other | Admitting: Medical

## 2021-10-25 DIAGNOSIS — M79674 Pain in right toe(s): Secondary | ICD-10-CM | POA: Diagnosis not present
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
## 2021-11-01 ENCOUNTER — Ambulatory Visit (HOSPITAL_BASED_OUTPATIENT_CLINIC_OR_DEPARTMENT_OTHER)
Admission: RE | Admit: 2021-11-01 | Discharge: 2021-11-01 | Disposition: A | Discharge status: Home / Self Care | Payer: Medicare Other | Source: Ambulatory Visit | Attending: Medical | Admitting: Medical

## 2021-11-01 ENCOUNTER — Other Ambulatory Visit: Payer: Self-pay

## 2021-11-01 ENCOUNTER — Ambulatory Visit (INDEPENDENT_AMBULATORY_CARE_PROVIDER_SITE_OTHER): Payer: Medicare Other | Admitting: Medical

## 2021-11-01 ENCOUNTER — Ambulatory Visit: Payer: Medicare Other | Admitting: Medical

## 2021-11-01 VITALS — BP 133/80 | HR 100 | Temp 97.5°F | Resp 18 | Ht 73.0 in | Wt 194.6 lb

## 2021-11-01 DIAGNOSIS — G8929 Other chronic pain: Secondary | ICD-10-CM

## 2021-11-01 DIAGNOSIS — M79641 Pain in right hand: Secondary | ICD-10-CM | POA: Diagnosis not present

## 2021-11-01 DIAGNOSIS — M79674 Pain in right toe(s): Secondary | ICD-10-CM

## 2021-11-01 DIAGNOSIS — M79602 Pain in left arm: Secondary | ICD-10-CM

## 2021-11-01 DIAGNOSIS — I10 Essential (primary) hypertension: Secondary | ICD-10-CM | POA: Diagnosis not present

## 2021-11-01 DIAGNOSIS — M545 Low back pain, unspecified: Secondary | ICD-10-CM | POA: Diagnosis not present
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
## 2021-11-01 NOTE — Patient Instructions (Addendum)
Rt hand/2nd digit mcp joint pain. Decreased rom. Seen by Dr. Erlinda Hong. His advise was to contact Dr. Tempie Donning if pain persist so I placed referral. I think best for specialist to decide if mri indicated. Continue voltaren and celebrex.  For rt great toe pain keep appointment with ortho in one week.  Htn- bp contolled with losartan 25 mg daily.  Low back pain is still present but less than before.  For new onset left arm pain will get xray of left humerus.  Follow up in 3 months or sooner if needed.

## 2021-11-05 ENCOUNTER — Telehealth: Payer: Self-pay | Admitting: Medical

## 2021-11-05 NOTE — Telephone Encounter (Signed)
Patient wants to change his pharmacy to [facility] on [place], [place] [address]

## 2021-11-05 NOTE — Telephone Encounter (Signed)
Pt has no current refills due , will use pharmacy at next refill date

## 2021-11-08 ENCOUNTER — Ambulatory Visit: Payer: Medicare Other | Admitting: Physician Assistant

## 2021-11-09 ENCOUNTER — Telehealth: Payer: Self-pay | Admitting: Medical

## 2021-11-09 MED ORDER — LOSARTAN POTASSIUM 25 MG PO TABS: 25.0000 mg | ORAL_TABLET | Freq: Every day | ORAL | 0 refills | Status: DC

## 2021-11-09 MED ORDER — BUPROPION HCL ER (XL) 150 MG PO TB24: 150.0000 mg | ORAL_TABLET | Freq: Every day | ORAL | 0 refills | Status: DC

## 2021-11-09 MED ORDER — CYCLOBENZAPRINE HCL 10 MG PO TABS: ORAL_TABLET | ORAL | 0 refills | Status: DC

## 2021-11-09 MED ORDER — GABAPENTIN 800 MG PO TABS: 800.0000 mg | ORAL_TABLET | Freq: Two times a day (BID) | ORAL | 0 refills | Status: DC

## 2021-11-09 MED ORDER — CELECOXIB 100 MG PO CAPS: 100.0000 mg | ORAL_CAPSULE | Freq: Two times a day (BID) | ORAL | 2 refills | Status: DC

## 2021-11-09 MED ORDER — FAMOTIDINE 20 MG PO TABS: 20.0000 mg | ORAL_TABLET | Freq: Every day | ORAL | 0 refills | Status: DC

## 2021-11-09 MED ORDER — TADALAFIL 20 MG PO TABS: ORAL_TABLET | ORAL | 3 refills | Status: DC

## 2021-11-09 NOTE — Telephone Encounter (Signed)
Rx sent 

## 2021-11-09 NOTE — Telephone Encounter (Signed)
pt would like all meds transferred to [facility] - [address] [place] [address]

## 2021-11-16 ENCOUNTER — Other Ambulatory Visit: Payer: Self-pay | Admitting: Medical

## 2021-12-01 ENCOUNTER — Other Ambulatory Visit: Payer: Self-pay | Admitting: Medical

## 2021-12-03 ENCOUNTER — Ambulatory Visit (HOSPITAL_BASED_OUTPATIENT_CLINIC_OR_DEPARTMENT_OTHER)
Admission: RE | Admit: 2021-12-03 | Discharge: 2021-12-03 | Disposition: A | Discharge status: Home / Self Care | Payer: Medicare Other | Source: Ambulatory Visit | Attending: Medical | Admitting: Medical

## 2021-12-03 ENCOUNTER — Other Ambulatory Visit: Payer: Self-pay

## 2021-12-03 ENCOUNTER — Ambulatory Visit (INDEPENDENT_AMBULATORY_CARE_PROVIDER_SITE_OTHER): Payer: Medicare Other | Admitting: Medical

## 2021-12-03 VITALS — BP 117/72 | HR 78 | Resp 18 | Ht 73.0 in | Wt 194.0 lb

## 2021-12-03 DIAGNOSIS — R0781 Pleurodynia: Secondary | ICD-10-CM | POA: Diagnosis present

## 2021-12-03 DIAGNOSIS — K921 Melena: Secondary | ICD-10-CM

## 2021-12-03 DIAGNOSIS — I10 Essential (primary) hypertension: Secondary | ICD-10-CM

## 2021-12-03 NOTE — Patient Instructions (Addendum)
Htn- bp well controlled. Continue losartan.  Rt side rib pain for 2 weeks. Improving but still present. Probable muscle pain. Will get rt rib xray today. Can use tylenol and lidocaine salon pas patch. If you see any rash on your skin let me know as shingles in differential.  For blood in stool referring you back to your GI MD. If you get recurrence or other signs/symptoms during the interim let us know.  Follow up 1 month or sooner if needed.
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
## 2021-12-28 ENCOUNTER — Other Ambulatory Visit: Payer: Self-pay

## 2021-12-28 ENCOUNTER — Encounter: Payer: Self-pay | Admitting: Gastroenterology

## 2021-12-28 ENCOUNTER — Ambulatory Visit (INDEPENDENT_AMBULATORY_CARE_PROVIDER_SITE_OTHER): Payer: Medicare Other | Admitting: Gastroenterology

## 2021-12-28 VITALS — BP 128/86 | HR 94 | Ht 73.0 in | Wt 193.5 lb

## 2021-12-28 DIAGNOSIS — K921 Melena: Secondary | ICD-10-CM | POA: Diagnosis not present

## 2021-12-28 DIAGNOSIS — K648 Other hemorrhoids: Secondary | ICD-10-CM

## 2021-12-28 DIAGNOSIS — Z86010 Personal history of colonic polyps: Secondary | ICD-10-CM | POA: Diagnosis not present

## 2021-12-28 MED ORDER — CLENPIQ 10-3.5-12 MG-GM -GM/160ML PO SOLN: 1.0000 | ORAL | 0 refills | Status: DC
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
## 2021-12-28 NOTE — Patient Instructions (Signed)
If you are age 57 or older, your body mass index should be between 23-30. Your Body mass index is 25.53 kg/m. If this is out of the aforementioned range listed, please consider follow up with your Primary Care Provider.  If you are age 58 or younger, your body mass index should be between 19-25. Your Body mass index is 25.53 kg/m. If this is out of the aformentioned range listed, please consider follow up with your Primary Care Provider.   __________________________________________________________  The [facility] providers would like to encourage you to use [facility] to communicate with providers for non-urgent requests or questions.  Due to long hold times on the telephone, sending your provider a message by [facility] may be a faster and more efficient way to get a response.  Please allow 48 business hours for a response.  Please remember that this is for non-urgent requests.  ________________________________________________________  Due to recent changes in healthcare laws, you may see the results of your imaging and laboratory studies on MyChart before your provider has had a chance to review them.  We understand that in some cases there may be results that are confusing or concerning to you. Not all laboratory results come back in the same time frame and the provider may be waiting for multiple results in order to interpret others.  Please give [place] 48 hours in order for your provider to thoroughly review all the results before contacting the office for clarification of your results.   We have sent the following medications to your pharmacy for you to pick up at your convenience:  Clenpiq with coupon for your colonoscopy  Thank you for choosing me and [facility].  Vito Cirigliano, D.O.

## 2022-01-04 ENCOUNTER — Ambulatory Visit (INDEPENDENT_AMBULATORY_CARE_PROVIDER_SITE_OTHER): Payer: Medicare Other | Admitting: Medical

## 2022-01-04 ENCOUNTER — Encounter: Payer: Self-pay | Admitting: Medical

## 2022-01-04 VITALS — BP 130/80 | HR 97 | Ht 73.0 in | Wt 192.0 lb

## 2022-01-04 DIAGNOSIS — Z136 Encounter for screening for cardiovascular disorders: Secondary | ICD-10-CM | POA: Diagnosis not present

## 2022-01-04 DIAGNOSIS — I10 Essential (primary) hypertension: Secondary | ICD-10-CM

## 2022-01-04 DIAGNOSIS — M545 Low back pain, unspecified: Secondary | ICD-10-CM

## 2022-01-04 DIAGNOSIS — F17200 Nicotine dependence, unspecified, uncomplicated: Secondary | ICD-10-CM | POA: Diagnosis not present

## 2022-01-04 DIAGNOSIS — G8929 Other chronic pain: Secondary | ICD-10-CM

## 2022-01-04 DIAGNOSIS — E785 Hyperlipidemia, unspecified: Secondary | ICD-10-CM

## 2022-01-04 MED ORDER — TADALAFIL 20 MG PO TABS: ORAL_TABLET | ORAL | 3 refills | Status: DC
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
## 2022-01-04 NOTE — Patient Instructions (Addendum)
Hypertension-blood pressure did come down after rechecking.  Blood pressure was 130/80.  You were formally on losartan 25 mg.  I would recommend that you check your blood pressure 3-4 times over the next week to make sure blood pressure consistently less than 140/90.  Smoker-do recommend that you continue to try to quit smoking completely.  Placed order for CT chest screening lung cancer based on at least 20-year pack a day smoking.  For chronic back pain continue with Celebrex and Flexeril.  If pain worsens as he had prior to surgery could refer you to pain management.  GERD-continue famotidine.  Follow through with your screening colonoscopy.   Went ahead and placed order for abdomen ultrasound.  I placed that order under hypertension, smoking and screening abdominal aortic aneurysm.   Hyperlipidemia-placed future order for fasting CMP with lipid panel.  For ED. Refilled cialis  Follow-up in 2 months or sooner if needed.

## 2022-01-06 ENCOUNTER — Other Ambulatory Visit: Payer: Self-pay | Admitting: Medical

## 2022-01-07 ENCOUNTER — Telehealth: Payer: Self-pay

## 2022-01-07 NOTE — Telephone Encounter (Signed)
Script was sent on 01/04/22

## 2022-01-07 NOTE — Telephone Encounter (Signed)
Nurse Assessment Nurse: Phoebe Perch, RN, Dagoberto Date/Time Eilene Ghazi Time): 01/06/2022 12:32:12 PM Confirm and document reason for call. If symptomatic, describe symptoms. ---Caller states his refill was not sent in. Saw PCP on Friday. Medication is Cialis. Takes it for erectile dysfunction. Currently has no new or worsening symptoms. Does the patient have any new or worsening symptoms? ---No Please document clinical information provided and list any resource used. ---Advised caller to follow up with PCP office tomorrow. Caller verbalized understanding. Disp. Time Eilene Ghazi Time) Disposition Final User 01/06/2022 12:13:27 PM Send To Nurse Lennox Laity, RN, Olin Hauser 01/06/2022 12:13:35 PM Send To Nurse Lennox Laity, RN, Olin Hauser 01/06/2022 12:13:47 PM Send To Nurse Lennox Laity, RN, Olin Hauser 01/06/2022 12:35:50 PM Attempt made - message left Cervantes, RN, Dagoberto 01/06/2022 12:40:51 PM Clinical Call Yes Phoebe Perch, RN, Dagoberto

## 2022-01-11 ENCOUNTER — Other Ambulatory Visit (INDEPENDENT_AMBULATORY_CARE_PROVIDER_SITE_OTHER): Payer: Medicare Other

## 2022-01-11 ENCOUNTER — Ambulatory Visit (HOSPITAL_BASED_OUTPATIENT_CLINIC_OR_DEPARTMENT_OTHER)
Admission: RE | Admit: 2022-01-11 | Discharge: 2022-01-11 | Disposition: A | Discharge status: Home / Self Care | Payer: Medicare Other | Source: Ambulatory Visit | Attending: Medical | Admitting: Medical

## 2022-01-11 ENCOUNTER — Other Ambulatory Visit: Payer: Self-pay

## 2022-01-11 DIAGNOSIS — J439 Emphysema, unspecified: Secondary | ICD-10-CM | POA: Diagnosis not present

## 2022-01-11 DIAGNOSIS — Z122 Encounter for screening for malignant neoplasm of respiratory organs: Secondary | ICD-10-CM | POA: Diagnosis present

## 2022-01-11 DIAGNOSIS — F17200 Nicotine dependence, unspecified, uncomplicated: Secondary | ICD-10-CM

## 2022-01-11 DIAGNOSIS — I700 Atherosclerosis of aorta: Secondary | ICD-10-CM | POA: Insufficient documentation

## 2022-01-11 DIAGNOSIS — F17210 Nicotine dependence, cigarettes, uncomplicated: Secondary | ICD-10-CM | POA: Insufficient documentation

## 2022-01-11 DIAGNOSIS — I10 Essential (primary) hypertension: Secondary | ICD-10-CM | POA: Diagnosis not present

## 2022-01-11 DIAGNOSIS — E785 Hyperlipidemia, unspecified: Secondary | ICD-10-CM | POA: Diagnosis not present

## 2022-01-11 DIAGNOSIS — I2510 Atherosclerotic heart disease of native coronary artery without angina pectoris: Secondary | ICD-10-CM | POA: Insufficient documentation

## 2022-01-11 LAB — COMPREHENSIVE METABOLIC PANEL
ALT: 25 U/L (ref 0–53)
AST: 27 U/L (ref 0–37)
Albumin: 4.2 g/dL (ref 3.5–5.2)
Alkaline Phosphatase: 63 U/L (ref 39–117)
BUN: 15 mg/dL (ref 6–23)
CO2: 30 mEq/L (ref 19–32)
Calcium: 9 mg/dL (ref 8.4–10.5)
Chloride: 103 mEq/L (ref 96–112)
Creatinine, Ser: 0.97 mg/dL (ref 0.40–1.50)
GFR: 87.34 mL/min (ref >60.00)
Glucose, Bld: 93 mg/dL (ref 70–99)
Potassium: 3.9 mEq/L (ref 3.5–5.1)
Sodium: 136 mEq/L (ref 135–145)
Total Bilirubin: 0.6 mg/dL (ref 0.2–1.2)
Total Protein: 7 g/dL (ref 6.0–8.3)

## 2022-01-11 LAB — LIPID PANEL
Cholesterol: 146 mg/dL (ref 0–200)
HDL: 46.5 mg/dL (ref >39.00)
LDL Cholesterol: 89 mg/dL (ref 0–99)
NonHDL: 99.17
Total CHOL/HDL Ratio: 3
Triglycerides: 50 mg/dL (ref 0.0–149.0)
VLDL: 10 mg/dL (ref 0.0–40.0)

## 2022-01-14 ENCOUNTER — Telehealth: Payer: Medicare Other

## 2022-01-14 MED ORDER — ATORVASTATIN CALCIUM 10 MG PO TABS: 10.0000 mg | ORAL_TABLET | Freq: Every day | ORAL | 3 refills | Status: DC

## 2022-01-14 NOTE — Addendum Note (Signed)
Addended by: Anabel Halon on: 01/14/2022 07:42 PM   Modules accepted: Orders

## 2022-01-15 ENCOUNTER — Other Ambulatory Visit: Payer: Self-pay | Admitting: Medical

## 2022-01-16 HISTORY — PX: OTHER SURGICAL HISTORY: SHX169

## 2022-01-18 ENCOUNTER — Telehealth: Payer: Medicare Other

## 2022-01-22 ENCOUNTER — Ambulatory Visit (INDEPENDENT_AMBULATORY_CARE_PROVIDER_SITE_OTHER): Payer: Medicare Other | Admitting: Pharmacist

## 2022-01-22 DIAGNOSIS — I10 Essential (primary) hypertension: Secondary | ICD-10-CM

## 2022-01-22 DIAGNOSIS — E785 Hyperlipidemia, unspecified: Secondary | ICD-10-CM

## 2022-01-22 DIAGNOSIS — F17200 Nicotine dependence, unspecified, uncomplicated: Secondary | ICD-10-CM

## 2022-01-22 NOTE — Chronic Care Management (AMB) (Addendum)
Chronic Care Management Pharmacy Note  01/22/2022 Name:  Cory Benson MRN:  [id number] DOB:  05/03/1966  Summary:  Reviewed blood pressure and has been well controlled lately. Reviewed medication list with patient and refill history. Adherence is good.  Will close out case for now. Patient is aware he can call at anytime to reactivate Chronic Care Management team if needed.   Subjective: Cory Benson is an 56 y.o. year old male who is a primary patient of [name], [name], [place].  The CCM team was consulted for assistance with disease management and care coordination needs.    Engaged with patient by telephone for follow up visit in response to provider referral for pharmacy case management and/or care coordination services.   Consent to Services:  The patient was given information about Chronic Care Management services, agreed to services, and gave verbal consent prior to initiation of services.  Please see initial visit note for detailed documentation.   Patient Care Team: Elise Benne as PCP - General (Internal Medicine)  Recent office visits: 01/04/2022 - PCP ([place], [place]) F/U HTN. Recommended check BP 3 to 4 times over the next week - goal < 140/90. D/C smoking and ordered CT checked for lung cancer screening. Chronic back pain - continue celebrex and flexeril. Ordered abdomena ultrasound for screening for abdominal aortic aneurysm. 10/05/2021 - Fam Med ([name], PAC) F/U pain - hip, back and hand. Prescribed celebrex 158m twice a day. Referred to Dr XErlinda Hongfor hand pain. Referred to Podiatry.  09/07/2021 - [place] for [place] and hand pain. Hip pain improved after injection. right hand pain - xray ordered. Constipation - recommedned OTC Miralax or Dulcolax if 2 days passes with no BM.  08/08/2021 - Fam Med ([name], PAC) F/U chronic conditions. Continue PT for back pain; Prescribed Keflex 5072mtiwce a day for 10 dyas  for folliculitis versus keloid on chin.  Recommended high fiber foods and hydration for constipation / hemorrhoid.    Recent consult visits: 12/19/2021 - GI (Dr CiBryan LemmaSeen for hematochezia and BRBPR. Ordered colonoscopy 10/09/2021 - Podiatry (Dr McSherryle LisSeen for dystrophic nails. Debrided nail. 09/20/2021 - Ortho (Dr StRowe RobertF/U left hip pain. Too early for next steroid injection in hip. Planned for December 2022.  07/20/2021 - Ortho (Dr HaLanae Boast Osborne Omanseen for OA of left hip. Received injection of triamcinolone in left hip joint.    Hospital visits: None in the previous 6 months  Objective:  Lab Results  Component Value Date   CREATININE 0.97 01/11/2022   CREATININE 1.08 08/08/2021   CREATININE 1.02 06/27/2021    Lab Results  Component Value Date   HGBA1C 6.0 01/21/2020   Last diabetic Eye exam: No results found for: HMDIABEYEEXA  Last diabetic Foot exam: No results found for: HMDIABFOOTEX      Component Value Date/Time   CHOL 146 01/11/2022 1007   TRIG 50.0 01/11/2022 1007   HDL 46.50 01/11/2022 1007   CHOLHDL 3 01/11/2022 1007   VLDL 10.0 01/11/2022 1007   LDLCALC 89 01/11/2022 1007   LDLCALC 84 10/01/2019 1423    Hepatic Function Latest Ref Rng & Units 01/11/2022 08/08/2021 06/27/2021  Total Protein 6.0 - 8.3 g/dL 7.0 6.8 7.3  Albumin 3.5 - 5.2 g/dL 4.2 3.8 4.1  AST 0 - 37 U/L 27 19 26   ALT 0 - 53 U/L 25 19 25   Alk Phosphatase 39 - 117 U/L 63 59 64  Total Bilirubin 0.2 - 1.2 mg/dL 0.6 0.4  0.4    No results found for: TSH, FREET4  CBC Latest Ref Rng & Units 06/27/2021 01/21/2020 10/01/2019  WBC 4.0 - 10.5 K/uL 4.8 7.9 5.7  Hemoglobin 13.0 - 17.0 g/dL 13.4 14.5 13.9  Hematocrit 39.0 - 52.0 % 40.3 42.7 40.6  Platelets 150.0 - 400.0 K/uL 196.0 242.0 206    No results found for: VD25OH  Clinical ASCVD: No  The 10-year ASCVD risk score (Arnett DK, et al., 2019) is: 18.2%   Values used to calculate the score:     Age: 84 years     Sex: Male     Is Non-Hispanic African American: Yes      Diabetic: No     Tobacco smoker: Yes     Systolic Blood Pressure: 557 mmHg     Is BP treated: Yes     HDL Cholesterol: 46.5 mg/dL     Total Cholesterol: 146 mg/dL      Social History   Tobacco Use  Smoking Status Every Day   Packs/day: 0.50   Years: 40.00   Pack years: 20.00   Types: Cigarettes   Start date: 11/22/1980  Smokeless Tobacco Never  Tobacco Comments   At least 20 years pack a day.   BP Readings from Last 3 Encounters:  01/04/22 130/80  12/28/21 128/86  12/03/21 117/72   Pulse Readings from Last 3 Encounters:  01/04/22 97  12/28/21 94  12/03/21 78   Wt Readings from Last 3 Encounters:  01/04/22 192 lb (87.1 kg)  12/28/21 193 lb 8 oz (87.8 kg)  12/03/21 194 lb (88 kg)    Assessment: Review of patient past medical history, allergies, medications, health status, including review of consultants reports, laboratory and other test data, was performed as part of comprehensive evaluation and provision of chronic care management services.   SDOH:  (Social Determinants of Health) assessments and interventions performed:  SDOH Interventions    Flowsheet Row Most Recent Value  SDOH Interventions   Financial Strain Interventions Intervention Not Indicated        CCM Care Plan  No Known Allergies  Medications Reviewed Today     Reviewed by Cherre Robins, RPH-CPP (Pharmacist) on 01/22/22 at 31  Med List Status: <None>   Medication Order Taking? Sig Documenting Provider Last Dose Status Informant  atorvastatin (LIPITOR) 10 MG tablet [id number] Yes Take 1 tablet (10 mg total) by mouth daily. [name], [name], [name] Taking Active   buPROPion (WELLBUTRIN XL) 150 MG 24 hr tablet [id number] Yes Take 1 tablet (150 mg total) by mouth daily. [name], [name], [name] Taking Active   celecoxib (CELEBREX) 100 MG capsule [id number] Yes Take 1 capsule (100 mg total) by mouth 2 (two) times daily. [name], [name], [name] Taking Active   cyclobenzaprine (FLEXERIL) 10 MG tablet  [id number] Yes TAKE 1 TABLET BY MOUTH TWICE DAILY FOR MUSCLE SPASM [name], [name], [name] Taking Active   famotidine (PEPCID) 20 MG tablet [id number] Yes Take 1 tablet (20 mg total) by mouth daily. [name], [name], [name] Taking Active   gabapentin (NEURONTIN) 800 MG tablet [id number] No Take 1 tablet (800 mg total) by mouth 2 (two) times daily.  Patient not taking: Reported on 01/22/2022   Mackie Pai, [name] Not Taking Active   Sod Picosulfate-Mag Ox-Cit [facility]) 10-3.5-12 MG-GM -GM/160ML Bailey Mech [id number] Yes Take 1 kit by mouth as directed. Cirigliano, Vito V, DO Taking Active   tadalafil (CIALIS) 20 MG tablet [id number] Yes TAKE 1/2 TO 1 (ONE-HALF TO ONE) TABLET BY MOUTH PRIOR  TO  SEX [name], Iris Pert Taking Active             Patient Active Problem List   Diagnosis Date Noted   Lipoma of torso 12/19/2020   Carpal tunnel syndrome on right 11/21/2020   Facet hypertrophy of cervical region 05/30/2020   Degenerative tear of medial meniscus of left knee 02/10/2020   Facet arthritis of lumbar region 12/31/2019   Spinal stenosis of lumbar region without neurogenic claudication 12/31/2019   Chronic bilateral low back pain with right-sided sciatica 11/22/2019    Immunization History  Administered Date(s) Administered   HPV 9-valent 08/08/2021   Tdap 06/27/2021    Conditions to be addressed/monitored: HTN and pre DM; pain; smoking cessation  Care Plan : General Pharmacy (Adult)  Updates made by Cherre Robins, RPH-CPP since 01/22/2022 12:00 AM     Problem: HTN, PreDM: GERD; pain   Priority: High  Onset Date: 01/05/2021     Long-Range Goal: Provide education, support and care coordination for medication therapy and chronic conditions Completed 01/22/2022  Start Date: 01/05/2021  Expected End Date: 07/05/2021  Recent Progress: On track  Priority: High  Note:   Current Barriers:  Unable to achieve control of back or hip pain (improved - no longer a barrier)  Recently restarted  smoking - has quit after surgery May 2022.  Providing ongoing support and encouragement  Pharmacist Clinical Goal(s):  Over the next 120 days, patient will achieve adherence to monitoring guidelines and medication adherence to achieve therapeutic efficacy Assist with smoking cessation efforts Work to control pain through collaboration with PharmD and provider.   Interventions: 1:1 collaboration with [name], [name], [name] regarding development and update of comprehensive plan of care as evidenced by provider attestation and co-signature Inter-disciplinary care team collaboration (see longitudinal plan of care) Comprehensive medication review performed; medication list updated in electronic medical record  Hypertension (BP goal <140/90) Controlled Current treatment: Losartan 81m daily Medications previously tried: none noted  Current home readings: 131/75 Denies hypotensive/hypertensive symptoms Reviwed refill history for losartan - has filled for 90 DS 2/28, 5/26 and 08/06/2021. Patient reports since he has surgery and pain is improving his blood pressure has been at goal and is not taking losartan every day Interventions:  Reviewed BP goals and benefits of medications for prevention of heart attack, stroke and kidney damage; Continue to monitor BP at home a few times per week, document, and provide log at future appointments  Pre Diabetes (A1c goal <6.5%)  Lab Results  Component Value Date   HGBA1C 6.0 01/21/2020  Controlled Current medications: None at this time Medications previously tried: none noted  Current home glucose readings - not checking Denies hypoglycemic/hyperglycemic symptoms Interventions (addressed at previous visits)  Educated onA1c and blood sugar goals; Counseled to check feet daily and get yearly eye exams Counseled on diet and exercise extensively Recommended to continue current medication  Tobacco use (Goal continue smoking  cessation) Controlled Current treatment  Bupropion XL 1548mdaily Restarted smoking.  Interventions:  Recommended recommit to not smoking.  Reviewed smoking cessation benefits.  Continue bupropion daily to help with cravings.   Pain (Goal: Manage symptoms) Improving control.  Current treatment  Celecoxib 10072m take 1 capsules twice a day (patient is only taking once daily) Flexeril 14m4mn Gabapentin 800mg54m prn Medications previously tried: hydrocodone/ APAP Patient has been referred to pain management - but cancelled appt for 07/2021.  Patient to get steroid injection in hip 10/26/2021. Last injection helped pain in hip  a lot.  Interventions:  Recommended use 280m / day of celecoxib when having more pain but Ok to use just 1026mdaily on days with less pain.    Patient Goals/Self-Care Activities Over the next 90 days, patient will:  Take medications as prescribed check blood pressure 2 to 3 times per week, document, and provide at future appointments Consider starting exercise program if approved by pain management / physical therapist.  Follow Up Plan:no Chronic Care Management follow up planned. Patient case will be closed out but he is aware he can reconnect with Chronic Care Management team / clinical pharmacist at any time.       Medication Assistance: None required.  Patient affirms current coverage meets needs.  Patient's preferred pharmacy is:  [facility] [address] [place] [id number]: [phone]: [address]: [phone]: [phone] Uses pill box? No -   Pt endorses 99% compliance   Plan: The patient has been provided with contact information for the care management team and has been advised to call with any  health related questions or concerns.  No further follow up required: at this time  TaCherre RobinsPharmD Clinical Pharmacist [facility] [place] have personally reviewed this encounter including the documentation in this note and have collaborated with the care management provider regarding care management and care coordination activities to include development and update of the comprehensive care plan. I am certifying that I agree with the content of this note and encounter as supervising provider   EdMackie PaiPA-C

## 2022-01-22 NOTE — Patient Instructions (Signed)
Mr. Cory Benson ?It was a pleasure speaking with you today.  ?I have attached a summary of our visit today and information about your health goals. (See below)  ?You are doing well and taking / filling medications on time. I am closing out your Chronic Care Management case since you are doing so well however if you have medication questions or concerns please call me. We can reactivate your case at anytime.  ? ?Keep up the good work! ? ?Cherre Robins, PharmD ?Clinical Pharmacist ?[facility] ?[place] ?[phone] (direct line)  ?[phone] (main office number) ? ?CARE PLAN ENTRY ?(see longitudinal plan of care for additional care plan information) ? ?Current Barriers:  ?Chronic Disease Management support, education, and care coordination needs related to Tobacco Use Disorder, Pre-Diabetes, Hypertension, Pain, GERD  ?  ?Hypertension ?BP Readings from Last 3 Encounters:  ?01/04/22 130/80  ?12/28/21 128/86  ?12/03/21 117/72  ? ?Pharmacist Clinical Goal(s): ?Over the next 90 days, patient will work with PharmD and providers to maintain BP goal <140/90 ?Current regimen:  ?Losartan '25mg'$  daily ?Interventions: ?Requested patient to purchase a blood pressure cuff (completed) ?Requested patient to check blood pressure 2-3 times per week  ?Patient self care activities - Over the next 90 days, patient will: ?Restart losartan '25mg'$  daily - has discussion with PCP regarding if you can stop losartan or not.  ?Check blood presure 2 to 3 times per week, document, and provide at future appointments ?Ensure daily salt intake < 2300 mg/day ? ?Pre-Diabetes ?Lab Results  ?Component Value Date/Time  ? HGBA1C 6.0 01/21/2020 11:25 AM  ? ?Pharmacist Clinical Goal(s): ?Over the next 90 days, patient will work with PharmD and providers to maintain A1c goal <6.5% ?Current regimen:  ?Diet and exercise management   ?Interventions: ?Discussed importance of limiting carbohydrates ?Patient self care activities - Over the next 180  days, patient will: ?Limit carbohydrates to 45-60 grams per meal ?Due to have A1c rechecked.  ? ?Tobacco Use Disorder ?Pharmacist Clinical Goal(s) ?Over the next 90 days, patient will work with PharmD and providers to stop smoking ?Current regimen:  ?Bupropion XL '150mg'$  daily ?Patient self care activities - Over the next 90 days, patient will: ?Continue bupropion to help with smoking cessation ?Plan to decrease number of cigarettes smoked to 1 or 2 per day for the first 3 to 7 days of restarting bupropion. ? ?Pain (Goal: Manage symptoms) ?Current treatment  ?Celecoxib '100mg'$  twice a day ?Flexeril '10mg'$  prn ?Gabapentin '800mg'$  bid prn ?Interventions:  ?Continue current regimen for pain control ? ?Medication management ?Pharmacist Clinical Goal(s): ?Over the next 90 days, patient will work with PharmD and providers to maintain optimal medication adherence ?Current pharmacy: [facility]  ?Interventions ?Comprehensive medication review performed. ?Continue current medication management strategy ?Patient self care activities - Over the next 90 days, patient will: ?Focus on medication adherence by filling medications appropriately ?Take medications as prescribed ?Report any questions or concerns to PharmD and/or provider(s) ? ? ?Patient Goals/Self-Care Activities ?Over the next 90 days, patient will:  ?Take medications as prescribed ?check blood pressure 2 to 3 times per week, document, and provide at future appointments ?Consider starting exercise program if approved by pain management / physical therapist. ? ?Follow Up Plan: no Chronic Care Management follow up planned. Patient case will be closed out but he is aware he can reconnect with Chronic Care Management team / clinical pharmacist at any time. ? ?The patient verbalized understanding of instructions, educational materials, and care plan provided today and agreed  to receive a mailed copy of patient instructions, educational materials, and care plan.   ?

## 2022-01-29 ENCOUNTER — Encounter: Payer: Self-pay | Admitting: Certified Registered Nurse Anesthetist

## 2022-01-30 ENCOUNTER — Encounter: Payer: Self-pay | Admitting: Gastroenterology

## 2022-01-30 ENCOUNTER — Other Ambulatory Visit: Payer: Self-pay

## 2022-01-30 ENCOUNTER — Other Ambulatory Visit (HOSPITAL_COMMUNITY): Payer: Medicare Other

## 2022-01-30 ENCOUNTER — Other Ambulatory Visit: Payer: Self-pay | Admitting: Gastroenterology

## 2022-01-30 ENCOUNTER — Telehealth: Payer: Self-pay

## 2022-01-30 ENCOUNTER — Ambulatory Visit (AMBULATORY_SURGERY_CENTER): Payer: Medicare Other | Admitting: Gastroenterology

## 2022-01-30 ENCOUNTER — Ambulatory Visit: Payer: Medicare Other | Admitting: Medical

## 2022-01-30 VITALS — BP 148/93 | HR 58 | Temp 97.3°F | Resp 18 | Ht 73.0 in | Wt 193.0 lb

## 2022-01-30 DIAGNOSIS — K921 Melena: Secondary | ICD-10-CM

## 2022-01-30 DIAGNOSIS — D122 Benign neoplasm of ascending colon: Secondary | ICD-10-CM

## 2022-01-30 DIAGNOSIS — K621 Rectal polyp: Secondary | ICD-10-CM

## 2022-01-30 DIAGNOSIS — K641 Second degree hemorrhoids: Secondary | ICD-10-CM

## 2022-01-30 DIAGNOSIS — D129 Benign neoplasm of anus and anal canal: Secondary | ICD-10-CM

## 2022-01-30 DIAGNOSIS — K5730 Diverticulosis of large intestine without perforation or abscess without bleeding: Secondary | ICD-10-CM | POA: Diagnosis not present

## 2022-01-30 DIAGNOSIS — D125 Benign neoplasm of sigmoid colon: Secondary | ICD-10-CM

## 2022-01-30 DIAGNOSIS — Z86010 Personal history of colonic polyps: Secondary | ICD-10-CM

## 2022-01-30 MED ORDER — SODIUM CHLORIDE 0.9 % IV SOLN: 500.0000 mL | Freq: Once | INTRAVENOUS | Status: DC

## 2022-01-30 NOTE — Progress Notes (Signed)
Called to room to assist during endoscopic procedure.  Patient ID and intended procedure confirmed with present staff. Received instructions for my participation in the procedure from the performing physician.  

## 2022-01-30 NOTE — Telephone Encounter (Signed)
Called the patient and LVM to schedule OV for hemorrhoid banding. ?
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
## 2022-01-30 NOTE — Progress Notes (Signed)
Report given to PACU, vss 

## 2022-01-30 NOTE — Op Note (Signed)
[facility] ?[name] ?Procedure Date: 01/30/2022 11:11 AM ?MRN: [id number] ?Endoscopist: Gerrit Heck , MD ?Age: 56 ?Referring MD:  ?Date of Birth: [birth year] ?Gender: Male ?Account #: [id number] ?Procedure:                Colonoscopy ?Indications:              Surveillance: Personal history of adenomatous  ?                          polyps on last colonoscopy 3 years ago ?                          Additionally, he has been havign intermittent  ?                          painless hematochezia recently. ?                          Colonoscopy (10/29/2019): 3 subcentimeter polyps  ?                          (path: TA x3), 5 mm rectal adenoma, benign rectal  ?                          hyperplastic polyps, internal hemorrhoids. Normal  ?                          TI. ?Medicines:                Monitored Anesthesia Care ?Procedure:                Pre-Anesthesia Assessment: ?                          - Prior to the procedure, a History and Physical  ?                          was performed, and patient medications and  ?                          allergies were reviewed. The patient's tolerance of  ?                          previous anesthesia was also reviewed. The risks  ?                          and benefits of the procedure and the sedation  ?                          options and risks were discussed with the patient.  ?                          All questions were answered, and informed consent  ?                          was obtained. Prior Anticoagulants:  The patient has  ?                          taken no previous anticoagulant or antiplatelet  ?                          agents. ASA Grade Assessment: II - A patient with  ?                          mild systemic disease. After reviewing the risks  ?                          and benefits, the patient was deemed in  ?                          satisfactory condition to undergo the procedure. ?                          After obtaining  informed consent, the colonoscope  ?                          was passed under direct vision. Throughout the  ?                          procedure, the patient's blood pressure, pulse, and  ?                          oxygen saturations were monitored continuously. The  ?                          Olympus CF-HQ190L (Serial# [id number]) Colonoscope was  ?                          introduced through the anus and advanced to the the  ?                          cecum, identified by appendiceal orifice and  ?                          ileocecal valve. The colonoscopy was performed  ?                          without difficulty. The patient tolerated the  ?                          procedure well. The quality of the bowel  ?                          preparation was good. The ileocecal valve,  ?                          appendiceal orifice, and rectum were photographed. ?Scope In: 11:16:34 AM ?Scope Out: 11:38:02 AM ?Scope Withdrawal Time: 0 hours 18 minutes 9 seconds  ?Total Procedure Duration: 0 hours 21 minutes  28 seconds  ?Findings:                 Hemorrhoids were found on perianal exam. ?                          Two sessile polyps were found in the sigmoid colon  ?                          and ascending colon. The polyps were 4 to 5 mm in  ?                          size. These polyps were removed with a cold snare.  ?                          Resection and retrieval were complete. Estimated  ?                          blood loss was minimal. ?                          Four sessile polyps were found in the rectum and  ?                          recto-sigmoid colon. The polyps were 2 to 3 mm in  ?                          size. These polyps were removed with a cold snare.  ?                          Resection and retrieval were complete. Estimated  ?                          blood loss was minimal. ?                          A few small-mouthed diverticula were found in the  ?                          sigmoid colon. ?                           Non-bleeding internal hemorrhoids were found during  ?                          retroflexion. The hemorrhoids were small and Grade  ?                          II (internal hemorrhoids that prolapse but reduce  ?                          spontaneously). ?Complications:            No immediate complications. ?Estimated Blood Loss:     Estimated blood loss was minimal. ?Impression:               -  Hemorrhoids found on perianal exam. ?                          - Two 4 to 5 mm polyps in the sigmoid colon and in  ?                          the ascending colon, removed with a cold snare.  ?                          Resected and retrieved. ?                          - Four 2 to 3 mm polyps in the rectum and at the  ?                          recto-sigmoid colon, removed with a cold snare.  ?                          Resected and retrieved. ?                          - Diverticulosis in the sigmoid colon. ?                          - Non-bleeding internal hemorrhoids. ?Recommendation:           - Patient has a contact number available for  ?                          emergencies. The signs and symptoms of potential  ?                          delayed complications were discussed with the  ?                          patient. Return to normal activities tomorrow.  ?                          Written discharge instructions were provided to the  ?                          patient. ?                          - Resume previous diet. ?                          - Continue present medications. ?                          - Await pathology results. ?                          - Repeat colonoscopy for surveillance based on  ?  pathology results. ?                          - Internal hemorrhoids were noted on this study and  ?                          may be amenable to hemorrhoid band ligation. If you  ?                          are interested in further treatment of these  ?                           hemorrhoids with band ligation, please contact my  ?                          clinic to set up an appointment for evaluation and  ?                          treatment. ?Gerrit Heck, MD ?01/30/2022 11:44:15 AM ?

## 2022-01-30 NOTE — Progress Notes (Signed)
Pt's states no medical or surgical changes since previsit or office visit.  ° °VS DT °

## 2022-01-30 NOTE — Patient Instructions (Signed)
Handouts on diverticulosis, hemorrhoids, and polyps given to patient.  ?Return to previous diet and continue present medications. ?Await pathology results.  ?Repeat colonoscopy will be determined based off of pathology results.  ? ? ? ?YOU HAD AN ENDOSCOPIC PROCEDURE TODAY AT [facility]:   Refer to the procedure report that was given to you for any specific questions about what was found during the examination.  If the procedure report does not answer your questions, please call your gastroenterologist to clarify.  If you requested that your care partner not be given the details of your procedure findings, then the procedure report has been included in a sealed envelope for you to review at your convenience later. ? ?YOU SHOULD EXPECT: Some feelings of bloating in the abdomen. Passage of more gas than usual.  Walking can help get rid of the air that was put into your GI tract during the procedure and reduce the bloating. If you had a lower endoscopy (such as a colonoscopy or flexible sigmoidoscopy) you may notice spotting of blood in your stool or on the toilet paper. If you underwent a bowel prep for your procedure, you may not have a normal bowel movement for a few days. ? ?Please Note:  You might notice some irritation and congestion in your nose or some drainage.  This is from the oxygen used during your procedure.  There is no need for concern and it should clear up in a day or so. ? ?SYMPTOMS TO REPORT IMMEDIATELY: ? ?Following lower endoscopy (colonoscopy or flexible sigmoidoscopy): ? Excessive amounts of blood in the stool ? Significant tenderness or worsening of abdominal pains ? Swelling of the abdomen that is new, acute ? Fever of 100?F or higher ? ?For urgent or emergent issues, a gastroenterologist can be reached at any hour by calling [phone]. ?Do not use MyChart messaging for urgent concerns.  ? ? ?DIET:  We do recommend a small meal at first, but then you may proceed to your  regular diet.  Drink plenty of fluids but you should avoid alcoholic beverages for 24 hours. ? ?ACTIVITY:  You should plan to take it easy for the rest of today and you should NOT DRIVE or use heavy machinery until tomorrow (because of the sedation medicines used during the test).   ? ?FOLLOW UP: ?Our staff will call the number listed on your records 48-72 hours following your procedure to check on you and address any questions or concerns that you may have regarding the information given to you following your procedure. If we do not reach you, we will leave a message.  We will attempt to reach you two times.  During this call, we will ask if you have developed any symptoms of COVID 19. If you develop any symptoms (ie: fever, flu-like symptoms, shortness of breath, cough etc.) before then, please call [phone].  If you test positive for Covid 19 in the 2 weeks post procedure, please call and report this information to [place].   ? ?If any biopsies were taken you will be contacted by phone or by letter within the next 1-3 weeks.  Please call us at [phone] if you have not heard about the biopsies in 3 weeks.  ? ? ?SIGNATURES/CONFIDENTIALITY: ?You and/or your care partner have signed paperwork which will be entered into your electronic medical record.  These signatures attest to the fact that that the information above on your After Visit Summary has been reviewed and is understood.  Full responsibility of the confidentiality of this discharge information lies with you and/or your care-partner.  ?

## 2022-01-30 NOTE — Telephone Encounter (Signed)
-----   Message from Lavena Bullion, DO sent at 01/30/2022 11:44 AM EDT ----- ?Can you please schedule OV with me for hemorrhoid banding? Thanks.  ? ?

## 2022-01-31 NOTE — Telephone Encounter (Signed)
Called the patient and LVM to schedule OV for hemorrhoid banding. ?

## 2022-02-01 ENCOUNTER — Telehealth: Payer: Self-pay

## 2022-02-01 NOTE — Telephone Encounter (Signed)
First post procedure follow up call, no answer 

## 2022-02-01 NOTE — Telephone Encounter (Signed)
?  Follow up Call- ? ?Call back number 01/30/2022 10/29/2019  ?Post procedure Call Back phone  # [phone]  ?Permission to leave phone message Yes Yes  ?Some recent data might be hidden  ? ?2nd f/u call attempted, no answer, left VM ? ?

## 2022-02-05 ENCOUNTER — Other Ambulatory Visit: Payer: Self-pay

## 2022-02-05 ENCOUNTER — Ambulatory Visit (HOSPITAL_COMMUNITY)
Admission: RE | Admit: 2022-02-05 | Discharge: 2022-02-05 | Disposition: A | Discharge status: Home / Self Care | Payer: Medicare Other | Source: Ambulatory Visit | Attending: Cardiovascular Disease | Admitting: Cardiovascular Disease

## 2022-02-05 DIAGNOSIS — F17200 Nicotine dependence, unspecified, uncomplicated: Secondary | ICD-10-CM | POA: Insufficient documentation

## 2022-02-05 DIAGNOSIS — Z136 Encounter for screening for cardiovascular disorders: Secondary | ICD-10-CM | POA: Diagnosis present

## 2022-02-05 DIAGNOSIS — I10 Essential (primary) hypertension: Secondary | ICD-10-CM | POA: Diagnosis not present

## 2022-02-08 ENCOUNTER — Encounter: Payer: Self-pay | Admitting: Gastroenterology

## 2022-02-08 ENCOUNTER — Ambulatory Visit (INDEPENDENT_AMBULATORY_CARE_PROVIDER_SITE_OTHER): Payer: Medicare Other | Admitting: Gastroenterology

## 2022-02-08 ENCOUNTER — Other Ambulatory Visit: Payer: Self-pay

## 2022-02-08 VITALS — BP 110/80 | HR 59 | Ht 72.0 in | Wt 192.0 lb

## 2022-02-08 DIAGNOSIS — K641 Second degree hemorrhoids: Secondary | ICD-10-CM | POA: Diagnosis not present

## 2022-02-08 DIAGNOSIS — Z86010 Personal history of colonic polyps: Secondary | ICD-10-CM
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
## 2022-02-08 NOTE — Patient Instructions (Addendum)
If you are age 57 or older, your body mass index should be between 23-30. Your Body mass index is 26.04 kg/m?Marland Kitchen If this is out of the aforementioned range listed, please consider follow up with your Primary Care Provider. ? ?If you are age 47 or younger, your body mass index should be between 19-25. Your Body mass index is 26.04 kg/m?Marland Kitchen If this is out of the aformentioned range listed, please consider follow up with your Primary Care Provider.  ? ?________________________________________________________ ? ?The [facility] providers would like to encourage you to use [facility] to communicate with providers for non-urgent requests or questions.  Due to long hold times on the telephone, sending your provider a message by [facility] may be a faster and more efficient way to get a response.  Please allow 48 business hours for a response.  Please remember that this is for non-urgent requests.  ?_______________________________________________________ ? ?HEMORRHOID BANDING PROCEDURE  ? ? FOLLOW-UP CARE ? ? ?The procedure you have had should have been relatively painless since the banding of the area involved does not have nerve endings and there is no pain sensation.  The rubber band cuts off the blood supply to the hemorrhoid and the band may fall off as soon as 48 hours after the banding (the band may occasionally be seen in the toilet bowl following a bowel movement). You may notice a temporary feeling of fullness in the rectum which should respond adequately to plain Tylenol? or Motrin?. ? ?Following the banding, avoid strenuous exercise that evening and resume full activity the next day.  A sitz bath (soaking in a warm tub) or bidet is soothing, and can be useful for cleansing the area after bowel movements.   ? ? ?To avoid constipation, take two tablespoons of natural wheat bran, natural oat bran, flax, Benefiber? or any over the counter fiber supplement and increase your water intake to 7-8 glasses daily.   ? ?Unless you  have been prescribed anorectal medication, do not put anything inside your rectum for two weeks: No suppositories, enemas, fingers, etc. ? ?Occasionally, you may have more bleeding than usual after the banding procedure.  This is often from the untreated hemorrhoids rather than the treated one.  Don?t be concerned if there is a tablespoon or so of blood.  If there is more blood than this, lie flat with your bottom higher than your head and apply an ice pack to the area. If the bleeding does not stop within a half an hour or if you feel faint, call our office at [phone] or go to the emergency room. ? ?Problems are not common; however, if there is a substantial amount of bleeding, severe pain, chills, fever or difficulty passing urine (very rare) or other problems, you should call us at [phone] or report to the nearest emergency room. ? ?Do not stay seated continuously for more than 2-3 hours for a day or two after the procedure.  Tighten your buttock muscles 10-15 times every two hours and take 10-15 deep breaths every 1-2 hours.  Do not spend more than a few minutes on the toilet if you cannot empty your bowel; instead re-visit the toilet at a later time. ? ?Next appointment is 03-14-2022 at 11:20am ? ? ?It was a pleasure to see you today! ? ?Gerrit Heck, D.O. ? ? ? ? ? ?We want to thank you for trusting [facility] with your care. All of our staff and providers value the relationships  we have built with our patients, and it is an honor to care for you.  ? ?We are writing to let you know that [facility] will close on Apr 01, 2022, and we invite you to continue to see Dr. Carmell Austria and Gerrit Heck at the [facility] [place] office location. We are consolidating our serices at these [facility] practices to better provide care. Our office staff will work with you to ensure a seamless transition.  ? ?Gerrit Heck, DO -Dr. Bryan Lemma  will be movig to [facility] at [address] [place], [place] [address], effective Apr 01, 2022.  Contact [phone] to schedule an appointment with him.  ? ?Carmell Austria, MD- Dr. Lyndel Safe will be movig to [facility] at [address], [place], [place] [address], effective Apr 01, 2022.  Contact [phone] to schedule an appointment with him.  ? ?Requesting Medical Records ?If you need to request your medical records, please follow the instructions below. Your medical records are confidential, and a copy can be transferred to another provider or released to you or another person you designate only with your permission. ? ?There are several ways to request your medical records: ?Requests for medical records can be submitted through our practice.   ?You can also request your records electronically, in your MyChart account by selecting the ?Request Health Records? tab.  ?If you need additional information on how to request records, please go to [email or web address]/, choose Patient Information, then select Request Medical Records. ?To make an appointment or if you have any questions about your health care needs, please contact our office at [phone] and one of our staff members will be glad to assist you. ?[facility] is committed to providing exceptional care for you and our community. Thank you for allowing [place] to serve your health care needs. ?Sincerely, ? ?Windy Canny, Director [facility] ?[facility] also offers convenient virtual care options. Sore throat? Sinus problems? Cold or flu symptoms? Get care from the comfort of home with [facility] Video Visits and e-Visits. Learn more about the non-emergency conditions treated and start your virtual visit at [email or web address] ? ?

## 2022-02-14 ENCOUNTER — Other Ambulatory Visit: Payer: Self-pay | Admitting: Medical

## 2022-02-15 DIAGNOSIS — E785 Hyperlipidemia, unspecified: Secondary | ICD-10-CM

## 2022-02-15 DIAGNOSIS — I10 Essential (primary) hypertension: Secondary | ICD-10-CM | POA: Diagnosis not present

## 2022-02-23 ENCOUNTER — Other Ambulatory Visit: Payer: Self-pay | Admitting: Medical

## 2022-02-25 ENCOUNTER — Telehealth: Payer: Self-pay

## 2022-02-25 MED ORDER — TADALAFIL 20 MG PO TABS: ORAL_TABLET | ORAL | 0 refills | Status: DC

## 2022-02-25 NOTE — Telephone Encounter (Signed)
Pt advised.

## 2022-02-25 NOTE — Telephone Encounter (Signed)
Rx sent 

## 2022-02-25 NOTE — Telephone Encounter (Signed)
Caller Name Graves Nipp ?Caller Phone Number [phone] ?Patient Name Cory Benson ?Patient DOB 30-Jul-1966 ?Call Type Message Only Information Provided ?Reason for Call Request for General Office Information ?Initial Comment Caller states he is just wanting to let his doctors office know that his pharmacy is sending over ?a request for a refill on his Cy[facility]. ?Additional Comment Pt is just wanting to let the office know that his pharmacy is sending over a request for a refill. ?Disp. Time Disposition Final User ?02/23/2022 5:56:52 PM General Information Provided Yes Jaynie Crumble ?Call Closed By: Jaynie Crumble ?Transaction Date/Time: 02/23/2022 5:54:07 PM (ET) ?

## 2022-03-08 ENCOUNTER — Ambulatory Visit: Payer: Medicare Other | Admitting: Medical

## 2022-03-12 ENCOUNTER — Other Ambulatory Visit: Payer: Self-pay | Admitting: Medical

## 2022-03-14 ENCOUNTER — Ambulatory Visit (HOSPITAL_BASED_OUTPATIENT_CLINIC_OR_DEPARTMENT_OTHER)
Admission: RE | Admit: 2022-03-14 | Discharge: 2022-03-14 | Disposition: A | Discharge status: Home / Self Care | Payer: Medicare Other | Source: Ambulatory Visit | Attending: Medical | Admitting: Medical

## 2022-03-14 ENCOUNTER — Ambulatory Visit (INDEPENDENT_AMBULATORY_CARE_PROVIDER_SITE_OTHER): Payer: Medicare Other | Admitting: Gastroenterology

## 2022-03-14 ENCOUNTER — Encounter: Payer: Self-pay | Admitting: Gastroenterology

## 2022-03-14 ENCOUNTER — Ambulatory Visit (INDEPENDENT_AMBULATORY_CARE_PROVIDER_SITE_OTHER): Payer: Medicare Other | Admitting: Medical

## 2022-03-14 VITALS — BP 138/80 | HR 88 | Resp 18 | Ht 72.0 in | Wt 190.0 lb

## 2022-03-14 DIAGNOSIS — M25562 Pain in left knee: Secondary | ICD-10-CM | POA: Diagnosis present

## 2022-03-14 DIAGNOSIS — K641 Second degree hemorrhoids: Secondary | ICD-10-CM | POA: Diagnosis not present

## 2022-03-14 DIAGNOSIS — E785 Hyperlipidemia, unspecified: Secondary | ICD-10-CM | POA: Diagnosis not present

## 2022-03-14 DIAGNOSIS — I10 Essential (primary) hypertension: Secondary | ICD-10-CM

## 2022-03-14 DIAGNOSIS — M25552 Pain in left hip: Secondary | ICD-10-CM

## 2022-03-14 MED ORDER — METHYLPREDNISOLONE 4 MG PO TABS
ORAL_TABLET | ORAL | 0 refills | Status: DC
Start: 2022-03-14 — End: 2022-04-10

## 2022-03-14 MED ORDER — TADALAFIL 20 MG PO TABS: ORAL_TABLET | ORAL | 0 refills | Status: DC

## 2022-03-14 NOTE — Progress Notes (Addendum)
? ?  Subjective:  ? ? Patient ID: Cory Benson, male    DOB: 24-Mar-1966, 56 y.o.   MRN: [id number] ? ?HPI ? ?Pt states recently has left knee pain medial aspect for 2 weeks. He states notes pain more when it rains. He states had 2 arthroscopic procedures to left knee in past. Pt states he also has history of left hip pain.  ? ?March 17, 23 he had injection to left hip. ? ?Assessment/Plan:  ? ?1. Primary osteoarthritis of left hip [place] Injection Block Major Joint  ? ?2. Left hip pain  ? ? ?Patient presents with signs, symptoms, and imaging consistent with degenerative arthritis of the hip. Discussed diagnosis and treatment options with the patient including observation, physical therapy, OTC medications, steroid injections, and joint replacement surgery. Pt elected to pursue another injection today. This was completed after discussion of risks and benefits. He will return PRN. ? ?Pt is on celebrex 100 mg twice daily.  ? ?Pain level in knee about 7/10. ? ? ?Hyperlipidemia- pt on atorvastatin. ? ?Htn- bp relatively well controlled. On losartan 25 mg daily. ? ?Jerrye Bushy- controlled with pepcid. ? ?Pt still smoking. Advised decreased smoking will reduce cardiovascular risk. ? ?The 10-year ASCVD risk score (Arnett DK, et al., 2019) is: 20.2% ?  Values used to calculate the score: ?    Age: 56 years ?    Sex: Male ?    Is Non-Hispanic African American: Yes ?    Diabetic: No ?    Tobacco smoker: Yes ?    Systolic Blood Pressure: 333 mmHg ?    Is BP treated: Yes ?    HDL Cholesterol: 46.5 mg/dL ?    Total Cholesterol: 146 mg/dL  ? ?Review of Systems  ?Constitutional:  Negative for chills, fatigue and fever.  ?Respiratory:  Negative for cough, chest tightness, shortness of breath and wheezing.   ?Cardiovascular:  Negative for chest pain and palpitations.  ?Gastrointestinal:  Negative for abdominal pain.  ?Musculoskeletal:  Negative for back pain and neck pain.  ?     Left hip and left knee pain  ?Skin:  Negative for rash.  ? ?    ?Objective:  ? Physical Exam ? ?General- No acute distress. Pleasant patient. ?Neck- Full range of motion, no jvd ?Lungs- Clear, even and unlabored. ?Heart- regular rate and rhythm. ?Neurologic- CNII- XII grossly intact.  ?Left hip- pain on rom. ?Left knee- moderate pain on flexion and extension. Tender medial aspect. No obvious swelling. No warmth. On rom moderate crepitus. ? ? ?   ?Assessment & Plan:  ? ?Patient Instructions  ?For left knee pain will get xray today. After review likely wil  give taper course of steroid. ? ?Left hip pain continue to follow up with your orthopedist. ? ?Htn controlled. Continue losartan. ? ?For hyperlipidemia continue atorvastatin. ? ?Jerrye Bushy- controlled with pepcid. ? ?Smoking cessation encourage/advised. ? ?For ED refiled cialis. ? ?Follow up date to be determined after xray review.  ? ?Mackie Pai, [name]  ?

## 2022-03-14 NOTE — Addendum Note (Signed)
Addended by: Anabel Halon on: 03/14/2022 12:20 PM ? ? Modules accepted: Orders ? ?

## 2022-03-14 NOTE — Patient Instructions (Addendum)
For left knee pain will get xray today. After review likely wil  give taper course of steroid. ? ?Left hip pain continue to follow up with your orthopedist. ? ?Htn controlled. Continue losartan. ? ?For hyperlipidemia continue atorvastatin. ? ?Jerrye Bushy- controlled with pepcid. ? ?Smoking cessation encourage/advised. ? ?For ED refiled cialis. ? ?Follow up date to be determined after xray review. ?

## 2022-03-14 NOTE — Patient Instructions (Addendum)
If you are age 57 or older, your body mass index should be between 23-30. Your Body mass index is 25.79 kg/m?Marland Kitchen If this is out of the aforementioned range listed, please consider follow up with your Primary Care Provider. ? ?If you are age 66 or younger, your body mass index should be between 19-25. Your Body mass index is 25.79 kg/m?Marland Kitchen If this is out of the aformentioned range listed, please consider follow up with your Primary Care Provider.   ? ?Due to recent changes in healthcare laws, you may see the results of your imaging and laboratory studies on MyChart before your provider has had a chance to review them.  We understand that in some cases there may be results that are confusing or concerning to you. Not all laboratory results come back in the same time frame and the provider may be waiting for multiple results in order to interpret others.  Please give [place] 48 hours in order for your provider to thoroughly review all the results before contacting the office for clarification of your results.  ? ?You have been scheduled for an appointment with Dr. Bryan Lemma on 04/10/22 at 11:20 am. Please arrive 10 minutes early for your appointment.  ? ?HEMORRHOID BANDING PROCEDURE  ? ? FOLLOW-UP CARE ? ? ?The procedure you have had should have been relatively painless since the banding of the area involved does not have nerve endings and there is no pain sensation.  The rubber band cuts off the blood supply to the hemorrhoid and the band may fall off as soon as 48 hours after the banding (the band may occasionally be seen in the toilet bowl following a bowel movement). You may notice a temporary feeling of fullness in the rectum which should respond adequately to plain Tylenol? or Motrin?. ? ?Following the banding, avoid strenuous exercise that evening and resume full activity the next day.  A sitz bath (soaking in a warm tub) or bidet is soothing, and can be useful for cleansing the area after bowel movements.   ? ? ?To  avoid constipation, take two tablespoons of natural wheat bran, natural oat bran, flax, Benefiber? or any over the counter fiber supplement and increase your water intake to 7-8 glasses daily.   ? ?Unless you have been prescribed anorectal medication, do not put anything inside your rectum for two weeks: No suppositories, enemas, fingers, etc. ? ?Occasionally, you may have more bleeding than usual after the banding procedure.  This is often from the untreated hemorrhoids rather than the treated one.  Don?t be concerned if there is a tablespoon or so of blood.  If there is more blood than this, lie flat with your bottom higher than your head and apply an ice pack to the area. If the bleeding does not stop within a half an hour or if you feel faint, call our office at [phone] or go to the emergency room. ? ?Problems are not common; however, if there is a substantial amount of bleeding, severe pain, chills, fever or difficulty passing urine (very rare) or other problems, you should call us at [phone] or report to the nearest emergency room. ? ?Do not stay seated continuously for more than 2-3 hours for a day or two after the procedure.  Tighten your buttock muscles 10-15 times every two hours and take 10-15 deep breaths every 1-2 hours.  Do not spend more than a few minutes on the toilet if you cannot empty your bowel; instead re-visit  the toilet at a later time. ? ?Thank you for choosing me and [facility]. ? ?Gerrit Heck, D.O.  ? ? ? ?We want to thank you for trusting [facility] with your care. All of our staff and providers value the relationships we have built with our patients, and it is an honor to care for you.  ? ?We are writing to let you know that [facility] will close on Apr 01, 2022, and we invite you to continue to see Dr. Carmell Austria and Gerrit Heck at the [facility] [place] office location. We are consolidating  our serices at these [facility] practices to better provide care. Our office staff will work with you to ensure a seamless transition.  ? ?Gerrit Heck, DO -Dr. Bryan Lemma will be movig to [facility] at [address], [place], [place] [address], effective Apr 01, 2022.  Contact [phone] to schedule an appointment with him.  ? ?Carmell Austria, MD- Dr. Lyndel Safe will be movig to [facility] at [address], [place], [place] [address], effective Apr 01, 2022.  Contact [phone] to schedule an appointment with him.  ? ?Requesting Medical Records ?If you need to request your medical records, please follow the instructions below. Your medical records are confidential, and a copy can be transferred to another provider or released to you or another person you designate only with your permission. ? ?There are several ways to request your medical records: ?Requests for medical records can be submitted through our practice.   ?You can also request your records electronically, in your MyChart account by selecting the ?Request Health Records? tab.  ?If you need additional information on how to request records, please go to [email or web address]/, choose Patient Information, then select Request Medical Records. ?To make an appointment or if you have any questions about your health care needs, please contact our office at [phone] and one of our staff members will be glad to assist you. ?[facility] is committed to providing exceptional care for you and our community. Thank you for allowing [place] to serve your health care needs. ?Sincerely, ? ?Windy Canny, Director [facility] ?[facility] also offers convenient virtual care options. Sore throat? Sinus problems? Cold or flu symptoms? Get care from the comfort of home with [facility] Video Visits and e-Visits. Learn more about the non-emergency conditions treated and start your virtual visit at [email or web address]  ? ? ?  ?
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
## 2022-03-15 ENCOUNTER — Telehealth: Payer: Self-pay | Admitting: *Deleted

## 2022-03-15 NOTE — Telephone Encounter (Signed)
Pt called and labs reviewed 

## 2022-03-15 NOTE — Telephone Encounter (Signed)
Who Is Calling Patient / Member / Family / Caregiver ?Caller Phone Number [phone] ?Patient Name Cory Benson ?Patient DOB 12-07-1965 ?Call Type Message Only Information Provided ?Reason for Call Request for Lab/Test Results ?Initial Comment Caller states he was supposed to hear back about his xray results and what to do next and never ?received a call. ?Additional Comment Office hours provided. ?Disp. Time Disposition Final User ?03/14/2022 5:31:02 PM General Information Provided Yes Guffey, Heather ?

## 2022-03-19 ENCOUNTER — Other Ambulatory Visit: Payer: Self-pay | Admitting: Medical

## 2022-03-24 ENCOUNTER — Other Ambulatory Visit: Payer: Self-pay | Admitting: Medical

## 2022-03-29 ENCOUNTER — Ambulatory Visit (HOSPITAL_BASED_OUTPATIENT_CLINIC_OR_DEPARTMENT_OTHER)
Admission: RE | Admit: 2022-03-29 | Discharge: 2022-03-29 | Disposition: A | Discharge status: Home / Self Care | Payer: Medicare Other | Source: Ambulatory Visit | Attending: Medical | Admitting: Medical

## 2022-03-29 ENCOUNTER — Ambulatory Visit: Payer: Medicare Other | Admitting: Medical

## 2022-03-29 ENCOUNTER — Ambulatory Visit (INDEPENDENT_AMBULATORY_CARE_PROVIDER_SITE_OTHER): Payer: Medicare Other | Admitting: Medical

## 2022-03-29 VITALS — BP 129/82 | HR 79 | Resp 18 | Ht 72.0 in | Wt 192.4 lb

## 2022-03-29 DIAGNOSIS — R0781 Pleurodynia: Secondary | ICD-10-CM

## 2022-03-29 DIAGNOSIS — M25552 Pain in left hip: Secondary | ICD-10-CM

## 2022-03-29 DIAGNOSIS — I10 Essential (primary) hypertension: Secondary | ICD-10-CM

## 2022-03-29 DIAGNOSIS — F17200 Nicotine dependence, unspecified, uncomplicated: Secondary | ICD-10-CM

## 2022-03-29 DIAGNOSIS — K219 Gastro-esophageal reflux disease without esophagitis: Secondary | ICD-10-CM

## 2022-03-29 DIAGNOSIS — M25562 Pain in left knee: Secondary | ICD-10-CM

## 2022-03-29 NOTE — Addendum Note (Signed)
Addended by: Anabel Halon on: 03/29/2022 03:11 PM ? ? Modules accepted: Orders, Level of Service ? ?
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
## 2022-03-29 NOTE — Patient Instructions (Addendum)
Left knee pain with mild degenerative changes. Improved with medrol. Now can use celebrex. If pain returns can refer to Dr. Raeford Razor. If in future you see sport med for knee pain see if they can manage left hip as well. ? ? ?Left hip pain continue with current ortho.  ? ?For hyperlipidemia continue atorvastatin. ?  ?Jerrye Bushy- controlled with pepcid. ?  ?Smoking cessation encourage/advised. ? ?For left posterior rib area pain decided to put in left side rib series. ? ?Follow up in 3 months or sooner if needed. ?

## 2022-04-10 ENCOUNTER — Telehealth: Payer: Self-pay | Admitting: Medical

## 2022-04-10 ENCOUNTER — Encounter: Payer: Self-pay | Admitting: Gastroenterology

## 2022-04-10 ENCOUNTER — Ambulatory Visit (INDEPENDENT_AMBULATORY_CARE_PROVIDER_SITE_OTHER): Payer: Medicare Other | Admitting: Gastroenterology

## 2022-04-10 VITALS — BP 128/86 | HR 79 | Ht 72.0 in | Wt 189.1 lb

## 2022-04-10 DIAGNOSIS — K641 Second degree hemorrhoids: Secondary | ICD-10-CM

## 2022-04-10 NOTE — Telephone Encounter (Signed)
Pt called and lvm to return call 

## 2022-04-10 NOTE — Patient Instructions (Addendum)
If you are age 57 or older, your body mass index should be between 23-30. Your Body mass index is 25.65 kg/m. If this is out of the aforementioned range listed, please consider follow up with your Primary Care Provider.  If you are age 50 or younger, your body mass index should be between 19-25. Your Body mass index is 25.65 kg/m. If this is out of the aformentioned range listed, please consider follow up with your Primary Care Provider.   ________________________________________________________  The [facility] providers would like to encourage you to use [facility] to communicate with providers for non-urgent requests or questions.  Due to long hold times on the telephone, sending your provider a message by [facility] may be a faster and more efficient way to get a response.  Please allow 48 business hours for a response.  Please remember that this is for non-urgent requests.  _______________________________________________________  Please purchase the following medications over the counter and take as directed: Benefiber or Citrucel  HEMORRHOID BANDING PROCEDURE    FOLLOW-UP CARE   The procedure you have had should have been relatively painless since the banding of the area involved does not have nerve endings and there is no pain sensation.  The rubber band cuts off the blood supply to the hemorrhoid and the band may fall off as soon as 48 hours after the banding (the band may occasionally be seen in the toilet bowl following a bowel movement). You may notice a temporary feeling of fullness in the rectum which should respond adequately to plain Tylenol or Motrin.  Following the banding, avoid strenuous exercise that evening and resume full activity the next day.  A sitz bath (soaking in a warm tub) or bidet is soothing, and can be useful for cleansing the area after bowel movements.     To avoid constipation, take two tablespoons of natural wheat bran, natural oat bran, flax, Benefiber or any  over the counter fiber supplement and increase your water intake to 7-8 glasses daily.    Unless you have been prescribed anorectal medication, do not put anything inside your rectum for two weeks: No suppositories, enemas, fingers, etc.  Occasionally, you may have more bleeding than usual after the banding procedure.  This is often from the untreated hemorrhoids rather than the treated one.  Don't be concerned if there is a tablespoon or so of blood.  If there is more blood than this, lie flat with your bottom higher than your head and apply an ice pack to the area. If the bleeding does not stop within a half an hour or if you feel faint, call our office at [phone] or go to the emergency room.  Problems are not common; however, if there is a substantial amount of bleeding, severe pain, chills, fever or difficulty passing urine (very rare) or other problems, you should call us at [phone] or report to the nearest emergency room.  Do not stay seated continuously for more than 2-3 hours for a day or two after the procedure.  Tighten your buttock muscles 10-15 times every two hours and take 10-15 deep breaths every 1-2 hours.  Do not spend more than a few minutes on the toilet if you cannot empty your bowel; instead re-visit the toilet at a later time.    Thank you for choosing me and [facility].  Vito Cirigliano, D.O.

## 2022-04-10 NOTE — Telephone Encounter (Signed)
Patient states his left knee and hip is still bothering him and he would like to be referred across the hall for physical therapy. Please advise.
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
## 2022-04-10 NOTE — Addendum Note (Signed)
Addended by: Anabel Halon on: 04/10/2022 12:36 PM   Modules accepted: Orders

## 2022-04-10 NOTE — Telephone Encounter (Signed)
Pt called back and notified.

## 2022-04-24 ENCOUNTER — Ambulatory Visit (INDEPENDENT_AMBULATORY_CARE_PROVIDER_SITE_OTHER): Payer: Medicare Other | Admitting: Family Medicine

## 2022-04-24 ENCOUNTER — Ambulatory Visit: Payer: Self-pay

## 2022-04-24 ENCOUNTER — Encounter: Payer: Self-pay | Admitting: Family Medicine

## 2022-04-24 VITALS — BP 130/102 | Ht 72.0 in | Wt 189.0 lb

## 2022-04-24 DIAGNOSIS — M1612 Unilateral primary osteoarthritis, left hip: Secondary | ICD-10-CM | POA: Insufficient documentation

## 2022-04-24 DIAGNOSIS — M23204 Derangement of unspecified medial meniscus due to old tear or injury, left knee: Secondary | ICD-10-CM

## 2022-04-24 MED ORDER — TRIAMCINOLONE ACETONIDE 40 MG/ML IJ SUSP
40.0000 mg | Freq: Once | INTRAMUSCULAR | Status: AC
  Administered 2022-04-24: 40 mg via INTRA_ARTICULAR

## 2022-04-24 NOTE — Addendum Note (Signed)
Addended by: Cresenciano Lick on: 04/24/2022 03:43 PM   Modules accepted: Orders

## 2022-04-24 NOTE — Patient Instructions (Signed)
Good to see you Please use ice as needed  Please try the exercises   Please send me a message in MyChart with any questions or updates.  Please see me back in 4 weeks.   --Dr. Gilberta Peeters  

## 2022-04-24 NOTE — Assessment & Plan Note (Signed)
Acute on chronic in nature.  Likely exacerbated due to the worsening left hip pain. -Counseled on home exercise therapy and supportive care. -Could consider injection or physical therapy

## 2022-04-24 NOTE — Assessment & Plan Note (Signed)
Acute on chronic in nature.  Degenerative changes appreciated. -Counseled on home exercise therapy and supportive care. -Injection today. -Could consider prolotherapy or further imaging.
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
## 2022-05-16 ENCOUNTER — Telehealth: Payer: Self-pay

## 2022-05-16 MED ORDER — TADALAFIL 20 MG PO TABS: ORAL_TABLET | ORAL | 0 refills | Status: DC

## 2022-05-16 NOTE — Telephone Encounter (Signed)
Rx sent 

## 2022-05-16 NOTE — Telephone Encounter (Signed)
Nurse Assessment Nurse: Mikael Spray, RN, [place] Date/Time Eilene Ghazi Time): 05/15/2022 5:35:57 PM Confirm and document reason for call. If symptomatic, describe symptoms. ---Caller states he is completely out of his Cialis medication. No new or worsening sx. RN will fax report requesting follow up from provider office. Does the patient have any new or worsening symptoms? ---No Nurse: Mikael Spray, RN, Tay[place] Date/Time (Eastern Time): 05/15/2022 5:37:29 PM Please select the assessment type ---Refill Additional Documentation ---cialis Does the patient have enough medication to last until the office opens? ---No Disp. Time Eilene Ghazi Time) Disposition Final User 05/15/2022 5:15:29 PM Attempt made - message left Quick, RN, Tayler 05/15/2022 5:38:38 PM Clinical Call Yes Quick, RN, Tayler Final Disposition 05/15/2022 5:38:38 PM Clinical Call Yes Quick, RN, Tayler

## 2022-05-17 MED ORDER — TADALAFIL 20 MG PO TABS: ORAL_TABLET | ORAL | 0 refills | Status: DC

## 2022-05-17 NOTE — Telephone Encounter (Signed)
Rx sent to [facility]

## 2022-05-17 NOTE — Telephone Encounter (Signed)
Patient states the Cialis was supposed to be sent to [facility] not [facility]. Please advise.    [facility], [address] [place] [place], [place] [address]  Phone:  [phone]  Fax:  [phone]

## 2022-05-17 NOTE — Addendum Note (Signed)
Addended by: Jeronimo Greaves on: 05/17/2022 11:03 AM   Modules accepted: Orders

## 2022-05-22 ENCOUNTER — Ambulatory Visit (INDEPENDENT_AMBULATORY_CARE_PROVIDER_SITE_OTHER): Payer: Medicare Other | Admitting: Family Medicine

## 2022-05-22 ENCOUNTER — Telehealth: Payer: Self-pay | Admitting: Medical

## 2022-05-22 ENCOUNTER — Encounter: Payer: Self-pay | Admitting: Family Medicine

## 2022-05-22 DIAGNOSIS — M1712 Unilateral primary osteoarthritis, left knee: Secondary | ICD-10-CM | POA: Diagnosis not present
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
## 2022-05-22 NOTE — Assessment & Plan Note (Signed)
Acute on chronic in nature.  Likely having to compensate for his left hip.  Feels unstable at times.  He has tried steroid injections in the past.  He has a history of knee surgery. -Counseled on home exercise therapy and supportive care. -Pursue Zilretta injection.

## 2022-05-22 NOTE — Telephone Encounter (Signed)
Pt would like rx sent to a different pharmacy.   Medication:  losartan (COZAAR) 25 MG tablet  Has the patient contacted their pharmacy? No.  Preferred Pharmacy:  [facility], [address]   [place], [place] [address]  Phone:  [phone]  Fax:  [phone]

## 2022-05-23 MED ORDER — LOSARTAN POTASSIUM 25 MG PO TABS: 25.0000 mg | ORAL_TABLET | Freq: Every day | ORAL | 3 refills | Status: DC

## 2022-05-23 NOTE — Telephone Encounter (Signed)
Rx sent 

## 2022-05-28 ENCOUNTER — Telehealth: Payer: Self-pay | Admitting: Family Medicine

## 2022-05-28 NOTE — Telephone Encounter (Signed)
Submitted request for zilretta today.

## 2022-05-31 NOTE — Telephone Encounter (Signed)
[employer] summary of benefits shows Patient owes 20% of the cost of Zilretta, CPT 20610 and OV. The remaining 80% will be covered by plan. Patient has secondary that will pick up leftover from primary.   Patient informed. OV scheduled 06/04/22 @ 1:50.

## 2022-06-04 ENCOUNTER — Encounter: Payer: Self-pay | Admitting: Family Medicine

## 2022-06-04 ENCOUNTER — Ambulatory Visit (INDEPENDENT_AMBULATORY_CARE_PROVIDER_SITE_OTHER): Payer: Medicare Other | Admitting: Family Medicine

## 2022-06-04 ENCOUNTER — Ambulatory Visit: Payer: Self-pay

## 2022-06-04 VITALS — BP 122/92 | Ht 72.0 in | Wt 195.0 lb

## 2022-06-04 DIAGNOSIS — M1712 Unilateral primary osteoarthritis, left knee: Secondary | ICD-10-CM

## 2022-06-04 MED ORDER — TRIAMCINOLONE ACETONIDE 32 MG IX SRER
32.0000 mg | Freq: Once | INTRA_ARTICULAR | Status: AC
  Administered 2022-06-04: 32 mg via INTRA_ARTICULAR

## 2022-06-04 NOTE — Patient Instructions (Signed)
Good to see you ?Please use ice as needed  ?Please send me a message in MyChart with any questions or updates.  ?Please see me back in 3 months.  ? ?--Dr. Vansh Reckart ? ?

## 2022-06-04 NOTE — Assessment & Plan Note (Signed)
Completed zilretta injections.
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
## 2022-06-15 ENCOUNTER — Other Ambulatory Visit: Payer: Self-pay | Admitting: Medical

## 2022-07-03 ENCOUNTER — Ambulatory Visit (INDEPENDENT_AMBULATORY_CARE_PROVIDER_SITE_OTHER): Payer: Medicare Other | Admitting: Medical

## 2022-07-03 VITALS — BP 128/88 | HR 64 | Temp 98.0°F | Resp 18 | Ht 72.0 in | Wt 190.6 lb

## 2022-07-03 DIAGNOSIS — G8929 Other chronic pain: Secondary | ICD-10-CM

## 2022-07-03 DIAGNOSIS — M25562 Pain in left knee: Secondary | ICD-10-CM | POA: Diagnosis not present

## 2022-07-03 DIAGNOSIS — M25552 Pain in left hip: Secondary | ICD-10-CM | POA: Diagnosis not present

## 2022-07-03 DIAGNOSIS — M5441 Lumbago with sciatica, right side: Secondary | ICD-10-CM | POA: Diagnosis not present

## 2022-07-03 MED ORDER — CELECOXIB 100 MG PO CAPS: 100.0000 mg | ORAL_CAPSULE | Freq: Two times a day (BID) | ORAL | 2 refills | Status: DC

## 2022-07-03 MED ORDER — TADALAFIL 20 MG PO TABS: ORAL_TABLET | ORAL | 0 refills | Status: DC

## 2022-07-03 NOTE — Patient Instructions (Addendum)
For back pain, left hip and knee pain recommend increasing celebrex to 100 mg twice a day. Conitnue gabapentin and continue flexeril.   Back stretch exercises.   If pain persists despite increasing celebrex then refer back to sports med. Also if severe pain or worse radicular features then refer back to orthopedist.  Do recommend smoking cessation as this is also required for your upcoming dental implants.  For ED refilling cialis.  Follow up in 2 months or sooner if needed.  Back Exercises The following exercises strengthen the muscles that help to support the trunk (torso) and back. They also help to keep the lower back flexible. Doing these exercises can help to prevent or lessen existing low back pain. If you have back pain or discomfort, try doing these exercises 2-3 times each day or as told by your health care provider. As your pain improves, do them once each day, but increase the number of times that you repeat the steps for each exercise (do more repetitions). To prevent the recurrence of back pain, continue to do these exercises once each day or as told by your health care provider. Do exercises exactly as told by your health care provider and adjust them as directed. It is normal to feel mild stretching, pulling, tightness, or discomfort as you do these exercises, but you should stop right away if you feel sudden pain or your pain gets worse. Exercises Single knee to chest Repeat these steps 3-5 times for each leg: Lie on your back on a firm bed or the floor with your legs extended. Bring one knee to your chest. Your other leg should stay extended and in contact with the floor. Hold your knee in place by grabbing your knee or thigh with both hands and hold. Pull on your knee until you feel a gentle stretch in your lower back or buttocks. Hold the stretch for 10-30 seconds. Slowly release and straighten your leg.  Pelvic tilt Repeat these steps 5-10 times: Lie on your back  on a firm bed or the floor with your legs extended. Bend your knees so they are pointing toward the ceiling and your feet are flat on the floor. Tighten your lower abdominal muscles to press your lower back against the floor. This motion will tilt your pelvis so your tailbone points up toward the ceiling instead of pointing to your feet or the floor. With gentle tension and even breathing, hold this position for 5-10 seconds.  Cat-cow Repeat these steps until your lower back becomes more flexible: Get into a hands-and-knees position on a firm bed or the floor. Keep your hands under your shoulders, and keep your knees under your hips. You may place padding under your knees for comfort. Let your head hang down toward your chest. Contract your abdominal muscles and point your tailbone toward the floor so your lower back becomes rounded like the back of a cat. Hold this position for 5 seconds. Slowly lift your head, let your abdominal muscles relax, and point your tailbone up toward the ceiling so your back forms a sagging arch like the back of a cow. Hold this position for 5 seconds.  Press-ups Repeat these steps 5-10 times: Lie on your abdomen (face-down) on a firm bed or the floor. Place your palms near your head, about shoulder-width apart. Keeping your back as relaxed as possible and keeping your hips on the floor, slowly straighten your arms to raise the top half of your body and lift your shoulders.  Do not use your back muscles to raise your upper torso. You may adjust the placement of your hands to make yourself more comfortable. Hold this position for 5 seconds while you keep your back relaxed. Slowly return to lying flat on the floor.  Bridges Repeat these steps 10 times: Lie on your back on a firm bed or the floor. Bend your knees so they are pointing toward the ceiling and your feet are flat on the floor. Your arms should be flat at your sides, next to your body. Tighten your  buttocks muscles and lift your buttocks off the floor until your waist is at almost the same height as your knees. You should feel the muscles working in your buttocks and the back of your thighs. If you do not feel these muscles, slide your feet 1-2 inches (2.5-5 cm) farther away from your buttocks. Hold this position for 3-5 seconds. Slowly lower your hips to the starting position, and allow your buttocks muscles to relax completely. If this exercise is too easy, try doing it with your arms crossed over your chest. Abdominal crunches Repeat these steps 5-10 times: Lie on your back on a firm bed or the floor with your legs extended. Bend your knees so they are pointing toward the ceiling and your feet are flat on the floor. Cross your arms over your chest. Tip your chin slightly toward your chest without bending your neck. Tighten your abdominal muscles and slowly raise your torso high enough to lift your shoulder blades a tiny bit off the floor. Avoid raising your torso higher than that because it can put too much stress on your lower back and does not help to strengthen your abdominal muscles. Slowly return to your starting position.  Back lifts Repeat these steps 5-10 times: Lie on your abdomen (face-down) with your arms at your sides, and rest your forehead on the floor. Tighten the muscles in your legs and your buttocks. Slowly lift your chest off the floor while you keep your hips pressed to the floor. Keep the back of your head in line with the curve in your back. Your eyes should be looking at the floor. Hold this position for 3-5 seconds. Slowly return to your starting position.  Contact a health care provider if: Your back pain or discomfort gets much worse when you do an exercise. Your worsening back pain or discomfort does not lessen within 2 hours after you exercise. If you have any of these problems, stop doing these exercises right away. Do not do them again unless your health  care provider says that you can. Get help right away if: You develop sudden, severe back pain. If this happens, stop doing the exercises right away. Do not do them again unless your health care provider says that you can. This information is not intended to replace advice given to you by your health care provider. Make sure you discuss any questions you have with your health care provider. Document Revised: 05/01/2021 Document Reviewed: 01/17/2021 Elsevier Patient Education  [place].
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
## 2022-07-03 NOTE — Addendum Note (Signed)
Addended by: Anabel Halon on: 07/03/2022 02:21 PM   Modules accepted: Orders

## 2022-07-05 ENCOUNTER — Ambulatory Visit: Payer: Medicare Other | Admitting: Medical

## 2022-07-24 ENCOUNTER — Ambulatory Visit: Payer: Medicare Other | Admitting: Family Medicine

## 2022-07-30 ENCOUNTER — Encounter: Payer: Self-pay | Admitting: Family Medicine

## 2022-07-30 ENCOUNTER — Ambulatory Visit (INDEPENDENT_AMBULATORY_CARE_PROVIDER_SITE_OTHER): Payer: Medicare Other | Admitting: Family Medicine

## 2022-07-30 ENCOUNTER — Ambulatory Visit: Payer: Self-pay

## 2022-07-30 VITALS — BP 120/90 | Ht 72.0 in | Wt 190.0 lb

## 2022-07-30 DIAGNOSIS — M1712 Unilateral primary osteoarthritis, left knee: Secondary | ICD-10-CM

## 2022-07-30 DIAGNOSIS — M1612 Unilateral primary osteoarthritis, left hip: Secondary | ICD-10-CM

## 2022-07-30 MED ORDER — TRIAMCINOLONE ACETONIDE 40 MG/ML IJ SUSP
40.0000 mg | Freq: Once | INTRAMUSCULAR | Status: AC
  Administered 2022-07-30: 40 mg via INTRA_ARTICULAR

## 2022-07-30 NOTE — Assessment & Plan Note (Signed)
Acutely worsening.  Having significant pain behind the kneecap as well as in the medial compartment.  We have tried greater than 6 weeks of physician directed home exercise therapy.  X-rays have been unrevealing. -Counseled on home exercise therapy and supportive care. -MRI of the left knee to evaluate for chondral lesion and for meniscal tear and for presurgical planning.

## 2022-07-30 NOTE — Assessment & Plan Note (Signed)
Acute on chronic in nature.  Has done well with the previous injection. -Counseled on home exercise therapy and supportive care. -Injection today.
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
## 2022-07-30 NOTE — Patient Instructions (Signed)
Good to see you Please try ice  We will get the MRI of the knee at [facility] imaging. Please send me a message in MyChart with any questions or updates.  We will set up a virtual visit once the MRI is resulted..   --Dr. Raeford Razor

## 2022-08-04 ENCOUNTER — Ambulatory Visit
Admission: RE | Admit: 2022-08-04 | Discharge: 2022-08-04 | Disposition: A | Discharge status: Home / Self Care | Payer: Medicare Other | Source: Ambulatory Visit | Attending: Family Medicine | Admitting: Family Medicine

## 2022-08-04 DIAGNOSIS — M1712 Unilateral primary osteoarthritis, left knee: Secondary | ICD-10-CM

## 2022-08-07 ENCOUNTER — Encounter: Payer: Self-pay | Admitting: Family Medicine

## 2022-08-07 ENCOUNTER — Telehealth (INDEPENDENT_AMBULATORY_CARE_PROVIDER_SITE_OTHER): Payer: Medicare Other | Admitting: Family Medicine

## 2022-08-07 DIAGNOSIS — M1712 Unilateral primary osteoarthritis, left knee: Secondary | ICD-10-CM

## 2022-08-07 NOTE — Progress Notes (Signed)
Virtual Visit via Video Note  I connected with Cory Benson on 08/07/22 at  8:00 AM EDT by a video enabled telemedicine application and verified that I am speaking with the correct person using two identifiers.  Location: Patient: home Provider: office   I discussed the limitations of evaluation and management by telemedicine and the availability of in person appointments. The patient expressed understanding and agreed to proceed.  History of Present Illness:  Cory Benson is a 56 year old male that is following up after the MRI of his left knee.  This is showing full-thickness cartilage loss and subchondral cystic change in the patellofemoral joint as well as extensive loss and subchondral marrow edema within the medial compartment   Observations/Objective:   Assessment and Plan:  Osteoarthritis of left knee: MRI is showing significant changes in the medial compartment as well as the patellofemoral joint.  Has a history of knee surgery from years ago.  Continues to have pain. -Counseled on home exercise therapy and supportive care. -Proceed gel injection. -Can try bracing and physical therapy.  Follow Up Instructions:    I discussed the assessment and treatment plan with the patient. The patient was provided an opportunity to ask questions and all were answered. The patient agreed with the plan and demonstrated an understanding of the instructions.   The patient was advised to call back or seek an in-person evaluation if the symptoms worsen or if the condition fails to improve as anticipated.    Clearance Coots, MD

## 2022-08-07 NOTE — Assessment & Plan Note (Signed)
MRI is showing significant changes in the medial compartment as well as the patellofemoral joint.  Has a history of knee surgery from years ago.  Continues to have pain. -Counseled on home exercise therapy and supportive care. -Proceed gel injection. -Can try bracing and physical therapy.

## 2022-08-08 ENCOUNTER — Telehealth: Payer: Self-pay | Admitting: Family Medicine

## 2022-08-08 NOTE — Telephone Encounter (Signed)
Submitted request to [facility] for gelsyn today. Uploaded last office note and imaging.

## 2022-08-12 NOTE — Telephone Encounter (Signed)
Pt informed of below.   Received fax from [facility] [employer] and procedure is covered at 80%. No precert is required. If OOP is met, coverage goes to 100%. Patient has Medicaid as secondary.   OV scheduled 08/21/22. Gelsyn 3 ordered with rep.

## 2022-08-21 ENCOUNTER — Other Ambulatory Visit: Payer: Self-pay | Admitting: Medical

## 2022-08-21 ENCOUNTER — Encounter: Payer: Self-pay | Admitting: Family Medicine

## 2022-08-21 ENCOUNTER — Ambulatory Visit (INDEPENDENT_AMBULATORY_CARE_PROVIDER_SITE_OTHER): Payer: Medicare Other | Admitting: Family Medicine

## 2022-08-21 ENCOUNTER — Ambulatory Visit: Payer: Self-pay

## 2022-08-21 VITALS — BP 127/81 | Ht 72.0 in | Wt 190.0 lb

## 2022-08-21 DIAGNOSIS — M1712 Unilateral primary osteoarthritis, left knee: Secondary | ICD-10-CM

## 2022-08-21 MED ORDER — SODIUM HYALURONATE (VISCOSUP) 16.8 MG/2ML IX SOSY
16.8000 mg | PREFILLED_SYRINGE | Freq: Once | INTRA_ARTICULAR | Status: AC
  Administered 2022-08-21: 16.8 mg via INTRA_ARTICULAR

## 2022-08-21 NOTE — Assessment & Plan Note (Addendum)
Completed gelsyn injection 1/3

## 2022-08-21 NOTE — Patient Instructions (Signed)
Good to see you  Please use ice as needed  Please send me a message in MyChart with any questions or updates.  Please see me back in 4 weeks.   --Dr. Rondalyn Belford  

## 2022-08-21 NOTE — Addendum Note (Signed)
Addended by: Cresenciano Lick on: 08/21/2022 03:21 PM   Modules accepted: Orders
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
## 2022-08-28 ENCOUNTER — Ambulatory Visit: Payer: Medicare Other | Admitting: Family Medicine

## 2022-08-29 ENCOUNTER — Encounter: Payer: Self-pay | Admitting: Family Medicine

## 2022-08-29 ENCOUNTER — Ambulatory Visit (INDEPENDENT_AMBULATORY_CARE_PROVIDER_SITE_OTHER): Payer: Medicare Other | Admitting: Family Medicine

## 2022-08-29 ENCOUNTER — Ambulatory Visit: Payer: Self-pay

## 2022-08-29 VITALS — BP 118/64 | Ht 72.0 in | Wt 190.0 lb

## 2022-08-29 DIAGNOSIS — M47812 Spondylosis without myelopathy or radiculopathy, cervical region: Secondary | ICD-10-CM

## 2022-08-29 DIAGNOSIS — M1712 Unilateral primary osteoarthritis, left knee: Secondary | ICD-10-CM

## 2022-08-29 MED ORDER — LIDOCAINE 5 % EX PTCH: 1.0000 | MEDICATED_PATCH | Freq: Two times a day (BID) | CUTANEOUS | 2 refills | Status: DC

## 2022-08-29 MED ORDER — SODIUM HYALURONATE (VISCOSUP) 16.8 MG/2ML IX SOSY
16.8000 mg | PREFILLED_SYRINGE | Freq: Once | INTRA_ARTICULAR | Status: AC
  Administered 2022-08-29: 16.8 mg via INTRA_ARTICULAR

## 2022-08-29 NOTE — Assessment & Plan Note (Signed)
Having acute pain in the left trapezius and periscapular region.  May have underlying nerve impingement. -Lidoderm patches.
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
## 2022-08-29 NOTE — Addendum Note (Signed)
Addended by: Cresenciano Lick on: 08/29/2022 02:37 PM   Modules accepted: Orders

## 2022-08-29 NOTE — Patient Instructions (Signed)
Good to see you Please try heat  Please try the exercises   Please send me a message in MyChart with any questions or updates.  Please see me back in 1 week.   --Dr. Raeford Razor

## 2022-08-29 NOTE — Assessment & Plan Note (Signed)
Completed gelsyn injection. 2/3

## 2022-09-01 ENCOUNTER — Ambulatory Visit (HOSPITAL_COMMUNITY)
Admission: EM | Admit: 2022-09-01 | Discharge: 2022-09-01 | Disposition: A | Discharge status: Home / Self Care | Payer: Medicare Other | Attending: Physician Assistant | Admitting: Physician Assistant

## 2022-09-01 ENCOUNTER — Encounter (HOSPITAL_COMMUNITY): Payer: Self-pay

## 2022-09-01 DIAGNOSIS — M542 Cervicalgia: Secondary | ICD-10-CM

## 2022-09-01 DIAGNOSIS — S46912A Strain of unspecified muscle, fascia and tendon at shoulder and upper arm level, left arm, initial encounter: Secondary | ICD-10-CM | POA: Diagnosis not present

## 2022-09-01 DIAGNOSIS — M25512 Pain in left shoulder: Secondary | ICD-10-CM | POA: Diagnosis not present

## 2022-09-01 MED ORDER — NAPROXEN 500 MG PO TABS: 500.0000 mg | ORAL_TABLET | Freq: Two times a day (BID) | ORAL | 0 refills | Status: DC

## 2022-09-01 MED ORDER — TIZANIDINE HCL 4 MG PO TABS: 4.0000 mg | ORAL_TABLET | Freq: Four times a day (QID) | ORAL | 0 refills | Status: DC | PRN

## 2022-09-01 NOTE — Discharge Instructions (Signed)
Advised ice therapy, 10 minutes on 20 minutes off, 3-4 times throughout the day over the next several days to help reduce pain and discomfort. Advised to use Naprosyn 500 mg every 12 hours with food for the next week to help reduce pain. Advised to use Zanaflex every 6 hours to help reduce muscle spasm and irritability. Advised to follow-up with PCP or return to urgent care if symptoms fail to improve.

## 2022-09-01 NOTE — ED Triage Notes (Signed)
Left side shoulder and neck pain onset last Sunday. No previous injuries, no falls. States it has been sore with out improvement.
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
## 2022-09-02 ENCOUNTER — Ambulatory Visit (HOSPITAL_BASED_OUTPATIENT_CLINIC_OR_DEPARTMENT_OTHER)
Admission: RE | Admit: 2022-09-02 | Discharge: 2022-09-02 | Disposition: A | Discharge status: Home / Self Care | Payer: Medicare Other | Source: Ambulatory Visit | Attending: Medical | Admitting: Medical

## 2022-09-02 ENCOUNTER — Ambulatory Visit (INDEPENDENT_AMBULATORY_CARE_PROVIDER_SITE_OTHER): Payer: Medicare Other | Admitting: Medical

## 2022-09-02 ENCOUNTER — Encounter: Payer: Self-pay | Admitting: Medical

## 2022-09-02 ENCOUNTER — Ambulatory Visit: Payer: Medicare Other | Admitting: Medical

## 2022-09-02 VITALS — BP 147/90 | HR 84 | Temp 98.1°F | Resp 18 | Ht 72.0 in | Wt 191.8 lb

## 2022-09-02 DIAGNOSIS — M25512 Pain in left shoulder: Secondary | ICD-10-CM

## 2022-09-02 DIAGNOSIS — M542 Cervicalgia: Secondary | ICD-10-CM | POA: Diagnosis not present

## 2022-09-02 DIAGNOSIS — M5410 Radiculopathy, site unspecified: Secondary | ICD-10-CM | POA: Diagnosis not present

## 2022-09-02 NOTE — Patient Instructions (Addendum)
For neck and shoulder pain continue plan of ED MD.  1.  The acute neck pain will be treated with the following: A.  Naprosyn 500 mg every 12 hours to treat the acute pain and discomfort. B.  Zanaflex every 6 hours to treat acute muscle spasm. C.  Ice therapy, 10 minutes on 20 minutes off, 3-4 times a day to treat acute pain and muscle spasm. Plan was below.  Reminder no celebrex when on naprosyn.  Be careful on neck position at work. Prior mri finding would indicate maybe susceptible to neck pain.  Get left shoulder xray today.  If pain persists in neck may need repeat mri imaging.  Htn- bp mild high. Mabye combo of pain level and nsaid use.   Recommend getting bp cuff over the counter and check daily. Want to see bp med better than 140/90. If not coming down let me know.  Follow up date to be determined after xray review.  After pt went to ED. He had ct neck. Neuroforaminal narrowing. He wanted mri. Did place order. Will wait to see if he needs prior auth
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
## 2022-09-03 ENCOUNTER — Telehealth: Payer: Self-pay | Admitting: Medical

## 2022-09-03 NOTE — Telephone Encounter (Signed)
Pt is requesting a recheck on his neck. He stated back in 2021 he had signs of degeneration and wanted to see how that was doing. Please advise.

## 2022-09-04 NOTE — Telephone Encounter (Signed)
Patient stated he wanted an MRI and not xray .Marland Kitchen He is okay with MRI

## 2022-09-04 NOTE — Telephone Encounter (Signed)
Pt called stating that he had spoken with HG earlier today (10.18.23) and she had told him that he would be able to call [facility] and schedule his MRI. Pt stated he had called them and they did not have an active order. After reviewing chart, did not see where an order had been sent to them. HG and [name] were not in office at the time and advised a note would be sent back to look into this. Pt acknowledged understanding.

## 2022-09-05 ENCOUNTER — Encounter: Payer: Self-pay | Admitting: Family Medicine

## 2022-09-05 ENCOUNTER — Other Ambulatory Visit: Payer: Self-pay

## 2022-09-05 ENCOUNTER — Ambulatory Visit (INDEPENDENT_AMBULATORY_CARE_PROVIDER_SITE_OTHER): Payer: Medicare Other | Admitting: Family Medicine

## 2022-09-05 ENCOUNTER — Emergency Department (HOSPITAL_COMMUNITY): Payer: Medicare Other

## 2022-09-05 ENCOUNTER — Emergency Department (HOSPITAL_COMMUNITY)
Admission: EM | Admit: 2022-09-05 | Discharge: 2022-09-05 | Disposition: A | Discharge status: Home / Self Care | Payer: Medicare Other | Attending: Emergency Medicine | Admitting: Emergency Medicine

## 2022-09-05 ENCOUNTER — Ambulatory Visit: Payer: Self-pay

## 2022-09-05 VITALS — BP 140/80 | Ht 72.0 in | Wt 191.0 lb

## 2022-09-05 DIAGNOSIS — Z79899 Other long term (current) drug therapy: Secondary | ICD-10-CM | POA: Insufficient documentation

## 2022-09-05 DIAGNOSIS — M25512 Pain in left shoulder: Secondary | ICD-10-CM | POA: Insufficient documentation

## 2022-09-05 DIAGNOSIS — M9971 Connective tissue and disc stenosis of intervertebral foramina of cervical region: Secondary | ICD-10-CM | POA: Insufficient documentation

## 2022-09-05 DIAGNOSIS — M4802 Spinal stenosis, cervical region: Secondary | ICD-10-CM

## 2022-09-05 DIAGNOSIS — M1712 Unilateral primary osteoarthritis, left knee: Secondary | ICD-10-CM | POA: Diagnosis not present

## 2022-09-05 MED ORDER — OXYCODONE-ACETAMINOPHEN 5-325 MG PO TABS
2.0000 | ORAL_TABLET | Freq: Once | ORAL | Status: AC
  Administered 2022-09-05: 2 via ORAL
  Filled 2022-09-05: qty 2

## 2022-09-05 MED ORDER — PREDNISONE 10 MG (21) PO TBPK: ORAL_TABLET | Freq: Every day | ORAL | 0 refills | Status: DC

## 2022-09-05 MED ORDER — SODIUM HYALURONATE (VISCOSUP) 16.8 MG/2ML IX SOSY
16.8000 mg | PREFILLED_SYRINGE | Freq: Once | INTRA_ARTICULAR | Status: AC
  Administered 2022-09-05: 16.8 mg via INTRA_ARTICULAR

## 2022-09-05 NOTE — Telephone Encounter (Signed)
Pt called back stating [facility] wont do MRI , and would like for you to go ahead and place order for MRI

## 2022-09-05 NOTE — Addendum Note (Signed)
Addended by: Cresenciano Lick on: 09/05/2022 02:35 PM   Modules accepted: Orders

## 2022-09-05 NOTE — Assessment & Plan Note (Signed)
Completed Gelsyn series of injections.

## 2022-09-05 NOTE — Patient Instructions (Signed)
Good to see you Please use ice as needed   Please send me a message in MyChart with any questions or updates.  Please see me back in 6-8 weeks.   --Dr. Raeford Razor

## 2022-09-05 NOTE — Discharge Instructions (Signed)
Please use Tylenol or ibuprofen for pain.  You may use 600 mg ibuprofen every 6 hours or 1000 mg of Tylenol every 6 hours.  You may choose to alternate between the 2.  This would be most effective.  Not to exceed 4 g of Tylenol within 24 hours.  Not to exceed 3200 mg ibuprofen 24 hours.  You can use the muscle relaxant you have already been prescribed in addition to the above to help with any breakthrough pain.  You can take it up to twice daily.  It is safe to take at night, but I would be cautious taking it during the day as it can cause some drowsiness.  Make sure that you are feeling awake and alert before you get behind the wheel of a car or operate a motor vehicle.  It is not a narcotic pain medication so you are able to take it if it is not making you drowsy and still pilot a vehicle or machinery safely.  Please start the steroid course that I prescribed and follow-up with your PCP and the neurosurgeon who previously saw discussed localized joint injections and outpatient scheduling of the MRI that your PCP is interested in.  If you have sudden new weakness, numbness, new fall or other injury to the cervical spine you may need a more emergent MRI and I recommend that you return for further evaluation at that time.  Otherwise your symptoms are appropriate for an outpatient MRI, and you can follow-up with the above parties.

## 2022-09-05 NOTE — Addendum Note (Signed)
Addended by: Anabel Halon on: 09/05/2022 02:51 PM   Modules accepted: Orders

## 2022-09-05 NOTE — Telephone Encounter (Signed)
Pt stated ED dr recommend that he does still need an mri. They did a CT scan at [facility], but want pcp to order the mri. He would like it done today if possible.   [facility] AT [place]

## 2022-09-05 NOTE — Telephone Encounter (Signed)
Caller Name [place] Phone Number [phone] Patient Name Cory Benson Patient DOB August 05, 1966 Call Type Message Only Information Provided Reason for Call Request for General Office Information Initial Comment Caller States he has had neck and shoulder pain for about a week and has some concerns about the MRI and he had to call out of work today. Additional Comment office hours provided, caller declined triage Disp. Time Disposition Final User 09/05/2022 7:30:44 AM General Information Provided Yes Mcquiter, Brionn Call Closed By: Hassel Neth Transaction Date/Time: 09/05/2022 7:27:16 AM (ET)

## 2022-09-05 NOTE — Telephone Encounter (Signed)
Pt currently in ED.
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
## 2022-09-05 NOTE — ED Triage Notes (Signed)
Pt reports neck pain radiating into left shoulder x2 weeks. Denies fall/injury. Seen UC and PCP with negative x-ray Hx DDD with surgery per patient Biofreeze with no relief.
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
## 2022-09-08 ENCOUNTER — Ambulatory Visit
Admission: RE | Admit: 2022-09-08 | Discharge: 2022-09-08 | Disposition: A | Discharge status: Home / Self Care | Payer: Medicare Other | Source: Ambulatory Visit | Attending: Medical | Admitting: Medical

## 2022-09-08 DIAGNOSIS — M25512 Pain in left shoulder: Secondary | ICD-10-CM

## 2022-09-08 DIAGNOSIS — M542 Cervicalgia: Secondary | ICD-10-CM

## 2022-09-08 DIAGNOSIS — M5410 Radiculopathy, site unspecified: Secondary | ICD-10-CM

## 2022-09-09 ENCOUNTER — Telehealth: Payer: Self-pay | Admitting: *Deleted

## 2022-09-09 NOTE — Telephone Encounter (Signed)
Resloved by after hours  Call Type Triage / Clinical Relationship To Patient Self Return Phone Number [phone] (Primary) Chief Complaint Medication Question (non symptomatic) Reason for Call Symptomatic / Request for Health Information Initial Comment Caller states his doctor Rx'ed him two new meds and he wants to make sure he won't have any interactions. One is a muscle relaxer and the other steroid. Translation No Nurse Assessment Nurse: Windle Guard, RN, Olin Hauser Date/Time (Eastern Time): 09/07/2022 10:39:42 AM Confirm and document reason for call. If symptomatic, describe symptoms. ---Caller states his doctor prescribed him two new meds , requesting interactions between meds (prednisone & Tizanidine) Denies new or worsening symptoms Does the patient have any new or worsening symptoms? ---No Please document clinical information provided and list any resource used. ---Using [employer] Interaction Checker as resource, caller advised no interactions were found between medications

## 2022-09-11 ENCOUNTER — Telehealth: Payer: Self-pay | Admitting: Medical

## 2022-09-11 NOTE — Telephone Encounter (Signed)
Pt called and labs were reviewed

## 2022-09-11 NOTE — Telephone Encounter (Signed)
Pt called to go over imaging. Please advise.

## 2022-09-12 ENCOUNTER — Ambulatory Visit (HOSPITAL_COMMUNITY): Payer: Medicare Other

## 2022-09-12 ENCOUNTER — Telehealth: Payer: Self-pay | Admitting: Medical

## 2022-09-12 NOTE — Telephone Encounter (Signed)
Pt called to provide the following information regarding his surgeon:  [name] [id number], [place], [place] [address] P: [phone] Appt: 11.17.23 @ 9a

## 2022-09-13 ENCOUNTER — Telehealth: Payer: Self-pay | Admitting: Medical

## 2022-09-13 NOTE — Telephone Encounter (Signed)
Placed referral  

## 2022-09-13 NOTE — Addendum Note (Signed)
Addended by: Anabel Halon on: 09/13/2022 06:52 AM   Modules accepted: Orders

## 2022-09-27 ENCOUNTER — Ambulatory Visit (INDEPENDENT_AMBULATORY_CARE_PROVIDER_SITE_OTHER): Payer: Medicare Other | Admitting: *Deleted

## 2022-09-27 DIAGNOSIS — Z0000 Encounter for general adult medical examination without abnormal findings: Secondary | ICD-10-CM | POA: Diagnosis not present

## 2022-09-27 MED ORDER — CELECOXIB 100 MG PO CAPS: 100.0000 mg | ORAL_CAPSULE | Freq: Two times a day (BID) | ORAL | 2 refills | Status: DC

## 2022-09-27 MED ORDER — CYCLOBENZAPRINE HCL 10 MG PO TABS: 10.0000 mg | ORAL_TABLET | Freq: Two times a day (BID) | ORAL | 2 refills | Status: DC | PRN

## 2022-09-27 NOTE — Patient Instructions (Signed)
Mr. Cory Benson , Thank you for taking time to come for your Medicare Wellness Visit. I appreciate your ongoing commitment to your health goals. Please review the following plan we discussed and let me know if I can assist you in the future.   These are the goals we discussed:  Goals      Chronic Care [place] (see longitudinal plan of care for additional care plan information)  Current Barriers:  Chronic Disease Management support, education, and care coordination needs related to Tobacco Use Disorder, Pre-Diabetes, Hypertension, Pain, GERD    Hypertension BP Readings from Last 3 Encounters:  01/04/22 130/80  12/28/21 128/86  12/03/21 117/72  Pharmacist Clinical Goal(s): Over the next 90 days, patient will work with PharmD and providers to maintain BP goal <140/90 Current regimen:  Losartan '25mg'$  daily Interventions: Requested patient to purchase a blood pressure cuff (completed) Requested patient to check blood pressure 2-3 times per week  Patient self care activities - Over the next 90 days, patient will: Restart losartan '25mg'$  daily - has discussion with PCP regarding if you can stop losartan or not.  Check blood presure 2 to 3 times per week, document, and provide at future appointments Ensure daily salt intake < 2300 mg/day  Pre-Diabetes Lab Results  Component Value Date/Time   HGBA1C 6.0 01/21/2020 11:25 AM  Pharmacist Clinical Goal(s): Over the next 90 days, patient will work with PharmD and providers to maintain A1c goal <6.5% Current regimen:  Diet and exercise management   Interventions: Discussed importance of limiting carbohydrates Patient self care activities - Over the next 180 days, patient will: Limit carbohydrates to 45-60 grams per meal Due to have A1c rechecked.   Tobacco Use Disorder Pharmacist Clinical Goal(s) Over the next 90 days, patient will work with PharmD and providers to stop smoking Current regimen:   Bupropion XL '150mg'$  daily Patient self care activities - Over the next 90 days, patient will: Continue bupropion to help with smoking cessation Plan to decrease number of cigarettes smoked to 1 or 2 per day for the first 3 to 7 days of restarting bupropion.  Pain (Goal: Manage symptoms) Current treatment  Celecoxib '100mg'$  twice a day Flexeril '10mg'$  prn Gabapentin '800mg'$  bid prn Interventions:  Continue current regimen for pain control  Medication management Pharmacist Clinical Goal(s): Over the next 90 days, patient will work with PharmD and providers to maintain optimal medication adherence Current pharmacy: [facility]  Interventions Comprehensive medication review performed. Continue current medication management strategy Patient self care activities - Over the next 90 days, patient will: Focus on medication adherence by filling medications appropriately Take medications as prescribed Report any questions or concerns to PharmD and/or provider(s)   Patient Goals/Self-Care Activities Over the next 90 days, patient will:  Take medications as prescribed check blood pressure 2 to 3 times per week, document, and provide at future appointments Consider starting exercise program if approved by pain management / physical therapist.  Follow Up Plan: no Chronic Care Management follow up planned. Patient case will be closed out but he is aware he can reconnect with Chronic Care Management team / clinical pharmacist at any time.  Please see past updates related to this goal by clicking on the "Past Updates" button in the selected goal       Manage Chronic Pain     Timeframe:  Long-Range Goal Priority:  High Start Date:  01/05/21  Expected End Date:  07/05/21                     Follow Up Date 03/17/21   - call for medicine refill 2 or 3 days before it runs out - use ice or heat for pain relief    Why is this important?   Day-to-day life can  be hard when you have chronic pain.  Pain medicine is just one piece of the treatment puzzle.  You can try these action steps to help you manage your pain.    Notes:  Continue smoking cessation so surgery can move forward.     Patient Stated     Increase mobility & start working out again        This is a list of the screening recommended for you and due dates:  Health Maintenance  Topic Date Due   COVID-19 Vaccine (1) Never done   Zoster (Shingles) Vaccine (1 of 2) Never done   Flu Shot  02/16/2023*   Medicare Annual Wellness Visit  09/28/2023   Colon Cancer Screening  01/31/2027   Tetanus Vaccine  06/28/2031   Hepatitis C Screening: USPSTF Recommendation to screen - Ages 18-79 yo.  Completed   HIV Screening  Completed   HPV Vaccine  Aged Out  *Topic was postponed. The date shown is not the original due date.     Next appointment: Follow up in one year for your annual wellness visit.  Preventive Care 40-64 Years, Male Preventive care refers to lifestyle choices and visits with your health care provider that can promote health and wellness. What does preventive care include? A yearly physical exam. This is also called an annual well check. Dental exams once or twice a year. Routine eye exams. Ask your health care provider how often you should have your eyes checked. Personal lifestyle choices, including: Daily care of your teeth and gums. Regular physical activity. Eating a healthy diet. Avoiding tobacco and drug use. Limiting alcohol use. Practicing safe sex. Taking low-dose aspirin every day starting at age 12. What happens during an annual well check? The services and screenings done by your health care provider during your annual well check will depend on your age, overall health, lifestyle risk factors, and family history of disease. Counseling  Your health care provider may ask you questions about your: Alcohol use. Tobacco use. Drug use. Emotional  well-being. Home and relationship well-being. Sexual activity. Eating habits. Work and work Statistician. Screening  You may have the following tests or measurements: Height, weight, and BMI. Blood pressure. Lipid and cholesterol levels. These may be checked every 5 years, or more frequently if you are over 26 years old. Skin check. Lung cancer screening. You may have this screening every year starting at age 57 if you have a 30-pack-year history of smoking and currently smoke or have quit within the past 15 years. Fecal occult blood test (FOBT) of the stool. You may have this test every year starting at age 3. Flexible sigmoidoscopy or colonoscopy. You may have a sigmoidoscopy every 5 years or a colonoscopy every 10 years starting at age 72. Prostate cancer screening. Recommendations will vary depending on your family history and other risks. Hepatitis C blood test. Hepatitis B blood test. Sexually transmitted disease (STD) testing. Diabetes screening. This is done by checking your blood sugar (glucose) after you have not eaten for a while (fasting). You may have this done every 1-3 years. Discuss your test results, treatment  options, and if necessary, the need for more tests with your health care provider. Vaccines  Your health care provider may recommend certain vaccines, such as: Influenza vaccine. This is recommended every year. Tetanus, diphtheria, and acellular pertussis (Tdap, Td) vaccine. You may need a Td booster every 10 years. Zoster vaccine. You may need this after age 64. Pneumococcal 13-valent conjugate (PCV13) vaccine. You may need this if you have certain conditions and have not been vaccinated. Pneumococcal polysaccharide (PPSV23) vaccine. You may need one or two doses if you smoke cigarettes or if you have certain conditions. Talk to your health care provider about which screenings and vaccines you need and how often you need them. This information is not intended to  replace advice given to you by your health care provider. Make sure you discuss any questions you have with your health care provider. Document Released: 12/01/2015 Document Revised: 07/24/2016 Document Reviewed: 09/05/2015 Elsevier Interactive Patient Education  2017 [place] Prevention in the Home Falls can cause injuries. They can happen to people of all ages. There are many things you can do to make your home safe and to help prevent falls. What can I do on the outside of my home? Regularly fix the edges of walkways and driveways and fix any cracks. Remove anything that might make you trip as you walk through a door, such as a raised step or threshold. Trim any bushes or trees on the path to your home. Use bright outdoor lighting. Clear any walking paths of anything that might make someone trip, such as rocks or tools. Regularly check to see if handrails are loose or broken. Make sure that both sides of any steps have handrails. Any raised decks and porches should have guardrails on the edges. Have any leaves, snow, or ice cleared regularly. Use sand or salt on walking paths during winter. Clean up any spills in your garage right away. This includes oil or grease spills. What can I do in the bathroom? Use night lights. Install grab bars by the toilet and in the tub and shower. Do not use towel bars as grab bars. Use non-skid mats or decals in the tub or shower. If you need to sit down in the shower, use a plastic, non-slip stool. Keep the floor dry. Clean up any water that spills on the floor as soon as it happens. Remove soap buildup in the tub or shower regularly. Attach bath mats securely with double-sided non-slip rug tape. Do not have throw rugs and other things on the floor that can make you trip. What can I do in the bedroom? Use night lights. Make sure that you have a light by your bed that is easy to reach. Do not use any sheets or blankets that are too big for  your bed. They should not hang down onto the floor. Have a firm chair that has side arms. You can use this for support while you get dressed. Do not have throw rugs and other things on the floor that can make you trip. What can I do in the kitchen? Clean up any spills right away. Avoid walking on wet floors. Keep items that you use a lot in easy-to-reach places. If you need to reach something above you, use a strong step stool that has a grab bar. Keep electrical cords out of the way. Do not use floor polish or wax that makes floors slippery. If you must use wax, use non-skid floor wax. Do not have  throw rugs and other things on the floor that can make you trip. What can I do with my stairs? Do not leave any items on the stairs. Make sure that there are handrails on both sides of the stairs and use them. Fix handrails that are broken or loose. Make sure that handrails are as long as the stairways. Check any carpeting to make sure that it is firmly attached to the stairs. Fix any carpet that is loose or worn. Avoid having throw rugs at the top or bottom of the stairs. If you do have throw rugs, attach them to the floor with carpet tape. Make sure that you have a light switch at the top of the stairs and the bottom of the stairs. If you do not have them, ask someone to add them for you. What else can I do to help prevent falls? Wear shoes that: Do not have high heels. Have rubber bottoms. Are comfortable and fit you well. Are closed at the toe. Do not wear sandals. If you use a stepladder: Make sure that it is fully opened. Do not climb a closed stepladder. Make sure that both sides of the stepladder are locked into place. Ask someone to hold it for you, if possible. Clearly mark and make sure that you can see: Any grab bars or handrails. First and last steps. Where the edge of each step is. Use tools that help you move around (mobility aids) if they are needed. These  include: Canes. Walkers. Scooters. Crutches. Turn on the lights when you go into a dark area. Replace any light bulbs as soon as they burn out. Set up your furniture so you have a clear path. Avoid moving your furniture around. If any of your floors are uneven, fix them. If there are any pets around you, be aware of where they are. Review your medicines with your doctor. Some medicines can make you feel dizzy. This can increase your chance of falling. Ask your doctor what other things that you can do to help prevent falls. This information is not intended to replace advice given to you by your health care provider. Make sure you discuss any questions you have with your health care provider. Document Released: 08/31/2009 Document Revised: 04/11/2016 Document Reviewed: 12/09/2014 Elsevier Interactive Patient Education  2017 [employer].
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
## 2022-10-14 ENCOUNTER — Ambulatory Visit (INDEPENDENT_AMBULATORY_CARE_PROVIDER_SITE_OTHER): Payer: Self-pay | Admitting: Family Medicine

## 2022-10-14 VITALS — BP 126/80 | Ht 72.0 in | Wt 190.0 lb

## 2022-10-14 DIAGNOSIS — M1612 Unilateral primary osteoarthritis, left hip: Secondary | ICD-10-CM

## 2022-10-14 NOTE — Assessment & Plan Note (Signed)
Acutely occurring. Too soon for another injection. He'll follow up in a few weeks.
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
## 2022-10-14 NOTE — Patient Instructions (Signed)
good to see you  Please send me a message in MyChart with any questions or updates.  Please see me back after 12/12 to do the hip injection.   --Dr. Raeford Razor

## 2022-10-24 ENCOUNTER — Telehealth: Payer: Self-pay | Admitting: Medical

## 2022-10-24 MED ORDER — TADALAFIL 20 MG PO TABS: ORAL_TABLET | ORAL | 3 refills | Status: DC

## 2022-10-24 NOTE — Telephone Encounter (Signed)
Prescription Request  10/24/2022  Is this a "Controlled Substance" medicine? No  LOV: 09/02/2022  What is the name of the medication or equipment?   tadalafil (CIALIS) 20 MG tablet [[id number]]   Have you contacted your pharmacy to request a refill? No   Which pharmacy would you like this sent to?  [place], [place] [address] Phone: [phone] Fax: [phone]    Patient notified that their request is being sent to the clinical staff for review and that they should receive a response within 2 business days.   Please advise at Mobile [phone] (mobile)

## 2022-10-24 NOTE — Telephone Encounter (Signed)
Rx sent 

## 2022-10-27 ENCOUNTER — Other Ambulatory Visit: Payer: Self-pay | Admitting: Medical

## 2022-10-30 ENCOUNTER — Ambulatory Visit: Payer: Self-pay

## 2022-10-30 ENCOUNTER — Ambulatory Visit (INDEPENDENT_AMBULATORY_CARE_PROVIDER_SITE_OTHER): Payer: Medicare Other | Admitting: Family Medicine

## 2022-10-30 ENCOUNTER — Encounter: Payer: Self-pay | Admitting: Family Medicine

## 2022-10-30 VITALS — BP 138/96 | Ht 72.0 in | Wt 190.0 lb

## 2022-10-30 DIAGNOSIS — M1612 Unilateral primary osteoarthritis, left hip: Secondary | ICD-10-CM | POA: Diagnosis not present

## 2022-10-30 MED ORDER — TRIAMCINOLONE ACETONIDE 40 MG/ML IJ SUSP
40.0000 mg | Freq: Once | INTRAMUSCULAR | Status: AC
  Administered 2022-10-30: 40 mg via INTRA_ARTICULAR
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
## 2022-10-30 NOTE — Patient Instructions (Signed)
Good to see you Please use ice as needed  Please send me a message in MyChart with any questions or updates.  Please see me back as needed.   --Dr. Jame Seelig  

## 2022-10-30 NOTE — Assessment & Plan Note (Signed)
Completed injection today.

## 2022-11-05 ENCOUNTER — Ambulatory Visit (INDEPENDENT_AMBULATORY_CARE_PROVIDER_SITE_OTHER): Payer: Medicare Other | Admitting: Medical

## 2022-11-05 VITALS — BP 140/90 | HR 62 | Temp 98.0°F | Resp 18 | Ht 72.0 in | Wt 198.0 lb

## 2022-11-05 DIAGNOSIS — B351 Tinea unguium: Secondary | ICD-10-CM | POA: Diagnosis not present

## 2022-11-05 DIAGNOSIS — I10 Essential (primary) hypertension: Secondary | ICD-10-CM | POA: Diagnosis not present

## 2022-11-05 DIAGNOSIS — L603 Nail dystrophy: Secondary | ICD-10-CM | POA: Diagnosis not present

## 2022-11-05 NOTE — Patient Instructions (Addendum)
Probable onychomycosis of nail. Will get koh nail study and cmp.  If koh + and lft normal then rx lamisil. Rx advisement given.  Htn- bp borderline mild high. Continue losartan 25 mg daily. Check bp at home and would like to see closer to 130/80  Follow up date pending lab review.

## 2022-11-05 NOTE — Progress Notes (Signed)
   Subjective:    Patient ID: Cory Benson, male    DOB: 19-Sep-1966, 56 y.o.   MRN: [id number]  HPI  Pt has bilaeral thick appearance to nails. Slight darken of nails thoughout. 2nd toe nail of left foot little darker. Patient had this for years but now wife saw and brought to his attention.     Review of Systems     Objective:   Physical Exam  General- No acute distress. Pleasant patient. Feet- most of nails are darker, thicker and disfigure. 4th toes on both feet look normal. 2nd toe on left foot very thick. Moderate dark yellow color to nails. Some of patients toe nails obscurred with nail polish.        Assessment & Plan:   Patient Instructions  Probable onychomycosis of nail. Will get koh nail study and cmp.  If koh + and lft normal then rx lamisil. Rx advisement given.  Htn- bp borderline mild high. Continue losartan 25 mg daily. Check bp at home and would like to see closer to 130/80  Follow up date pending lab review.   Mackie Pai, [name]

## 2022-11-06 LAB — COMPREHENSIVE METABOLIC PANEL
ALT: 20 U/L (ref 0–53)
AST: 19 U/L (ref 0–37)
Albumin: 4.2 g/dL (ref 3.5–5.2)
Alkaline Phosphatase: 59 U/L (ref 39–117)
BUN: 16 mg/dL (ref 6–23)
CO2: 28 mEq/L (ref 19–32)
Calcium: 9.3 mg/dL (ref 8.4–10.5)
Chloride: 101 mEq/L (ref 96–112)
Creatinine, Ser: 1.14 mg/dL (ref 0.40–1.50)
GFR: 71.54 mL/min (ref >60.00)
Glucose, Bld: 87 mg/dL (ref 70–99)
Potassium: 4.4 mEq/L (ref 3.5–5.1)
Sodium: 137 mEq/L (ref 135–145)
Total Bilirubin: 0.4 mg/dL (ref 0.2–1.2)
Total Protein: 6.9 g/dL (ref 6.0–8.3)

## 2022-11-07 LAB — FUNGAL STAIN
FUNGAL SMEAR:: NONE SEEN
MICRO NUMBER:: 14334641
SPECIMEN QUALITY:: ADEQUATE

## 2022-11-07 NOTE — Addendum Note (Signed)
Addended by: Anabel Halon on: 11/07/2022 04:01 PM   Modules accepted: Orders

## 2022-11-26 ENCOUNTER — Ambulatory Visit (INDEPENDENT_AMBULATORY_CARE_PROVIDER_SITE_OTHER): Payer: 59 | Admitting: Podiatry

## 2022-11-26 DIAGNOSIS — L603 Nail dystrophy: Secondary | ICD-10-CM

## 2022-11-26 NOTE — Progress Notes (Signed)
  Subjective:  Patient ID: Cory Benson, male    DOB: [birth year],  MRN: [id number]  Chief Complaint  Patient presents with   Nail Problem     Dystrophic nail; left foot discolored    57 y.o. male presents with the above complaint. History confirmed with patient.  Second toenail is still thickened and elongated, he saw his PCP and they took a fungal sample and it did not show any fungus  Objective:  Physical Exam: warm, good capillary refill, no trophic changes or ulcerative lesions, normal DP and PT pulses, and normal sensory exam. Left Foot: Long second toe with hammertoe, dystrophic nail, similar on right but less severe  Fungal stain of nail specimen negative for fungal evidence Assessment:   1. Dystrophic nail      Plan:  Patient was evaluated and treated and all questions answered.  We again discussed etiology of the dystrophic nail and how it contributes from his second hammertoe which is asymptomatic for him.  We discussed that these are likely hereditary and only if they become symptomatic would I recommend surgical intervention.  I do not think there is a way to return his nail to a normal shape like it was previously.  We discussed permanent removal of the nail plate if it became painful.  He will return to see me as needed and I discussed this with his wife as well who is present today   Return if symptoms worsen or fail to improve.

## 2022-12-10 ENCOUNTER — Ambulatory Visit (INDEPENDENT_AMBULATORY_CARE_PROVIDER_SITE_OTHER): Payer: 59 | Admitting: Medical

## 2022-12-10 VITALS — BP 112/88 | HR 77 | Resp 18 | Ht 72.0 in | Wt 194.0 lb

## 2022-12-10 DIAGNOSIS — M25561 Pain in right knee: Secondary | ICD-10-CM

## 2022-12-10 DIAGNOSIS — M25562 Pain in left knee: Secondary | ICD-10-CM | POA: Diagnosis not present

## 2022-12-10 DIAGNOSIS — G8929 Other chronic pain: Secondary | ICD-10-CM

## 2022-12-10 MED ORDER — TADALAFIL 20 MG PO TABS: ORAL_TABLET | ORAL | 3 refills | Status: DC

## 2022-12-10 MED ORDER — ATORVASTATIN CALCIUM 10 MG PO TABS: ORAL_TABLET | ORAL | 1 refills | Status: DC
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
## 2022-12-10 NOTE — Patient Instructions (Signed)
1. Chronic pain of both knees Refer to sports med to further evaluate due to hx of known meniscus tear left side and may need rt knee mri. Can increase your celebrex to twice daily as needed.  - Ambulatory referral to Sports Medicine   For high cholesterol refilled your atorvastatin.  For ED refilled your cialis.  Follow up wellness exam within 1-2 months. Early morning fasting appointment.

## 2022-12-11 DIAGNOSIS — M5412 Radiculopathy, cervical region: Secondary | ICD-10-CM | POA: Diagnosis not present

## 2022-12-11 DIAGNOSIS — M479 Spondylosis, unspecified: Secondary | ICD-10-CM | POA: Diagnosis not present

## 2022-12-11 DIAGNOSIS — M5030 Other cervical disc degeneration, unspecified cervical region: Secondary | ICD-10-CM | POA: Diagnosis not present

## 2022-12-11 DIAGNOSIS — M542 Cervicalgia: Secondary | ICD-10-CM | POA: Diagnosis not present

## 2022-12-11 DIAGNOSIS — M4802 Spinal stenosis, cervical region: Secondary | ICD-10-CM | POA: Diagnosis not present

## 2022-12-14 ENCOUNTER — Other Ambulatory Visit: Payer: Self-pay | Admitting: Medical

## 2022-12-17 ENCOUNTER — Ambulatory Visit (INDEPENDENT_AMBULATORY_CARE_PROVIDER_SITE_OTHER): Payer: 59 | Admitting: Family Medicine

## 2022-12-17 ENCOUNTER — Ambulatory Visit (INDEPENDENT_AMBULATORY_CARE_PROVIDER_SITE_OTHER): Payer: 59 | Admitting: Medical

## 2022-12-17 ENCOUNTER — Encounter: Payer: Self-pay | Admitting: Family Medicine

## 2022-12-17 ENCOUNTER — Ambulatory Visit: Payer: Self-pay

## 2022-12-17 VITALS — BP 130/74 | Ht 73.0 in | Wt 190.0 lb

## 2022-12-17 VITALS — BP 128/88 | HR 70 | Temp 98.0°F | Resp 18 | Ht 73.0 in | Wt 190.0 lb

## 2022-12-17 DIAGNOSIS — M170 Bilateral primary osteoarthritis of knee: Secondary | ICD-10-CM

## 2022-12-17 DIAGNOSIS — Z125 Encounter for screening for malignant neoplasm of prostate: Secondary | ICD-10-CM

## 2022-12-17 DIAGNOSIS — R519 Headache, unspecified: Secondary | ICD-10-CM

## 2022-12-17 DIAGNOSIS — Z23 Encounter for immunization: Secondary | ICD-10-CM

## 2022-12-17 DIAGNOSIS — Z0001 Encounter for general adult medical examination with abnormal findings: Secondary | ICD-10-CM | POA: Diagnosis not present

## 2022-12-17 DIAGNOSIS — Z0000 Encounter for general adult medical examination without abnormal findings: Secondary | ICD-10-CM

## 2022-12-17 LAB — COMPREHENSIVE METABOLIC PANEL
ALT: 22 U/L (ref 0–53)
AST: 22 U/L (ref 0–37)
Albumin: 4.3 g/dL (ref 3.5–5.2)
Alkaline Phosphatase: 57 U/L (ref 39–117)
BUN: 13 mg/dL (ref 6–23)
CO2: 26 mEq/L (ref 19–32)
Calcium: 9.3 mg/dL (ref 8.4–10.5)
Chloride: 103 mEq/L (ref 96–112)
Creatinine, Ser: 0.94 mg/dL (ref 0.40–1.50)
GFR: 90.1 mL/min (ref >60.00)
Glucose, Bld: 89 mg/dL (ref 70–99)
Potassium: 4.3 mEq/L (ref 3.5–5.1)
Sodium: 136 mEq/L (ref 135–145)
Total Bilirubin: 0.5 mg/dL (ref 0.2–1.2)
Total Protein: 7.1 g/dL (ref 6.0–8.3)

## 2022-12-17 LAB — LIPID PANEL
Cholesterol: 127 mg/dL (ref 0–200)
HDL: 50.6 mg/dL (ref >39.00)
LDL Cholesterol: 66 mg/dL (ref 0–99)
NonHDL: 76.19
Total CHOL/HDL Ratio: 3
Triglycerides: 53 mg/dL (ref 0.0–149.0)
VLDL: 10.6 mg/dL (ref 0.0–40.0)

## 2022-12-17 LAB — CBC WITH DIFFERENTIAL/PLATELET
Basophils Absolute: 0 10*3/uL (ref 0.0–0.1)
Basophils Relative: 0.8 % (ref 0.0–3.0)
Eosinophils Absolute: 0.1 10*3/uL (ref 0.0–0.7)
Eosinophils Relative: 2.4 % (ref 0.0–5.0)
HCT: 42.8 % (ref 39.0–52.0)
Hemoglobin: 14.8 g/dL (ref 13.0–17.0)
Lymphocytes Relative: 32.9 % (ref 12.0–46.0)
Lymphs Abs: 1.8 10*3/uL (ref 0.7–4.0)
MCHC: 34.6 g/dL (ref 30.0–36.0)
MCV: 94.5 fl (ref 78.0–100.0)
Monocytes Absolute: 0.6 10*3/uL (ref 0.1–1.0)
Monocytes Relative: 11.2 % (ref 3.0–12.0)
Neutro Abs: 2.8 10*3/uL (ref 1.4–7.7)
Neutrophils Relative %: 52.7 % (ref 43.0–77.0)
Platelets: 197 10*3/uL (ref 150.0–400.0)
RBC: 4.53 Mil/uL (ref 4.22–5.81)
RDW: 14.6 % (ref 11.5–15.5)
WBC: 5.3 10*3/uL (ref 4.0–10.5)

## 2022-12-17 LAB — PSA: PSA: 0.42 ng/mL (ref 0.10–4.00)

## 2022-12-17 MED ORDER — TRIAMCINOLONE ACETONIDE 40 MG/ML IJ SUSP
40.0000 mg | Freq: Once | INTRAMUSCULAR | Status: AC
  Administered 2022-12-17: 40 mg via INTRA_ARTICULAR
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
## 2022-12-17 NOTE — Patient Instructions (Addendum)
For you wellness exam today I have ordered cbc, cmp and lipid panel. Include screening psa.  Shingrix vaccine today.  Recommend exercise and healthy diet.  We will let you know lab results as they come in.  Follow up date appointment will be determined after lab review.    Discussion on recent scalp area pain that you had briefly yesterday. Scalp exam negative and neurologic exam normal. If this occurs again let me know. Particularly if you were to get vision changes, ha, nausea, vomiting or dizziness. If any such symptoms occur like that be seen in ED.    Preventive Care 35-15 Years Old, Male Preventive care refers to lifestyle choices and visits with your health care provider that can promote health and wellness. Preventive care visits are also called wellness exams. What can I expect for my preventive care visit? Counseling During your preventive care visit, your health care provider may ask about your: Medical history, including: Past medical problems. Family medical history. Current health, including: Emotional well-being. Home life and relationship well-being. Sexual activity. Lifestyle, including: Alcohol, nicotine or tobacco, and drug use. Access to firearms. Diet, exercise, and sleep habits. Safety issues such as seatbelt and bike helmet use. Sunscreen use. Work and work Statistician. Physical exam Your health care provider will check your: Height and weight. These may be used to calculate your BMI (body mass index). BMI is a measurement that tells if you are at a healthy weight. Waist circumference. This measures the distance around your waistline. This measurement also tells if you are at a healthy weight and may help predict your risk of certain diseases, such as type 2 diabetes and high blood pressure. Heart rate and blood pressure. Body temperature. Skin for abnormal spots. What immunizations do I need?  Vaccines are usually given at various ages, according to a  schedule. Your health care provider will recommend vaccines for you based on your age, medical history, and lifestyle or other factors, such as travel or where you work. What tests do I need? Screening Your health care provider may recommend screening tests for certain conditions. This may include: Lipid and cholesterol levels. Diabetes screening. This is done by checking your blood sugar (glucose) after you have not eaten for a while (fasting). Hepatitis B test. Hepatitis C test. HIV (human immunodeficiency virus) test. STI (sexually transmitted infection) testing, if you are at risk. Lung cancer screening. Prostate cancer screening. Colorectal cancer screening. Talk with your health care provider about your test results, treatment options, and if necessary, the need for more tests. Follow these instructions at home: Eating and drinking  Eat a diet that includes fresh fruits and vegetables, whole grains, lean protein, and low-fat dairy products. Take vitamin and mineral supplements as recommended by your health care provider. Do not drink alcohol if your health care provider tells you not to drink. If you drink alcohol: Limit how much you have to 0-2 drinks a day. Know how much alcohol is in your drink. In the [place] one drink equals one 12 oz bottle of beer (355 mL), one 5 oz glass of wine (148 mL), or one 1 oz glass of hard liquor (44 mL). Lifestyle Brush your teeth every morning and night with fluoride toothpaste. Floss one time each day. Exercise for at least 30 minutes 5 or more days each week. Do not use any products that contain nicotine or tobacco. These products include cigarettes, chewing tobacco, and vaping devices, such as e-cigarettes. If you need help quitting, ask your  health care provider. Do not use drugs. If you are sexually active, practice safe sex. Use a condom or other form of protection to prevent STIs. Take aspirin only as told by your health care provider. Make  sure that you understand how much to take and what form to take. Work with your health care provider to find out whether it is safe and beneficial for you to take aspirin daily. Find healthy ways to manage stress, such as: Meditation, yoga, or listening to music. Journaling. Talking to a trusted person. Spending time with friends and family. Minimize exposure to UV radiation to reduce your risk of skin cancer. Safety Always wear your seat belt while driving or riding in a vehicle. Do not drive: If you have been drinking alcohol. Do not ride with someone who has been drinking. When you are tired or distracted. While texting. If you have been using any mind-altering substances or drugs. Wear a helmet and other protective equipment during sports activities. If you have firearms in your house, make sure you follow all gun safety procedures. What's next? Go to your health care provider once a year for an annual wellness visit. Ask your health care provider how often you should have your eyes and teeth checked. Stay up to date on all vaccines. This information is not intended to replace advice given to you by your health care provider. Make sure you discuss any questions you have with your health care provider. Document Revised: 05/02/2021 Document Reviewed: 05/02/2021 Elsevier Patient Education  Cory Benson.

## 2022-12-17 NOTE — Assessment & Plan Note (Signed)
Acute on chronic of the left knee and acutely occurring on the right knee.  Mild effusion appreciated bilaterally with known degenerative changes. -Counseled on home exercise therapy and supportive care. -Bilateral injections today. -Could consider genicular nerve ablation

## 2022-12-17 NOTE — Patient Instructions (Signed)
Good to see you Please use ice as needed  Please let me know when the pain just starts to return and I can send you for the nerve ablation of the knees   Please send me a message in MyChart with any questions or updates.  Please see me back as needed.   --Dr. Raeford Razor

## 2022-12-17 NOTE — Addendum Note (Signed)
Addended by: Jeronimo Greaves on: 12/17/2022 11:43 AM   Modules accepted: Orders

## 2022-12-20 ENCOUNTER — Telehealth: Payer: Self-pay | Admitting: Medical

## 2022-12-20 ENCOUNTER — Encounter: Payer: Self-pay | Admitting: Medical

## 2022-12-20 NOTE — Telephone Encounter (Signed)
Patient said he got his shingles vaccine on Tuesday and has been hiccupping every day, all day since then. Please advise patient

## 2023-01-09 DIAGNOSIS — M5030 Other cervical disc degeneration, unspecified cervical region: Secondary | ICD-10-CM | POA: Diagnosis not present

## 2023-01-09 DIAGNOSIS — M5412 Radiculopathy, cervical region: Secondary | ICD-10-CM | POA: Diagnosis not present

## 2023-01-09 DIAGNOSIS — M4802 Spinal stenosis, cervical region: Secondary | ICD-10-CM | POA: Diagnosis not present

## 2023-01-09 DIAGNOSIS — M479 Spondylosis, unspecified: Secondary | ICD-10-CM | POA: Diagnosis not present

## 2023-01-09 DIAGNOSIS — M542 Cervicalgia: Secondary | ICD-10-CM | POA: Diagnosis not present

## 2023-01-14 DIAGNOSIS — M5412 Radiculopathy, cervical region: Secondary | ICD-10-CM | POA: Diagnosis not present

## 2023-01-14 DIAGNOSIS — M479 Spondylosis, unspecified: Secondary | ICD-10-CM | POA: Diagnosis not present

## 2023-01-14 DIAGNOSIS — M4802 Spinal stenosis, cervical region: Secondary | ICD-10-CM | POA: Diagnosis not present

## 2023-01-14 DIAGNOSIS — M542 Cervicalgia: Secondary | ICD-10-CM | POA: Diagnosis not present

## 2023-01-14 DIAGNOSIS — M5030 Other cervical disc degeneration, unspecified cervical region: Secondary | ICD-10-CM | POA: Diagnosis not present

## 2023-01-16 ENCOUNTER — Other Ambulatory Visit: Payer: Self-pay | Admitting: Medical

## 2023-01-16 DIAGNOSIS — M5412 Radiculopathy, cervical region: Secondary | ICD-10-CM | POA: Diagnosis not present

## 2023-01-16 DIAGNOSIS — M479 Spondylosis, unspecified: Secondary | ICD-10-CM | POA: Diagnosis not present

## 2023-01-16 DIAGNOSIS — M5030 Other cervical disc degeneration, unspecified cervical region: Secondary | ICD-10-CM | POA: Diagnosis not present

## 2023-01-16 DIAGNOSIS — M4802 Spinal stenosis, cervical region: Secondary | ICD-10-CM | POA: Diagnosis not present

## 2023-01-16 DIAGNOSIS — M542 Cervicalgia: Secondary | ICD-10-CM | POA: Diagnosis not present

## 2023-02-04 DIAGNOSIS — M4802 Spinal stenosis, cervical region: Secondary | ICD-10-CM | POA: Diagnosis not present

## 2023-02-04 DIAGNOSIS — M5412 Radiculopathy, cervical region: Secondary | ICD-10-CM | POA: Diagnosis not present

## 2023-02-04 DIAGNOSIS — M479 Spondylosis, unspecified: Secondary | ICD-10-CM | POA: Diagnosis not present

## 2023-02-04 DIAGNOSIS — M5030 Other cervical disc degeneration, unspecified cervical region: Secondary | ICD-10-CM | POA: Diagnosis not present

## 2023-02-04 DIAGNOSIS — M542 Cervicalgia: Secondary | ICD-10-CM | POA: Diagnosis not present

## 2023-02-11 DIAGNOSIS — M4802 Spinal stenosis, cervical region: Secondary | ICD-10-CM | POA: Diagnosis not present

## 2023-02-11 DIAGNOSIS — M5030 Other cervical disc degeneration, unspecified cervical region: Secondary | ICD-10-CM | POA: Diagnosis not present

## 2023-02-11 DIAGNOSIS — M5412 Radiculopathy, cervical region: Secondary | ICD-10-CM | POA: Diagnosis not present

## 2023-02-11 DIAGNOSIS — M542 Cervicalgia: Secondary | ICD-10-CM | POA: Diagnosis not present

## 2023-02-11 DIAGNOSIS — M479 Spondylosis, unspecified: Secondary | ICD-10-CM | POA: Diagnosis not present

## 2023-02-19 ENCOUNTER — Emergency Department (HOSPITAL_COMMUNITY)
Admission: EM | Admit: 2023-02-19 | Discharge: 2023-02-19 | Disposition: A | Discharge status: Home / Self Care | Payer: 59 | Attending: Emergency Medicine | Admitting: Emergency Medicine

## 2023-02-19 ENCOUNTER — Encounter (HOSPITAL_COMMUNITY): Payer: Self-pay

## 2023-02-19 ENCOUNTER — Emergency Department (HOSPITAL_COMMUNITY): Payer: 59

## 2023-02-19 DIAGNOSIS — I10 Essential (primary) hypertension: Secondary | ICD-10-CM | POA: Diagnosis not present

## 2023-02-19 DIAGNOSIS — R202 Paresthesia of skin: Secondary | ICD-10-CM | POA: Insufficient documentation

## 2023-02-19 DIAGNOSIS — I700 Atherosclerosis of aorta: Secondary | ICD-10-CM | POA: Insufficient documentation

## 2023-02-19 DIAGNOSIS — R200 Anesthesia of skin: Secondary | ICD-10-CM | POA: Insufficient documentation

## 2023-02-19 DIAGNOSIS — Z79899 Other long term (current) drug therapy: Secondary | ICD-10-CM | POA: Insufficient documentation

## 2023-02-19 DIAGNOSIS — R079 Chest pain, unspecified: Secondary | ICD-10-CM | POA: Diagnosis not present

## 2023-02-19 LAB — BASIC METABOLIC PANEL
Anion gap: 7 (ref 5–15)
BUN: 15 mg/dL (ref 6–20)
CO2: 23 mmol/L (ref 22–32)
Calcium: 8.8 mg/dL — ABNORMAL LOW (ref 8.9–10.3)
Chloride: 105 mmol/L (ref 98–111)
Creatinine, Ser: 0.94 mg/dL (ref 0.61–1.24)
GFR, Estimated: >60 mL/min (ref >60)
Glucose, Bld: 106 mg/dL — ABNORMAL HIGH (ref 70–99)
Potassium: 4 mmol/L (ref 3.5–5.1)
Sodium: 135 mmol/L (ref 135–145)

## 2023-02-19 LAB — CBC WITH DIFFERENTIAL/PLATELET
Abs Immature Granulocytes: 0.01 10*3/uL (ref 0.00–0.07)
Basophils Absolute: 0.1 10*3/uL (ref 0.0–0.1)
Basophils Relative: 1 %
Eosinophils Absolute: 0.1 10*3/uL (ref 0.0–0.5)
Eosinophils Relative: 3 %
HCT: 41 % (ref 39.0–52.0)
Hemoglobin: 14.1 g/dL (ref 13.0–17.0)
Immature Granulocytes: 0 %
Lymphocytes Relative: 37 %
Lymphs Abs: 1.6 10*3/uL (ref 0.7–4.0)
MCH: 31.8 pg (ref 26.0–34.0)
MCHC: 34.4 g/dL (ref 30.0–36.0)
MCV: 92.3 fL (ref 80.0–100.0)
Monocytes Absolute: 0.5 10*3/uL (ref 0.1–1.0)
Monocytes Relative: 11 %
Neutro Abs: 2 10*3/uL (ref 1.7–7.7)
Neutrophils Relative %: 48 %
Platelets: 203 10*3/uL (ref 150–400)
RBC: 4.44 MIL/uL (ref 4.22–5.81)
RDW: 14.2 % (ref 11.5–15.5)
WBC: 4.3 10*3/uL (ref 4.0–10.5)
nRBC: 0 % (ref 0.0–0.2)

## 2023-02-19 LAB — TROPONIN I (HIGH SENSITIVITY): Troponin I (High Sensitivity): 4 ng/L (ref <18)
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
## 2023-02-19 NOTE — Discharge Instructions (Addendum)
It was a pleasure taking care of you today.  As discussed, please call your neurosurgeon today for further evaluation of your left arm numbness/tingling.  I have placed a referral to cardiology.  Expect a phone call within 1 week to schedule an appointment.  Return to the ER for any worsening symptoms.

## 2023-02-19 NOTE — ED Triage Notes (Signed)
Pt presents with c/o left arm tingling for 5-7 days. Pt reports he feels like he has been experiencing spasms in that arm. Pt denies any chest pain.

## 2023-02-19 NOTE — ED Provider Notes (Signed)
[facility] Provider Note   CSN: [id number] Arrival date & time: 02/19/23  [id number]     History  Chief Complaint  Patient presents with   Arm tingling    Cory Benson is a 57 y.o. male with a past medical history significant for chronic low back pain status post lumbar fusion and history of cervical radiculopathy who presents to the ED due to numbness/tingling to left upper extremity x 1 week.  No recent injury.  Patient notes he is currently in physical therapy for his neck and left shoulder.  No history of IV drug use.  No fever or chills.  Denies left upper extremity weakness.  Patient states symptoms started as a "muscle spasm" in his left pectoral region and then radiates down left upper extremity which he describes as numbness/tingling.  No cardiac history.   History obtained from patient and past medical records. No interpreter used during encounter.       Home Medications Prior to Admission medications   Medication Sig Start Date End Date Taking? Authorizing Provider  atorvastatin (LIPITOR) 10 MG tablet TAKE 1 TABLET(10 MG) BY MOUTH DAILY 12/10/22   [name], [name], [name]  celecoxib (CELEBREX) 100 MG capsule TAKE 1 CAPSULE(100 MG) BY MOUTH TWICE DAILY 01/16/23   [name], [name], [name]  cyclobenzaprine (FLEXERIL) 10 MG tablet Take 1 tablet (10 mg total) by mouth 2 (two) times daily as needed for muscle spasms. 09/27/22   [name], [name], [name]  famotidine (PEPCID) 20 MG tablet TAKE 1 TABLET BY MOUTH DAILY 03/19/22   [name], [name], [name]  gabapentin (NEURONTIN) 800 MG tablet Take 1 tablet (800 mg total) by mouth 2 (two) times daily. 11/09/21   [name], [name], [name]  lidocaine (LIDODERM) 5 % Place 1 patch onto the skin every 12 (twelve) hours. Remove & Discard patch within 12 hours or as directed by MD 08/29/22   Rosemarie Ax, MD  losartan (COZAAR) 25 MG tablet Take 1 tablet (25 mg total) by mouth daily. 05/23/22   [name], [name], [name]   tadalafil (CIALIS) 20 MG tablet TAKE 1/2-1 TABLET BY MOUTH PROR TO SEX 12/10/22   [name], [name], [name]      Allergies    Patient has no known allergies.    Review of Systems   Review of Systems  Constitutional:  Negative for fever.  Cardiovascular:  Negative for chest pain.  Neurological:  Positive for numbness. Negative for weakness.    Physical Exam Updated Vital Signs BP (!) 152/118 (BP Location: Right Arm)   Pulse 86   Temp 98.8 F (37.1 C) (Oral)   Resp 18   SpO2 97%  Physical Exam Vitals and nursing note reviewed.  Constitutional:      General: He is not in acute distress.    Appearance: He is not ill-appearing.  HENT:     Head: Normocephalic.  Eyes:     Pupils: Pupils are equal, round, and reactive to light.  Neck:     Comments: No cervical midline tenderness.  Full range of motion of neck. Cardiovascular:     Rate and Rhythm: Normal rate and regular rhythm.     Pulses: Normal pulses.     Heart sounds: Normal heart sounds. No murmur heard.    No friction rub. No gallop.  Pulmonary:     Effort: Pulmonary effort is normal.     Breath sounds: Normal breath sounds.  Abdominal:     General: Abdomen is flat. There is no distension.  Palpations: Abdomen is soft.     Tenderness: There is no abdominal tenderness. There is no guarding or rebound.  Musculoskeletal:        General: Normal range of motion.     Cervical back: Neck supple.  Skin:    General: Skin is warm and dry.  Neurological:     General: No focal deficit present.     Mental Status: He is alert.     Comments: Equal grip strength.   Psychiatric:        Mood and Affect: Mood normal.        Behavior: Behavior normal.     ED Results / Procedures / Treatments   Labs (all labs ordered are listed, but only abnormal results are displayed) Labs Reviewed  BASIC METABOLIC PANEL - Abnormal; Notable for the following components:      Result Value   Glucose, Bld 106 (*)    Calcium 8.8 (*)    All  other components within normal limits  CBC WITH DIFFERENTIAL/PLATELET  TROPONIN I (HIGH SENSITIVITY)    EKG EKG Interpretation  Date/Time:  Wednesday February 19 2023 11:03:34 EDT Ventricular Rate:  55 PR Interval:  142 QRS Duration: 81 QT Interval:  399 QTC Calculation: 382 R Axis:   66 Text Interpretation: Sinus rhythm Probable left atrial enlargement Borderline T abnormalities, inferior leads Confirmed by Dene Gentry [phone]) on 02/19/2023 12:19:46 PM  Radiology DG Chest Portable 1 View  Result Date: 02/19/2023 CLINICAL DATA:  Chest pain. EXAM: PORTABLE CHEST 1 VIEW COMPARISON:  Chest CT January 11, 2022. FINDINGS: Tortuosity and calcific atherosclerotic disease of the aorta. Cardiomediastinal silhouette is normal. Mediastinal contours appear intact. There is no evidence of focal airspace consolidation, pleural effusion or pneumothorax. Osseous structures are without acute abnormality. Soft tissues are grossly normal. IMPRESSION: No active disease. Tortuosity and calcific atherosclerotic disease of the aorta. Electronically Signed   By: Fidela Salisbury M.D.   On: 02/19/2023 11:37    Procedures Procedures    Medications Ordered in ED Medications - No data to display  ED Course/ Medical Decision Making/ A&P                             Medical Decision Making Amount and/or Complexity of Data Reviewed Independent Historian: spouse External Data Reviewed: notes.    Details: Neurosurgery note Labs: ordered. Decision-making details documented in ED Course. Radiology: ordered and independent interpretation performed. Decision-making details documented in ED Course. ECG/medicine tests: ordered and independent interpretation performed. Decision-making details documented in ED Course.   This patient presents to the ED for concern of LUE numbness/tingling, this involves an extensive number of treatment options, and is a complaint that carries with it a high risk of complications  and morbidity.  The differential diagnosis includes cervical radiculopathy, ACS, infection, electrolyte derangement, etc  57 year old Male presents to the ED due to numbness/tingling to left upper extremity x 1 week.  Patient has a history of cervical radiculopathy.  Had a steroid injection on 11/01/2022.  No recent neck trauma.  Denies IV drug use.  No fever or chills.  Patient currently in physical therapy for neck and left shoulder.  No weakness.  Upon arrival, stable vitals.  Patient in no acute distress.  No cervical, thoracic, or lumbar midline tenderness.  Equal grip strength.  Low suspicion for central cord compression.  No infectious symptoms to suggest infectious etiology.  Given patient is having some discomfort  in left pectoral region will obtain cardiac labs to rule out atypical ACS.  Suspect symptoms related to cervical radiculopathy.  Will hold off on imaging at this time given reassuring physical exam.  Patient will likely need to follow-up with his neurosurgeon for further evaluation.  CBC unremarkable.  No leukocytosis.  Normal hemoglobin.  BMP reassuring.  Normal renal function.  No major electrolyte derangements.  Troponin normal.  EKG demonstrates normal sinus rhythm.  Nonspecific T wave abnormalities. Low suspicion for ACS. Chest x-ray personally reviewed and interpreted as negative for any acute abnormalities.  Does demonstrate tortuosity and calcified aorta. Patient made aware of findings and to follow-up with PCP for further evaluation. May need referral to cardiology.   Suspect numbness/tingling related to cervical radiculopathy. No weakness on exam.  Advised patient to call his neurosurgeon today for further evaluation.  Will refer to cardiology after discussion with patient per request due to calcified aorta.  Low suspicion for ACS, PE/DVT, or dissection causing patient's numbness/tingling of left arm. Patient stable for discharge. Strict ED precautions discussed with patient.  Patient states understanding and agrees to plan. Patient discharged home in no acute distress and stable vitals  Has PCP       Final Clinical Impression(s) / ED Diagnoses Final diagnoses:  Left upper extremity numbness  Calcification of aorta    Rx / DC Orders ED Discharge Orders          Ordered    Ambulatory referral to Cardiology       Comments: If you have not heard from the Cardiology office within the next 72 hours please call [phone].   02/19/23 1238              Suzy Bouchard, [name] 02/19/23 1241    Valarie Merino, MD 02/20/23 [phone]

## 2023-02-20 ENCOUNTER — Encounter: Payer: Self-pay | Admitting: Cardiology

## 2023-02-20 ENCOUNTER — Ambulatory Visit: Payer: 59 | Attending: Cardiovascular Disease | Admitting: Cardiology

## 2023-02-20 VITALS — BP 128/88 | HR 82 | Ht 73.0 in | Wt 191.8 lb

## 2023-02-20 DIAGNOSIS — I10 Essential (primary) hypertension: Secondary | ICD-10-CM | POA: Diagnosis not present

## 2023-02-20 DIAGNOSIS — I700 Atherosclerosis of aorta: Secondary | ICD-10-CM

## 2023-02-20 DIAGNOSIS — I2510 Atherosclerotic heart disease of native coronary artery without angina pectoris: Secondary | ICD-10-CM | POA: Diagnosis not present

## 2023-02-20 DIAGNOSIS — Z720 Tobacco use: Secondary | ICD-10-CM | POA: Diagnosis not present

## 2023-02-20 DIAGNOSIS — I2584 Coronary atherosclerosis due to calcified coronary lesion: Secondary | ICD-10-CM

## 2023-02-20 DIAGNOSIS — E7800 Pure hypercholesterolemia, unspecified: Secondary | ICD-10-CM

## 2023-02-20 MED ORDER — NICOTINE 21 MG/24HR TD PT24: 21.0000 mg | MEDICATED_PATCH | Freq: Every day | TRANSDERMAL | 11 refills | Status: DC

## 2023-02-20 NOTE — Patient Instructions (Addendum)
Medication Instructions:  Your physician has recommended you make the following change in your medication:  1) START using Nicoderm patches - 1 patch on the skin daily  *If you need a refill on your cardiac medications before your next appointment, please call your pharmacy*  Follow-Up: At [facility], you and your health needs are our priority.  As part of our continuing mission to provide you with exceptional heart care, we have created designated Provider Care Teams.  These Care Teams include your primary Cardiologist (physician) and Advanced Practice Providers (APPs -  Physician Assistants and Nurse Practitioners) who all work together to provide you with the care you need, when you need it.  Your next appointment:   6 month(s)  Provider:   Freada Bergeron, MD  or Nicholes Rough, [name], Melina Copa, [name], Ambrose Pancoast, NP, Ermalinda Barrios, [name], Christen Bame, NP, or Richardson Dopp, [name]

## 2023-02-23 ENCOUNTER — Other Ambulatory Visit: Payer: Self-pay | Admitting: Medical

## 2023-02-24 ENCOUNTER — Telehealth: Payer: Self-pay

## 2023-02-24 NOTE — Telephone Encounter (Signed)
        Patient  visited [facility] on 02/19/2023  for arm tingling.   Telephone encounter attempt :  2nd  A HIPAA compliant voice message was left requesting a return call.  Instructed patient to call back at [phone].   Ly Wass [facility]  [facility] Population Health Community Resource Care Guide   [email or web address]  ?? [phone]   Website: [email or web address]  [email or web address]

## 2023-02-24 NOTE — Telephone Encounter (Signed)
        Patient  visited [facility] on 02/19/2023  for arm tingling.   Telephone encounter attempt :  1st  A HIPAA compliant voice message was left requesting a return call.  Instructed patient to call back at [phone].   Stepfanie Yott [facility]  [facility] Population Health Community Resource Care Guide   [email or web address]  ?? [phone]   Website: [email or web address]  [email or web address]

## 2023-02-25 ENCOUNTER — Ambulatory Visit: Payer: 59 | Admitting: Medical

## 2023-03-03 ENCOUNTER — Other Ambulatory Visit: Payer: Self-pay

## 2023-03-03 ENCOUNTER — Ambulatory Visit (INDEPENDENT_AMBULATORY_CARE_PROVIDER_SITE_OTHER): Payer: 59 | Admitting: Family Medicine

## 2023-03-03 ENCOUNTER — Encounter: Payer: Self-pay | Admitting: Family Medicine

## 2023-03-03 ENCOUNTER — Encounter: Payer: Self-pay | Admitting: *Deleted

## 2023-03-03 VITALS — BP 126/80 | Ht 73.0 in | Wt 191.0 lb

## 2023-03-03 DIAGNOSIS — M1612 Unilateral primary osteoarthritis, left hip: Secondary | ICD-10-CM

## 2023-03-03 MED ORDER — TRIAMCINOLONE ACETONIDE 40 MG/ML IJ SUSP
40.0000 mg | Freq: Once | INTRAMUSCULAR | Status: AC
Start: 2023-03-03 — End: 2023-03-03
  Administered 2023-03-03: 40 mg via INTRA_ARTICULAR

## 2023-03-03 NOTE — Patient Instructions (Signed)
Good to see you ?Please use ice as needed  ?Please send me a message in MyChart with any questions or updates.  ?Please see me back in 3 months.  ? ?--Dr. Mercedez Boule ? ?
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
## 2023-03-03 NOTE — Assessment & Plan Note (Signed)
Acute on chronic in nature.  Exacerbation of his underlying degenerative changes. -Counseled on home exercise therapy and supportive care. - Injection today. -Could consider PRP injection

## 2023-03-04 DIAGNOSIS — M479 Spondylosis, unspecified: Secondary | ICD-10-CM | POA: Diagnosis not present

## 2023-03-04 DIAGNOSIS — M542 Cervicalgia: Secondary | ICD-10-CM | POA: Diagnosis not present

## 2023-03-04 DIAGNOSIS — M4802 Spinal stenosis, cervical region: Secondary | ICD-10-CM | POA: Diagnosis not present

## 2023-03-04 DIAGNOSIS — M5412 Radiculopathy, cervical region: Secondary | ICD-10-CM | POA: Diagnosis not present

## 2023-03-04 DIAGNOSIS — M5030 Other cervical disc degeneration, unspecified cervical region: Secondary | ICD-10-CM | POA: Diagnosis not present

## 2023-03-05 ENCOUNTER — Ambulatory Visit: Payer: 59 | Admitting: Medical

## 2023-03-11 ENCOUNTER — Ambulatory Visit: Payer: 59 | Admitting: Medical

## 2023-03-18 ENCOUNTER — Ambulatory Visit (INDEPENDENT_AMBULATORY_CARE_PROVIDER_SITE_OTHER): Payer: 59 | Admitting: Medical

## 2023-03-18 VITALS — BP 114/80 | HR 76 | Temp 98.0°F | Resp 18 | Ht 73.0 in | Wt 185.0 lb

## 2023-03-18 DIAGNOSIS — F17200 Nicotine dependence, unspecified, uncomplicated: Secondary | ICD-10-CM

## 2023-03-18 DIAGNOSIS — M5412 Radiculopathy, cervical region: Secondary | ICD-10-CM | POA: Diagnosis not present

## 2023-03-18 DIAGNOSIS — N528 Other male erectile dysfunction: Secondary | ICD-10-CM | POA: Diagnosis not present

## 2023-03-18 MED ORDER — NICOTINE 21 MG/24HR TD PT24: 21.0000 mg | MEDICATED_PATCH | Freq: Every day | TRANSDERMAL | 0 refills | Status: DC

## 2023-03-18 MED ORDER — GABAPENTIN 300 MG PO CAPS: 300.0000 mg | ORAL_CAPSULE | Freq: Every day | ORAL | 0 refills | Status: DC

## 2023-03-18 MED ORDER — TADALAFIL 20 MG PO TABS: ORAL_TABLET | ORAL | 3 refills | Status: DC

## 2023-03-18 NOTE — Patient Instructions (Addendum)
1. Cervical radicular pain Rx gabapentin 300 mg to use at night - Ambulatory referral to Neurosurgery  2. Smoker Rx nicotine patches  3.Erectile dysfunction Rx refill cialis.   4. Htn- bp well controlled on losartan.  Follow up in 2-3 weeks or sooner if needed.
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
## 2023-03-19 ENCOUNTER — Ambulatory Visit: Payer: 59 | Admitting: Medical

## 2023-03-28 DIAGNOSIS — M25512 Pain in left shoulder: Secondary | ICD-10-CM | POA: Diagnosis not present

## 2023-03-28 DIAGNOSIS — M5412 Radiculopathy, cervical region: Secondary | ICD-10-CM | POA: Diagnosis not present

## 2023-04-02 ENCOUNTER — Ambulatory Visit: Payer: 59 | Admitting: Cardiovascular Disease

## 2023-04-04 ENCOUNTER — Ambulatory Visit: Payer: 59 | Admitting: Medical

## 2023-04-05 ENCOUNTER — Other Ambulatory Visit: Payer: Self-pay | Admitting: Medical

## 2023-04-10 DIAGNOSIS — M4802 Spinal stenosis, cervical region: Secondary | ICD-10-CM | POA: Diagnosis not present

## 2023-04-10 DIAGNOSIS — M25512 Pain in left shoulder: Secondary | ICD-10-CM | POA: Diagnosis not present

## 2023-04-10 DIAGNOSIS — M47812 Spondylosis without myelopathy or radiculopathy, cervical region: Secondary | ICD-10-CM | POA: Diagnosis not present

## 2023-04-10 DIAGNOSIS — S46012A Strain of muscle(s) and tendon(s) of the rotator cuff of left shoulder, initial encounter: Secondary | ICD-10-CM | POA: Diagnosis not present

## 2023-04-10 DIAGNOSIS — M5412 Radiculopathy, cervical region: Secondary | ICD-10-CM | POA: Diagnosis not present

## 2023-04-15 ENCOUNTER — Telehealth: Payer: Self-pay | Admitting: Gastroenterology

## 2023-04-15 ENCOUNTER — Ambulatory Visit (INDEPENDENT_AMBULATORY_CARE_PROVIDER_SITE_OTHER): Payer: 59 | Admitting: Medical

## 2023-04-15 VITALS — BP 122/90 | HR 72 | Temp 98.0°F | Resp 18 | Ht 73.0 in | Wt 184.0 lb

## 2023-04-15 DIAGNOSIS — R634 Abnormal weight loss: Secondary | ICD-10-CM | POA: Diagnosis not present

## 2023-04-15 DIAGNOSIS — M542 Cervicalgia: Secondary | ICD-10-CM

## 2023-04-15 DIAGNOSIS — M25512 Pain in left shoulder: Secondary | ICD-10-CM

## 2023-04-15 DIAGNOSIS — M5410 Radiculopathy, site unspecified: Secondary | ICD-10-CM | POA: Diagnosis not present

## 2023-04-15 DIAGNOSIS — Z122 Encounter for screening for malignant neoplasm of respiratory organs: Secondary | ICD-10-CM

## 2023-04-15 DIAGNOSIS — I2510 Atherosclerotic heart disease of native coronary artery without angina pectoris: Secondary | ICD-10-CM

## 2023-04-15 NOTE — Patient Instructions (Addendum)
.   Radicular pain, shoulder pain and  Neck pain. Mri reports are not available. Reports will be available Friday after we called neurosurgeon office.    Screening for lung cancer You can go down stairs and get scheduled. - CT CHEST LUNG CA SCREEN LOW DOSE W/O CM; Future   Advise to call [facility] MD to clarify when you are due to repeat colonoscopy. [phone].  Weight loss. Try to see if you can gain wait. Goal 3-5 pounds. If you continue to lose would prompt need to work up further. Prior psa normal. Note no  reported abdomen pain, nausea or vomiting.   Follow up 2 weeks or sooner if needed.

## 2023-04-15 NOTE — Telephone Encounter (Signed)
Patient called requesting to speak with a nurse regarding weight loss.
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
## 2023-04-16 LAB — COMPREHENSIVE METABOLIC PANEL
ALT: 18 U/L (ref 0–53)
AST: 20 U/L (ref 0–37)
Albumin: 4.2 g/dL (ref 3.5–5.2)
Alkaline Phosphatase: 63 U/L (ref 39–117)
BUN: 16 mg/dL (ref 6–23)
CO2: 28 mEq/L (ref 19–32)
Calcium: 9.3 mg/dL (ref 8.4–10.5)
Chloride: 102 mEq/L (ref 96–112)
Creatinine, Ser: 1.1 mg/dL (ref 0.40–1.50)
GFR: 74.44 mL/min (ref >60.00)
Glucose, Bld: 84 mg/dL (ref 70–99)
Potassium: 4.3 mEq/L (ref 3.5–5.1)
Sodium: 135 mEq/L (ref 135–145)
Total Bilirubin: 0.5 mg/dL (ref 0.2–1.2)
Total Protein: 7.2 g/dL (ref 6.0–8.3)

## 2023-04-16 LAB — CBC WITH DIFFERENTIAL/PLATELET
Basophils Absolute: 0 10*3/uL (ref 0.0–0.1)
Basophils Relative: 0.8 % (ref 0.0–3.0)
Eosinophils Absolute: 0.1 10*3/uL (ref 0.0–0.7)
Eosinophils Relative: 2.2 % (ref 0.0–5.0)
HCT: 42.3 % (ref 39.0–52.0)
Hemoglobin: 14.3 g/dL (ref 13.0–17.0)
Lymphocytes Relative: 37.5 % (ref 12.0–46.0)
Lymphs Abs: 2 10*3/uL (ref 0.7–4.0)
MCHC: 33.9 g/dL (ref 30.0–36.0)
MCV: 93.4 fl (ref 78.0–100.0)
Monocytes Absolute: 0.5 10*3/uL (ref 0.1–1.0)
Monocytes Relative: 9.9 % (ref 3.0–12.0)
Neutro Abs: 2.7 10*3/uL (ref 1.4–7.7)
Neutrophils Relative %: 49.6 % (ref 43.0–77.0)
Platelets: 208 10*3/uL (ref 150.0–400.0)
RBC: 4.53 Mil/uL (ref 4.22–5.81)
RDW: 14.2 % (ref 11.5–15.5)
WBC: 5.4 10*3/uL (ref 4.0–10.5)

## 2023-04-16 LAB — LIPASE: Lipase: 28 U/L (ref 11.0–59.0)

## 2023-04-16 NOTE — Telephone Encounter (Signed)
Returned call to patient. Patient states that he has noticed that he has continued to lose weight. Pt was 191 lbs in April and he is now 184 lbs. Pt is concerned since he used to be about 205 lbs. I informed patient that based on his BMI he is at a healthy weight for his height. Patient wants to make sure nothing serious is wrong. He does not have any symptoms at all - no nausea,vomiting, abdominal pain, diarrhea, or rectal bleeding. Pt states that he was seen by PCP yesterday and they drew labs. PCP has asked that pt try to gain 3-5 lbs. Last colon was 2023, recall due in 2028. Pt is a cigarette smoker, no cannabis. Pt has an office visit scheduled in August with you. Please advise if you have any recommendations prior to his appt. Thanks

## 2023-04-16 NOTE — Telephone Encounter (Signed)
Called and spoke with patient regarding recommendation. Pt will keep f/u as scheduled. Pt verbalized understanding and had no concerns at the end of the call.

## 2023-04-18 DIAGNOSIS — M5412 Radiculopathy, cervical region: Secondary | ICD-10-CM | POA: Diagnosis not present

## 2023-04-25 ENCOUNTER — Telehealth: Payer: Self-pay | Admitting: Medical

## 2023-04-25 ENCOUNTER — Other Ambulatory Visit: Payer: Self-pay

## 2023-04-25 ENCOUNTER — Encounter (HOSPITAL_COMMUNITY): Payer: Self-pay

## 2023-04-25 ENCOUNTER — Ambulatory Visit (HOSPITAL_COMMUNITY)
Admission: RE | Admit: 2023-04-25 | Discharge: 2023-04-25 | Disposition: A | Discharge status: Home / Self Care | Payer: 59 | Source: Ambulatory Visit | Attending: Internal Medicine | Admitting: Internal Medicine

## 2023-04-25 ENCOUNTER — Ambulatory Visit (HOSPITAL_BASED_OUTPATIENT_CLINIC_OR_DEPARTMENT_OTHER)
Admission: RE | Admit: 2023-04-25 | Discharge: 2023-04-25 | Disposition: A | Discharge status: Home / Self Care | Payer: 59 | Source: Ambulatory Visit | Attending: Medical | Admitting: Medical

## 2023-04-25 VITALS — BP 142/99 | HR 68 | Temp 98.8°F | Resp 16

## 2023-04-25 DIAGNOSIS — F17210 Nicotine dependence, cigarettes, uncomplicated: Secondary | ICD-10-CM | POA: Diagnosis not present

## 2023-04-25 DIAGNOSIS — J439 Emphysema, unspecified: Secondary | ICD-10-CM | POA: Diagnosis not present

## 2023-04-25 DIAGNOSIS — M940 Chondrocostal junction syndrome [Tietze]: Secondary | ICD-10-CM | POA: Diagnosis not present

## 2023-04-25 DIAGNOSIS — M4802 Spinal stenosis, cervical region: Secondary | ICD-10-CM

## 2023-04-25 DIAGNOSIS — Z87891 Personal history of nicotine dependence: Secondary | ICD-10-CM | POA: Insufficient documentation

## 2023-04-25 DIAGNOSIS — I700 Atherosclerosis of aorta: Secondary | ICD-10-CM | POA: Insufficient documentation

## 2023-04-25 DIAGNOSIS — Z122 Encounter for screening for malignant neoplasm of respiratory organs: Secondary | ICD-10-CM | POA: Diagnosis not present

## 2023-04-25 MED ORDER — TIZANIDINE HCL 4 MG PO TABS: 4.0000 mg | ORAL_TABLET | Freq: Four times a day (QID) | ORAL | 0 refills | Status: DC | PRN

## 2023-04-25 MED ORDER — PREDNISONE 50 MG PO TABS: 50.0000 mg | ORAL_TABLET | Freq: Every day | ORAL | 0 refills | Status: DC

## 2023-04-25 NOTE — Telephone Encounter (Signed)
Patient has called stating he has had left chest pain since Wednesday and it is getting bad. The patient has a CT scan today and will see PCP Tuesday 6.11.24. Patient stated he will go to the emergency department sometime today following his appointment since it hurts for him to bend over.

## 2023-04-25 NOTE — Telephone Encounter (Signed)
noted 
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
## 2023-04-25 NOTE — ED Triage Notes (Signed)
Pt reports since Wed having central chest pains that is worse with movement and palpation. Hasn't taken any medication or ice or heat for pains.

## 2023-04-25 NOTE — Discharge Instructions (Addendum)
Your EKG is unremarkable. Your symptoms are most likely related to costochondritis, which is a painful inflammation of the cartilage between your ribs and your sternum. Please apply a warm moist compress, such as a microwavable heating pack, to your chest several times daily. Take the prednisone daily in the morning with breakfast.  If you develop worsening pain, fever, or any new symptoms, please return to the clinic.   Please talk with your PCP about possibly obtaining a CT calcium artery score to further assess the calcification of your aorta.  Stop taking your cyclobenzaprine.  Please switch to tizanidine.  You can take this up to 4 times daily.  This may make you drowsy so take with caution.  Please follow-up with the spine specialist regarding your neural foraminal stenosis and to further address the radicular symptoms down your left arm.

## 2023-04-29 ENCOUNTER — Ambulatory Visit: Payer: 59 | Admitting: Medical

## 2023-04-29 ENCOUNTER — Other Ambulatory Visit (HOSPITAL_BASED_OUTPATIENT_CLINIC_OR_DEPARTMENT_OTHER): Payer: Self-pay

## 2023-04-29 ENCOUNTER — Ambulatory Visit (INDEPENDENT_AMBULATORY_CARE_PROVIDER_SITE_OTHER): Payer: 59 | Admitting: Medical

## 2023-04-29 VITALS — BP 126/90 | HR 77 | Resp 18 | Ht 73.0 in | Wt 189.0 lb

## 2023-04-29 DIAGNOSIS — F17200 Nicotine dependence, unspecified, uncomplicated: Secondary | ICD-10-CM

## 2023-04-29 DIAGNOSIS — J449 Chronic obstructive pulmonary disease, unspecified: Secondary | ICD-10-CM

## 2023-04-29 DIAGNOSIS — G8929 Other chronic pain: Secondary | ICD-10-CM | POA: Diagnosis not present

## 2023-04-29 DIAGNOSIS — M25512 Pain in left shoulder: Secondary | ICD-10-CM

## 2023-04-29 MED ORDER — NICOTINE 21 MG/24HR TD PT24
21.0000 mg | MEDICATED_PATCH | Freq: Every day | TRANSDERMAL | 0 refills | Status: DC
  Filled 2023-04-29: qty 7, 7d supply, fill #0

## 2023-04-29 MED ORDER — TADALAFIL 20 MG PO TABS: ORAL_TABLET | ORAL | 3 refills | Status: DC
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
## 2023-04-29 NOTE — Addendum Note (Signed)
Addended by: Gwenevere Abbot on: 04/29/2023 12:51 PM   Modules accepted: Orders

## 2023-04-29 NOTE — Patient Instructions (Addendum)
1. Chronic left shoulder pain Placed referral. If they don't call you by a week let me know/ - Ambulatory referral to Sports Medicine  2. Chronic obstructive pulmonary disease, unspecified COPD type (HCC) Discussed your ct chest finding and smoking history. Failed wellbutrin. Rx nicotine patch. Decided to refer to pulmonologist as you request revferral due dad passing from copd. - Ambulatory referral to Pulmonology  3. Smoker Nicotine patch - Ambulatory referral to Pulmonology   4. ED. Rx Cialis sent.  Improved/resolved costochondritis.   Follow up 2 months or sooner or sooner if needed

## 2023-05-05 ENCOUNTER — Ambulatory Visit: Payer: 59

## 2023-05-06 ENCOUNTER — Other Ambulatory Visit: Payer: Self-pay

## 2023-05-06 ENCOUNTER — Ambulatory Visit: Payer: 59 | Attending: Neurosurgery

## 2023-05-06 ENCOUNTER — Ambulatory Visit: Payer: 59

## 2023-05-06 DIAGNOSIS — G8929 Other chronic pain: Secondary | ICD-10-CM | POA: Diagnosis not present

## 2023-05-06 DIAGNOSIS — M25512 Pain in left shoulder: Secondary | ICD-10-CM | POA: Insufficient documentation

## 2023-05-06 DIAGNOSIS — M5412 Radiculopathy, cervical region: Secondary | ICD-10-CM | POA: Diagnosis not present

## 2023-05-06 DIAGNOSIS — M6281 Muscle weakness (generalized): Secondary | ICD-10-CM | POA: Diagnosis not present
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
## 2023-05-12 ENCOUNTER — Other Ambulatory Visit (HOSPITAL_BASED_OUTPATIENT_CLINIC_OR_DEPARTMENT_OTHER): Payer: Self-pay

## 2023-05-13 ENCOUNTER — Telehealth: Payer: Self-pay | Admitting: Medical

## 2023-05-13 ENCOUNTER — Ambulatory Visit: Payer: 59 | Admitting: Physical Therapy

## 2023-05-13 MED ORDER — LOSARTAN POTASSIUM 25 MG PO TABS: 25.0000 mg | ORAL_TABLET | Freq: Every day | ORAL | 3 refills | Status: DC

## 2023-05-13 MED ORDER — GABAPENTIN 300 MG PO CAPS: 300.0000 mg | ORAL_CAPSULE | Freq: Every day | ORAL | 0 refills | Status: DC

## 2023-05-13 NOTE — Addendum Note (Signed)
Addended by: Maximino Sarin on: 05/13/2023 10:45 AM   Modules accepted: Orders

## 2023-05-13 NOTE — Telephone Encounter (Signed)
Rx sent 

## 2023-05-13 NOTE — Telephone Encounter (Signed)
Prescription Request  05/13/2023  Is this a "Controlled Substance" medicine? Yes  LOV: 04/29/2023  What is the name of the medication or equipment?   gabapentin (NEURONTIN) 300 MG capsule [[id number]]   losartan (COZAAR) 25 MG tablet [[id number]]   Have you contacted your pharmacy to request a refill? No   Which pharmacy would you like this sent to?   [facility] [phone] - [place], [place] - [address] AT [facility] OF [address] [place] [address] Phone: [phone] Fax: [phone]  Patient notified that their request is being sent to the clinical staff for review and that they should receive a response within 2 business days.   Please advise at Mobile [phone] (mobile)

## 2023-05-14 ENCOUNTER — Encounter: Payer: Self-pay | Admitting: Physical Therapy

## 2023-05-14 ENCOUNTER — Ambulatory Visit: Payer: 59 | Admitting: Physical Therapy

## 2023-05-14 DIAGNOSIS — M25512 Pain in left shoulder: Secondary | ICD-10-CM | POA: Diagnosis not present

## 2023-05-14 DIAGNOSIS — M5412 Radiculopathy, cervical region: Secondary | ICD-10-CM | POA: Diagnosis not present

## 2023-05-14 DIAGNOSIS — M6281 Muscle weakness (generalized): Secondary | ICD-10-CM

## 2023-05-14 DIAGNOSIS — G8929 Other chronic pain: Secondary | ICD-10-CM | POA: Diagnosis not present
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
## 2023-05-17 ENCOUNTER — Ambulatory Visit: Payer: 59

## 2023-05-17 DIAGNOSIS — M6281 Muscle weakness (generalized): Secondary | ICD-10-CM

## 2023-05-17 DIAGNOSIS — G8929 Other chronic pain: Secondary | ICD-10-CM | POA: Diagnosis not present

## 2023-05-17 DIAGNOSIS — M5412 Radiculopathy, cervical region: Secondary | ICD-10-CM | POA: Diagnosis not present

## 2023-05-17 DIAGNOSIS — M25512 Pain in left shoulder: Secondary | ICD-10-CM | POA: Diagnosis not present
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
## 2023-05-19 ENCOUNTER — Ambulatory Visit: Payer: 59 | Attending: Neurosurgery

## 2023-05-19 DIAGNOSIS — M5412 Radiculopathy, cervical region: Secondary | ICD-10-CM | POA: Diagnosis not present

## 2023-05-19 DIAGNOSIS — M25512 Pain in left shoulder: Secondary | ICD-10-CM | POA: Insufficient documentation

## 2023-05-19 DIAGNOSIS — M6281 Muscle weakness (generalized): Secondary | ICD-10-CM | POA: Diagnosis not present

## 2023-05-19 DIAGNOSIS — G8929 Other chronic pain: Secondary | ICD-10-CM | POA: Diagnosis not present
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
## 2023-05-20 ENCOUNTER — Ambulatory Visit (INDEPENDENT_AMBULATORY_CARE_PROVIDER_SITE_OTHER): Payer: 59 | Admitting: Sports Medicine

## 2023-05-20 VITALS — BP 136/86 | Ht 72.0 in | Wt 190.0 lb

## 2023-05-20 DIAGNOSIS — M67912 Unspecified disorder of synovium and tendon, left shoulder: Secondary | ICD-10-CM | POA: Diagnosis not present

## 2023-05-20 DIAGNOSIS — M4722 Other spondylosis with radiculopathy, cervical region: Secondary | ICD-10-CM

## 2023-05-20 MED ORDER — NITROGLYCERIN 0.2 MG/HR TD PT24: MEDICATED_PATCH | TRANSDERMAL | 1 refills | Status: DC

## 2023-05-20 NOTE — Patient Instructions (Signed)

## 2023-05-20 NOTE — Assessment & Plan Note (Signed)
We will plan to start him on a series rotator cuff exercises He will use a light weight with these Since he started with 5 pounds he will not do elevation but only go to 90 degrees until that is very comfortable We will start him on a nitroglycerin protocol Recheck him in 4 to 6 weeks

## 2023-05-20 NOTE — Progress Notes (Signed)
Chief complaint left shoulder pain  Patient has had significant radicular symptoms from his cervical spine They go down his left arm all the way to his hand An MRI of his cervical spine in October 2023 showed significant degenerative and arthritic changes along with narrowing of his foramen at throughout his cervical spine  Over the last few months he has been having more left shoulder pain This is worse when the neck flares He is an active guy who used to do a lot of sports and has had some periodic shoulder injuries Now it hurts him to lift his left arm over his head He has been seen at sports medicine and told he may have a rotator cuff tear on the left  He comes for advice regarding treatment  Physical exam Pleasant black male in no acute BP 136/86   Ht 6' (1.829 m)   Wt 190 lb (86.2 kg)   BMI 25.77 kg/m   Shoulder: Inspection reveals no abnormalities, atrophy or asymmetry. Palpation is normal with no tenderness over AC joint or bicipital groove. ROM is full in all planes. Rotator cuff strength weak with resisted elevation/ Pain with resisted ER + signs of impingement with Neer and Hawkin's tests, empty can. Speeds and Yergason's tests normal. No labral pathology noted with negative Obrien's, negative clunk and good stability. Normal scapular function observed. No painful arc and no drop arm sign. No apprehension sign  Neck range of motion shows some limited extension Rotation or lateral bending can cause some tingling down into his arm

## 2023-05-20 NOTE — Assessment & Plan Note (Signed)
I think with his current radicular symptoms I would like to take him back to 800 mg of gabapentin at nighttime.  He is going to try those tablets and we will continue this if he is doing well He may need daytime doses if he keeps having symptoms

## 2023-05-27 ENCOUNTER — Ambulatory Visit: Payer: 59

## 2023-05-27 DIAGNOSIS — M6281 Muscle weakness (generalized): Secondary | ICD-10-CM

## 2023-05-27 DIAGNOSIS — M25512 Pain in left shoulder: Secondary | ICD-10-CM | POA: Diagnosis not present

## 2023-05-27 DIAGNOSIS — M5412 Radiculopathy, cervical region: Secondary | ICD-10-CM | POA: Diagnosis not present

## 2023-05-27 DIAGNOSIS — G8929 Other chronic pain: Secondary | ICD-10-CM | POA: Diagnosis not present
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
## 2023-05-29 ENCOUNTER — Ambulatory Visit: Payer: 59

## 2023-05-29 DIAGNOSIS — M25512 Pain in left shoulder: Secondary | ICD-10-CM | POA: Diagnosis not present

## 2023-05-29 DIAGNOSIS — M5412 Radiculopathy, cervical region: Secondary | ICD-10-CM | POA: Diagnosis not present

## 2023-05-29 DIAGNOSIS — G8929 Other chronic pain: Secondary | ICD-10-CM | POA: Diagnosis not present

## 2023-05-29 DIAGNOSIS — M6281 Muscle weakness (generalized): Secondary | ICD-10-CM
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
## 2023-06-03 ENCOUNTER — Encounter: Payer: Self-pay | Admitting: Physician Assistant

## 2023-06-03 ENCOUNTER — Ambulatory Visit: Payer: 59 | Attending: Physician Assistant | Admitting: Physician Assistant

## 2023-06-03 VITALS — BP 122/86 | HR 85 | Ht 72.0 in | Wt 184.0 lb

## 2023-06-03 DIAGNOSIS — Z720 Tobacco use: Secondary | ICD-10-CM | POA: Diagnosis not present

## 2023-06-03 DIAGNOSIS — E785 Hyperlipidemia, unspecified: Secondary | ICD-10-CM

## 2023-06-03 DIAGNOSIS — I10 Essential (primary) hypertension: Secondary | ICD-10-CM

## 2023-06-03 DIAGNOSIS — I2510 Atherosclerotic heart disease of native coronary artery without angina pectoris: Secondary | ICD-10-CM | POA: Diagnosis not present

## 2023-06-03 DIAGNOSIS — I2584 Coronary atherosclerosis due to calcified coronary lesion: Secondary | ICD-10-CM | POA: Diagnosis not present

## 2023-06-03 DIAGNOSIS — I700 Atherosclerosis of aorta: Secondary | ICD-10-CM | POA: Diagnosis not present

## 2023-06-03 NOTE — Patient Instructions (Signed)
Medication Instructions:   Your physician recommends that you continue on your current medications as directed. Please refer to the Current Medication list given to you today.  *If you need a refill on your cardiac medications before your next appointment, please call your pharmacy*   Lab Work:  MAKE SURE YOU GET LABS LIVER AND CHOLESTEROL  FROM PRIMARY PROVIDER  NEXT     If you have labs (blood work) drawn today and your tests are completely normal, you will receive your results only by: MyChart Message (if you have MyChart) OR A paper copy in the mail If you have any lab test that is abnormal or we need to change your treatment, we will call you to review the results.   Testing/Procedures: NONE ORDERED  TODAY    Follow-Up: At [facility], you and your health needs are our priority.  As part of our continuing mission to provide you with exceptional heart care, we have created designated Provider Care Teams.  These Care Teams include your primary Cardiologist (physician) and Advanced Practice Providers (APPs -  Physician Assistants and Nurse Practitioners) who all work together to provide you with the care you need, when you need it.  We recommend signing up for the patient portal called "MyChart".  Sign up information is provided on this After Visit Summary.  MyChart is used to connect with patients for Virtual Visits (Telemedicine).  Patients are able to view lab/test results, encounter notes, upcoming appointments, etc.  Non-urgent messages can be sent to your provider as well.   To learn more about what you can do with MyChart, go to [email or web address].    Your next appointment:    1 year(s)  Provider:   DR Lynnette Caffey    Other Instructions  Low-Sodium Eating Plan Salt (sodium) helps you keep a healthy balance of fluids in your body. Too much sodium can raise your blood pressure. It can also cause fluid and waste to be held in your body. Your health care provider  or dietitian may recommend a low-sodium eating plan if you have high blood pressure (hypertension), kidney disease, liver disease, or heart failure. Eating less sodium can help lower your blood pressure and reduce swelling. It can also protect your heart, liver, and kidneys. What are tips for following this plan? Reading food labels  Check food labels for the amount of sodium per serving. If you eat more than one serving, you must multiply the listed amount by the number of servings. Choose foods with less than 140 milligrams (mg) of sodium per serving. Avoid foods with 300 mg of sodium or more per serving. Always check how much sodium is in a product, even if the label says "unsalted" or "no salt added." Shopping  Buy products labeled as "low-sodium" or "no salt added." Buy fresh foods. Avoid canned foods and pre-made or frozen meals. Avoid canned, cured, or processed meats. Buy breads that have less than 80 mg of sodium per slice. Cooking  Eat more home-cooked food. Try to eat less restaurant, buffet, and fast food. Try not to add salt when you cook. Use salt-free seasonings or herbs instead of table salt or sea salt. Check with your provider or pharmacist before using salt substitutes. Cook with plant-based oils, such as canola, sunflower, or olive oil. Meal planning When eating at a restaurant, ask if your food can be made with less salt or no salt. Avoid dishes labeled as brined, pickled, cured, or smoked. Avoid dishes made with soy sauce,  miso, or teriyaki sauce. Avoid foods that have monosodium glutamate (MSG) in them. MSG may be added to some restaurant food, sauces, soups, bouillon, and canned foods. Make meals that can be grilled, baked, poached, roasted, or steamed. These are often made with less sodium. General information Try to limit your sodium intake to 1,500-2,300 mg each day, or the amount told by your provider. What foods should I eat? Fruits Fresh, frozen, or canned  fruit. Fruit juice. Vegetables Fresh or frozen vegetables. "No salt added" canned vegetables. "No salt added" tomato sauce and paste. Low-sodium or reduced-sodium tomato and vegetable juice. Grains Low-sodium cereals, such as oats, puffed wheat and rice, and shredded wheat. Low-sodium crackers. Unsalted rice. Unsalted pasta. Low-sodium bread. Whole grain breads and whole grain pasta. Meats and other proteins Fresh or frozen meat, poultry, seafood, and fish. These should have no added salt. Low-sodium canned tuna and salmon. Unsalted nuts. Dried peas, beans, and lentils without added salt. Unsalted canned beans. Eggs. Unsalted nut butters. Dairy Milk. Soy milk. Cheese that is naturally low in sodium, such as ricotta cheese, fresh mozzarella, or Swiss cheese. Low-sodium or reduced-sodium cheese. Cream cheese. Yogurt. Seasonings and condiments Fresh and dried herbs and spices. Salt-free seasonings. Low-sodium mustard and ketchup. Sodium-free salad dressing. Sodium-free light mayonnaise. Fresh or refrigerated horseradish. Lemon juice. Vinegar. Other foods Homemade, reduced-sodium, or low-sodium soups. Unsalted popcorn and pretzels. Low-salt or salt-free chips. The items listed above may not be all the foods and drinks you can have. Talk to a dietitian to learn more. What foods should I avoid? Vegetables Sauerkraut, pickled vegetables, and relishes. Olives. [place] fries. Onion rings. Regular canned vegetables, except low-sodium or reduced-sodium items. Regular canned tomato sauce and paste. Regular tomato and vegetable juice. Frozen vegetables in sauces. Grains Instant hot cereals. Bread stuffing, pancake, and biscuit mixes. Croutons. Seasoned rice or pasta mixes. Noodle soup cups. Boxed or frozen macaroni and cheese. Regular salted crackers. Self-rising flour. Meats and other proteins Meat or fish that is salted, canned, smoked, spiced, or pickled. Precooked or cured meat, such as sausages or meat  loaves. Tomasa Blase. Ham. Pepperoni. Hot dogs. Corned beef. Chipped beef. Salt pork. Jerky. Pickled herring, anchovies, and sardines. Regular canned tuna. Salted nuts. Dairy Processed cheese and cheese spreads. Hard cheeses. Cheese curds. Blue cheese. Feta cheese. String cheese. Regular cottage cheese. Buttermilk. Canned milk. Fats and oils Salted butter. Regular margarine. Ghee. Bacon fat. Seasonings and condiments Onion salt, garlic salt, seasoned salt, table salt, and sea salt. Canned and packaged gravies. Worcestershire sauce. Tartar sauce. Barbecue sauce. Teriyaki sauce. Soy sauce, including reduced-sodium soy sauce. Steak sauce. Fish sauce. Oyster sauce. Cocktail sauce. Horseradish that you find on the shelf. Regular ketchup and mustard. Meat flavorings and tenderizers. Bouillon cubes. Hot sauce. Pre-made or packaged marinades. Pre-made or packaged taco seasonings. Relishes. Regular salad dressings. Salsa. Other foods Salted popcorn and pretzels. Corn chips and puffs. Potato and tortilla chips. Canned or dried soups. Pizza. Frozen entrees and pot pies. The items listed above may not be all the foods and drinks you should avoid. Talk to a dietitian to learn more. This information is not intended to replace advice given to you by your health care provider. Make sure you discuss any questions you have with your health care provider. Document Revised: 11/21/2022 Document Reviewed: 11/21/2022 Elsevier Patient Education  2024 [employer]  Heart-Healthy Eating Plan Eating a healthy diet is important for the health of your heart. A heart-healthy eating plan includes: Eating less unhealthy fats. Eating more  healthy fats. Eating less salt in your food. Salt is also called sodium. Making other changes in your diet. Talk with your doctor or a diet specialist (dietitian) to create an eating plan that is right for you. What is my plan? Your doctor may recommend an eating plan that includes: Total fat:  ______% or less of total calories a day. Saturated fat: ______% or less of total calories a day. Cholesterol: less than _________mg a day. Sodium: less than _________mg a day. What are tips for following this plan? Cooking Avoid frying your food. Try to bake, boil, grill, or broil it instead. You can also reduce fat by: Removing the skin from poultry. Removing all visible fats from meats. Steaming vegetables in water or broth. Meal planning  At meals, divide your plate into four equal parts: Fill one-half of your plate with vegetables and green salads. Fill one-fourth of your plate with whole grains. Fill one-fourth of your plate with lean protein foods. Eat 2-4 cups of vegetables per day. One cup of vegetables is: 1 cup (91 g) broccoli or cauliflower florets. 2 medium carrots. 1 large bell pepper. 1 large sweet potato. 1 large tomato. 1 medium white potato. 2 cups (150 g) raw leafy greens. Eat 1-2 cups of fruit per day. One cup of fruit is: 1 small apple 1 large banana 1 cup (237 g) mixed fruit, 1 large orange,  cup (82 g) dried fruit, 1 cup (240 mL) 100% fruit juice. Eat more foods that have soluble fiber. These are apples, broccoli, carrots, beans, peas, and barley. Try to get 20-30 g of fiber per day. Eat 4-5 servings of nuts, legumes, and seeds per week: 1 serving of dried beans or legumes equals  cup (90 g) cooked. 1 serving of nuts is  oz (12 almonds, 24 pistachios, or 7 walnut halves). 1 serving of seeds equals  oz (8 g). General information Eat more home-cooked food. Eat less restaurant, buffet, and fast food. Limit or avoid alcohol. Limit foods that are high in starch and sugar. Avoid fried foods. Lose weight if you are overweight. Keep track of how much salt (sodium) you eat. This is important if you have high blood pressure. Ask your doctor to tell you more about this. Try to add vegetarian meals each week. Fats Choose healthy fats. These include olive  oil and canola oil, flaxseeds, walnuts, almonds, and seeds. Eat more omega-3 fats. These include salmon, mackerel, sardines, tuna, flaxseed oil, and ground flaxseeds. Try to eat fish at least 2 times each week. Check food labels. Avoid foods with trans fats or high amounts of saturated fat. Limit saturated fats. These are often found in animal products, such as meats, butter, and cream. These are also found in plant foods, such as palm oil, palm kernel oil, and coconut oil. Avoid foods with partially hydrogenated oils in them. These have trans fats. Examples are stick margarine, some tub margarines, cookies, crackers, and other baked goods. What foods should I eat? Fruits All fresh, canned (in natural juice), or frozen fruits. Vegetables Fresh or frozen vegetables (raw, steamed, roasted, or grilled). Green salads. Grains Most grains. Choose whole wheat and whole grains most of the time. Rice and pasta, including [place] rice and pastas made with whole wheat. Meats and other proteins Lean, well-trimmed beef, veal, pork, and lamb. Chicken and [place] without skin. All fish and shellfish. Wild duck, rabbit, pheasant, and venison. Egg whites or low-cholesterol egg substitutes. Dried beans, peas, lentils, and tofu. Seeds and most  nuts. Dairy Low-fat or nonfat cheeses, including ricotta and mozzarella. Skim or 1% milk that is liquid, powdered, or evaporated. Buttermilk that is made with low-fat milk. Nonfat or low-fat yogurt. Fats and oils Non-hydrogenated (trans-free) margarines. Vegetable oils, including soybean, sesame, sunflower, olive, peanut, safflower, corn, canola, and cottonseed. Salad dressings or mayonnaise made with a vegetable oil. Beverages Mineral water. Coffee and tea. Diet carbonated beverages. Sweets and desserts Sherbet, gelatin, and fruit ice. Small amounts of dark chocolate. Limit all sweets and desserts. Seasonings and condiments All seasonings and condiments. The items listed  above may not be a complete list of foods and drinks you can eat. Contact a dietitian for more options. What foods should I avoid? Fruits Canned fruit in heavy syrup. Fruit in cream or butter sauce. Fried fruit. Limit coconut. Vegetables Vegetables cooked in cheese, cream, or butter sauce. Fried vegetables. Grains Breads that are made with saturated or trans fats, oils, or whole milk. Croissants. Sweet rolls. Donuts. High-fat crackers, such as cheese crackers. Meats and other proteins Fatty meats, such as hot dogs, ribs, sausage, bacon, rib-eye roast or steak. High-fat deli meats, such as salami and bologna. Caviar. Domestic duck and goose. Organ meats, such as liver. Dairy Cream, sour cream, cream cheese, and creamed cottage cheese. Whole-milk cheeses. Whole or 2% milk that is liquid, evaporated, or condensed. Whole buttermilk. Cream sauce or high-fat cheese sauce. Yogurt that is made from whole milk. Fats and oils Meat fat, or shortening. Cocoa butter, hydrogenated oils, palm oil, coconut oil, palm kernel oil. Solid fats and shortenings, including bacon fat, salt pork, lard, and butter. Nondairy cream substitutes. Salad dressings with cheese or sour cream. Beverages Regular sodas and juice drinks with added sugar. Sweets and desserts Frosting. Pudding. Cookies. Cakes. Pies. Milk chocolate or white chocolate. Buttered syrups. Full-fat ice cream or ice cream drinks. The items listed above may not be a complete list of foods and drinks to avoid. Contact a dietitian for more information. Summary Heart-healthy meal planning includes eating less unhealthy fats, eating more healthy fats, and making other changes in your diet. Eat a balanced diet. This includes fruits and vegetables, low-fat or nonfat dairy, lean protein, nuts and legumes, whole grains, and heart-healthy oils and fats. This information is not intended to replace advice given to you by your health care provider. Make sure you discuss  any questions you have with your health care provider. Document Revised: 12/10/2021 Document Reviewed: 12/10/2021 Elsevier Patient Education  2024 [employer].
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
## 2023-06-04 ENCOUNTER — Ambulatory Visit: Payer: 59

## 2023-06-04 DIAGNOSIS — M25512 Pain in left shoulder: Secondary | ICD-10-CM | POA: Diagnosis not present

## 2023-06-04 DIAGNOSIS — M6281 Muscle weakness (generalized): Secondary | ICD-10-CM | POA: Diagnosis not present

## 2023-06-04 DIAGNOSIS — M5412 Radiculopathy, cervical region: Secondary | ICD-10-CM

## 2023-06-04 DIAGNOSIS — G8929 Other chronic pain: Secondary | ICD-10-CM
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
## 2023-06-07 ENCOUNTER — Ambulatory Visit: Payer: 59

## 2023-06-07 DIAGNOSIS — M5412 Radiculopathy, cervical region: Secondary | ICD-10-CM

## 2023-06-07 DIAGNOSIS — M25512 Pain in left shoulder: Secondary | ICD-10-CM | POA: Diagnosis not present

## 2023-06-07 DIAGNOSIS — G8929 Other chronic pain: Secondary | ICD-10-CM

## 2023-06-07 DIAGNOSIS — M6281 Muscle weakness (generalized): Secondary | ICD-10-CM | POA: Diagnosis not present
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
## 2023-06-09 ENCOUNTER — Other Ambulatory Visit: Payer: Self-pay | Admitting: Medical

## 2023-06-10 ENCOUNTER — Ambulatory Visit: Payer: 59

## 2023-06-10 DIAGNOSIS — M5412 Radiculopathy, cervical region: Secondary | ICD-10-CM | POA: Diagnosis not present

## 2023-06-10 DIAGNOSIS — G8929 Other chronic pain: Secondary | ICD-10-CM

## 2023-06-10 DIAGNOSIS — M6281 Muscle weakness (generalized): Secondary | ICD-10-CM | POA: Diagnosis not present

## 2023-06-10 DIAGNOSIS — M25512 Pain in left shoulder: Secondary | ICD-10-CM | POA: Diagnosis not present
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
## 2023-06-11 ENCOUNTER — Other Ambulatory Visit: Payer: Self-pay | Admitting: Medical

## 2023-06-12 ENCOUNTER — Ambulatory Visit: Payer: 59

## 2023-06-17 ENCOUNTER — Ambulatory Visit: Payer: 59

## 2023-06-17 DIAGNOSIS — M5412 Radiculopathy, cervical region: Secondary | ICD-10-CM

## 2023-06-17 DIAGNOSIS — G8929 Other chronic pain: Secondary | ICD-10-CM | POA: Diagnosis not present

## 2023-06-17 DIAGNOSIS — M6281 Muscle weakness (generalized): Secondary | ICD-10-CM | POA: Diagnosis not present

## 2023-06-17 DIAGNOSIS — M25512 Pain in left shoulder: Secondary | ICD-10-CM | POA: Diagnosis not present
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
## 2023-06-19 ENCOUNTER — Ambulatory Visit: Payer: 59 | Attending: Neurosurgery

## 2023-06-19 DIAGNOSIS — M6281 Muscle weakness (generalized): Secondary | ICD-10-CM | POA: Insufficient documentation

## 2023-06-19 DIAGNOSIS — M5412 Radiculopathy, cervical region: Secondary | ICD-10-CM | POA: Diagnosis not present

## 2023-06-19 DIAGNOSIS — G8929 Other chronic pain: Secondary | ICD-10-CM | POA: Insufficient documentation

## 2023-06-19 DIAGNOSIS — M25512 Pain in left shoulder: Secondary | ICD-10-CM | POA: Insufficient documentation
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
## 2023-06-24 ENCOUNTER — Ambulatory Visit: Payer: 59

## 2023-06-24 DIAGNOSIS — M5412 Radiculopathy, cervical region: Secondary | ICD-10-CM

## 2023-06-24 DIAGNOSIS — G8929 Other chronic pain: Secondary | ICD-10-CM | POA: Diagnosis not present

## 2023-06-24 DIAGNOSIS — M6281 Muscle weakness (generalized): Secondary | ICD-10-CM | POA: Diagnosis not present

## 2023-06-24 DIAGNOSIS — M25512 Pain in left shoulder: Secondary | ICD-10-CM | POA: Diagnosis not present
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
## 2023-06-26 ENCOUNTER — Ambulatory Visit: Payer: 59

## 2023-06-26 ENCOUNTER — Telehealth: Payer: Self-pay
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
## 2023-06-30 ENCOUNTER — Telehealth: Payer: Self-pay | Admitting: Medical

## 2023-06-30 ENCOUNTER — Ambulatory Visit: Payer: 59 | Admitting: Medical

## 2023-06-30 MED ORDER — TADALAFIL 20 MG PO TABS: ORAL_TABLET | ORAL | 3 refills | Status: DC

## 2023-06-30 NOTE — Telephone Encounter (Signed)
Prescription Request  06/30/2023  Is this a "Controlled Substance" medicine? No  LOV: 04/29/2023  What is the name of the medication or equipment? tadalafil (CIALIS) 20 MG tablet [[id number]  Have you contacted your pharmacy to request a refill? No   Which pharmacy would you like this sent to?    [facility] [address] [place], [place] - [address], [place] [address] Phone: [phone]  Patient notified that their request is being sent to the clinical staff for review and that they should receive a response within 2 business days.   Please advise at [facility] [phone]

## 2023-06-30 NOTE — Telephone Encounter (Signed)
Rx sent 

## 2023-06-30 NOTE — Addendum Note (Signed)
Addended by: Maximino Sarin on: 06/30/2023 08:39 AM   Modules accepted: Orders

## 2023-07-08 ENCOUNTER — Ambulatory Visit: Payer: 59 | Admitting: Gastroenterology

## 2023-07-08 ENCOUNTER — Telehealth: Payer: Self-pay

## 2023-07-08 NOTE — Telephone Encounter (Signed)
Called and left message to come at 1:30 PM for appt on 07/09/23

## 2023-07-09 ENCOUNTER — Encounter: Payer: Self-pay | Admitting: Pulmonary Disease

## 2023-07-09 ENCOUNTER — Ambulatory Visit (INDEPENDENT_AMBULATORY_CARE_PROVIDER_SITE_OTHER): Payer: 59 | Admitting: Pulmonary Disease

## 2023-07-09 VITALS — BP 150/110 | HR 88 | Ht 72.0 in | Wt 184.0 lb

## 2023-07-09 DIAGNOSIS — J432 Centrilobular emphysema: Secondary | ICD-10-CM | POA: Diagnosis not present

## 2023-07-09 DIAGNOSIS — F17200 Nicotine dependence, unspecified, uncomplicated: Secondary | ICD-10-CM | POA: Diagnosis not present

## 2023-07-09 NOTE — Patient Instructions (Signed)
Thank you for visiting Dr. Tonia Brooms at [facility]. Today we recommend the following:  Orders Placed This Encounter  Procedures   Pulmonary Function Test   Return in about 3 months (around 10/09/2023) for with APP or Dr. Tonia Brooms, after PFTs.    Please do your part to reduce the spread of COVID-19.
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
## 2023-07-09 NOTE — Progress Notes (Signed)
Smoking Cessation Counseling:   The patient's current tobacco use: 30 years, 0.5 ppd  The patient was advised to quit and impact of smoking on their health.  I assessed the patient's willingness to attempt to quit. I provided methods and skills for cessation. We reviewed medication management of smoking session drugs if appropriate. Resources to help quit smoking were provided. A smoking cessation quit date was set: October 2nd his bday  Follow-up was arranged in our clinic.  The amount of time spent counseling patient was 4 mins   Josephine Igo, DO [facility] 07/09/2023 2:16 PM

## 2023-07-10 ENCOUNTER — Other Ambulatory Visit: Payer: Self-pay

## 2023-07-10 ENCOUNTER — Ambulatory Visit (INDEPENDENT_AMBULATORY_CARE_PROVIDER_SITE_OTHER): Payer: 59 | Admitting: Sports Medicine

## 2023-07-10 VITALS — BP 120/84 | Ht 72.0 in | Wt 185.0 lb

## 2023-07-10 DIAGNOSIS — M67912 Unspecified disorder of synovium and tendon, left shoulder: Secondary | ICD-10-CM | POA: Diagnosis not present

## 2023-07-10 DIAGNOSIS — M1612 Unilateral primary osteoarthritis, left hip: Secondary | ICD-10-CM

## 2023-07-10 MED ORDER — METHYLPREDNISOLONE ACETATE 40 MG/ML IJ SUSP
40.0000 mg | Freq: Once | INTRAMUSCULAR | Status: AC
  Administered 2023-07-10: 40 mg via INTRA_ARTICULAR
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
## 2023-07-10 NOTE — Assessment & Plan Note (Signed)
Procedure:  Injection of Consent obtained and verified. Time-out conducted. Noted no overlying erythema, induration, or other signs of local infection. Skin prepped in a sterile fashion. Topical analgesic spray: Ethyl chloride. Completed without difficulty.  We used [place] of left hip to guide injection into the capsule at the fermoral neck of left hip Meds: 1 cc Solumedrol + 4 ccs lidocaine 1% Advised to call if fevers/chills, erythema, induration, drainage, or persistent bleeding.  Completed by Dr Darrick Penna with assistance of Dr Claudean Severance  We will continue intermittent CSI every 3 to 4 mos if needed  US shows irregularity of femoral head and effusion in capsule

## 2023-07-10 NOTE — Assessment & Plan Note (Signed)
He is making good progress with HEP and NTG We will plan to continue this as exam today showed excellent strength Rx at least 6 more weeks

## 2023-07-12 ENCOUNTER — Ambulatory Visit: Payer: 59

## 2023-07-14 ENCOUNTER — Ambulatory Visit: Payer: 59 | Admitting: Medical

## 2023-07-14 DIAGNOSIS — M5412 Radiculopathy, cervical region: Secondary | ICD-10-CM | POA: Diagnosis not present

## 2023-07-15 ENCOUNTER — Telehealth: Payer: Self-pay | Admitting: Gastroenterology

## 2023-07-15 NOTE — Telephone Encounter (Signed)
Inbound call from patient stating that he had an appointment for 8/20 and canceled because he did not think he needed the appointment. Patient stated this morning after using the bathroom he noticed he was bleeding. Patient is requesting a call to discuss. Please advise.

## 2023-07-15 NOTE — Telephone Encounter (Signed)
Patient with history grade II internal hemorrhoids banded x 3 in 2023; colonoscopy 01/2022 showed diverticulosis, tubular adenomas and internal hemorrhoids.  Patient calls today stating that he had an appointment on 07/08/23 with Dr Barron Alvine but didn't think he needed it so cancelled (same day cancellation). He states that last night, he noticed a small amount of brb on his toilet tissue after BM. He denies straining for BM, denies any change in bowel habits, no melena, no abdominal or rectal pain. States he has only had this one episode of bleeding recently. Patient also states he is concerned because he has recently had some weight loss so wants to make sure nothing is wrong. I reassured patient that his small amount of bleeding is likely from internal hemorrhoids and advised that he follow a high fiber diet, increased water intake (he indicates that he does these things already). Also advised that he may purchase preparation h suppositories OTC and use as needed. Patient verbalizes understanding.  Patient's weight has fluctuated over the last 2 years between 199 pounds to current 185 pounds.   Patient has been scheduled for next available appointment with Dr Barron Alvine on 10/24/23 for further discussion of rectal bleeding and weight loss concerns.

## 2023-07-18 ENCOUNTER — Ambulatory Visit: Payer: 59 | Admitting: Medical

## 2023-07-25 ENCOUNTER — Ambulatory Visit (INDEPENDENT_AMBULATORY_CARE_PROVIDER_SITE_OTHER): Payer: 59 | Admitting: Medical

## 2023-07-25 VITALS — BP 104/84 | HR 70 | Temp 98.0°F | Resp 18 | Ht 72.0 in | Wt 186.4 lb

## 2023-07-25 DIAGNOSIS — E785 Hyperlipidemia, unspecified: Secondary | ICD-10-CM | POA: Diagnosis not present

## 2023-07-25 DIAGNOSIS — J449 Chronic obstructive pulmonary disease, unspecified: Secondary | ICD-10-CM | POA: Diagnosis not present

## 2023-07-25 DIAGNOSIS — M25519 Pain in unspecified shoulder: Secondary | ICD-10-CM | POA: Diagnosis not present

## 2023-07-25 DIAGNOSIS — M25552 Pain in left hip: Secondary | ICD-10-CM

## 2023-07-25 DIAGNOSIS — Z720 Tobacco use: Secondary | ICD-10-CM | POA: Diagnosis not present

## 2023-07-25 NOTE — Patient Instructions (Addendum)
Hip Pain Patient reports hip pain managed with intermittent steroid injections. Expressed interest in physical therapy as an alternative to frequent injections and to avoid hip surgery. -Continue physical therapy for hip pain management.(will place the referral specifically for hip therapy)  Tobacco Use Patient reports smoking approximately six cigarettes per day, down from half a pack. Discussed gradual reduction strategy with the goal of cessation. -Continue efforts to reduce and eventually cease tobacco use.  Mild copd type finding by ct.  Patient has been referred to a pulmonologist and is scheduled for a pulmonary function test in three months. No current inhaler prescription. -Continue follow-up with pulmonologist as scheduled.  hyperlipidemia Patient has incidental coronary calcifications on chest CT. Cardiologist recommended continuation of Lipitor 10mg  daily for cholesterol management. Patient also takes Losartan 25mg  daily for hypertension. -Continue Lipitor 10mg  daily and Losartan 25mg  daily.  Pain Management Patient reports intermittent pain managed with Celebrex as needed and gabapentin. Also has a muscle relaxant as a backup. -Continue current pain management regimen as needed.  Shoulder Pain Patient reports ongoing therapy for shoulder pain. -Continue physical therapy for shoulder pain management.  General Health Maintenance / Followup Plans -Check in after physical therapy sessions for hip and shoulder pain. -Continue follow-up with pulmonologist and  cardiologist  Follow up wellness exam jan 2025 or sooner if needed.

## 2023-07-25 NOTE — Progress Notes (Signed)
Subjective:    Patient ID: Cory Benson, male    DOB: 12-23-1965, 57 y.o.   MRN: [id number]  HPI Discussed the use of AI scribe software for clinical note transcription with the patient, who gave verbal consent to proceed.  History of Present Illness   The patient, with a history of shoulder surgery, hypertension, hyperlipidemia, and pulmonary issues, has been following up with multiple specialists. He has been seeing a pulmonologist and is scheduled for a pulmonary function test in three months. He reports no current use of inhalers and his chest x-rays have been satisfactory.  He has also been under the care of a cardiologist, who has recommended continuation of Lipitor 10mg  daily for hyperlipidemia. The patient confirms adherence to his blood pressure medication, Losartan 25mg  daily, and cholesterol management.  Bp level is good today.  The patient has been experiencing pain in his shoulder, back, and hip. He has been receiving steroid injections for hip pain and is considering physical therapy as an alternative to reduce the frequency of injections. He has been prescribed Celebrex and a muscle relaxant for pain management, which he takes sporadically as needed. He also continues to take Gabapentin.  He has been managing his pain through medication and is considering incorporating more physical activity, such as walking and stair climbing, into his routine.  The patient also reports a reduction in smoking, from half a pack to less than six cigarettes a day.       Left hip pain fo 2 years. Came on after back surgery.  Pt also wants parking placard due   Review of Systems See hpi.    Objective:   Physical Exam  General Mental Status- Alert. General Appearance- Not in acute distress.   Skin General: Color- Normal Color. Moisture- Normal Moisture.  Neck Carotid Arteries- Normal color. Moisture- Normal Moisture. No carotid bruits. No JVD.  Chest and Lung  Exam Auscultation: Breath Sounds:-Normal.  Cardiovascular Auscultation:Rythm- Regular. Murmurs & Other Heart Sounds:Auscultation of the heart reveals- No Murmurs.  Abdomen Inspection:-Inspeection Normal. Palpation/Percussion:Note:No mass. Palpation and Percussion of the abdomen reveal- Non Tender, Non Distended + BS, no rebound or guarding.  Neurologic Cranial Nerve exam:- CN III-XII intact(No nystagmus), symmetric smile. Strength:- 5/5 equal and symmetric strength both upper and lower extremities.   Left hip- pain on rom.     Assessment & Plan:  Assessment and Plan    Patient Instructions  Hip Pain Patient reports hip pain managed with intermittent steroid injections. Expressed interest in physical therapy as an alternative to frequent injections and to avoid hip surgery. -Continue physical therapy for hip pain management.(will place the referral specifically for hip therapy)  Tobacco Use Patient reports smoking approximately six cigarettes per day, down from half a pack. Discussed gradual reduction strategy with the goal of cessation. -Continue efforts to reduce and eventually cease tobacco use.  Mild copd type finding by ct.  Patient has been referred to a pulmonologist and is scheduled for a pulmonary function test in three months. No current inhaler prescription. -Continue follow-up with pulmonologist as scheduled.  hyperlipidemia Patient has incidental coronary calcifications on chest CT. Cardiologist recommended continuation of Lipitor 10mg  daily for cholesterol management. Patient also takes Losartan 25mg  daily for hypertension. -Continue Lipitor 10mg  daily and Losartan 25mg  daily.  Pain Management Patient reports intermittent pain managed with Celebrex as needed and gabapentin. Also has a muscle relaxant as a backup. -Continue current pain management regimen as needed.  Shoulder Pain Patient reports ongoing therapy for shoulder  pain. -Continue physical therapy for  shoulder pain management.  General Health Maintenance / Followup Plans -Check in after physical therapy sessions for hip and shoulder pain. -Continue follow-up with pulmonologist and  cardiologist  Follow up wellness exam jan 2025 or sooner if needed.     [name], [name]

## 2023-07-28 ENCOUNTER — Ambulatory Visit (HOSPITAL_BASED_OUTPATIENT_CLINIC_OR_DEPARTMENT_OTHER)
Admission: RE | Admit: 2023-07-28 | Discharge: 2023-07-28 | Disposition: A | Discharge status: Home / Self Care | Payer: 59 | Source: Ambulatory Visit | Attending: Medical | Admitting: Medical

## 2023-07-28 DIAGNOSIS — M25552 Pain in left hip: Secondary | ICD-10-CM | POA: Diagnosis not present

## 2023-07-28 DIAGNOSIS — M160 Bilateral primary osteoarthritis of hip: Secondary | ICD-10-CM | POA: Diagnosis not present

## 2023-07-28 DIAGNOSIS — M47816 Spondylosis without myelopathy or radiculopathy, lumbar region: Secondary | ICD-10-CM | POA: Diagnosis not present

## 2023-07-28 DIAGNOSIS — G8929 Other chronic pain: Secondary | ICD-10-CM | POA: Diagnosis not present

## 2023-07-30 ENCOUNTER — Ambulatory Visit: Payer: 59 | Attending: Neurosurgery

## 2023-07-30 DIAGNOSIS — M6281 Muscle weakness (generalized): Secondary | ICD-10-CM | POA: Diagnosis not present

## 2023-07-30 DIAGNOSIS — M5412 Radiculopathy, cervical region: Secondary | ICD-10-CM | POA: Insufficient documentation

## 2023-07-30 DIAGNOSIS — M25512 Pain in left shoulder: Secondary | ICD-10-CM | POA: Insufficient documentation

## 2023-07-30 DIAGNOSIS — G8929 Other chronic pain: Secondary | ICD-10-CM | POA: Diagnosis not present

## 2023-07-30 DIAGNOSIS — M25552 Pain in left hip: Secondary | ICD-10-CM | POA: Insufficient documentation
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
## 2023-08-13 ENCOUNTER — Ambulatory Visit: Payer: 59

## 2023-08-13 DIAGNOSIS — M5412 Radiculopathy, cervical region: Secondary | ICD-10-CM | POA: Diagnosis not present

## 2023-08-13 DIAGNOSIS — M6281 Muscle weakness (generalized): Secondary | ICD-10-CM | POA: Diagnosis not present

## 2023-08-13 DIAGNOSIS — M25512 Pain in left shoulder: Secondary | ICD-10-CM | POA: Diagnosis not present

## 2023-08-13 DIAGNOSIS — M25552 Pain in left hip: Secondary | ICD-10-CM

## 2023-08-13 DIAGNOSIS — G8929 Other chronic pain: Secondary | ICD-10-CM | POA: Diagnosis not present
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
## 2023-08-16 ENCOUNTER — Ambulatory Visit: Payer: 59

## 2023-08-16 DIAGNOSIS — G8929 Other chronic pain: Secondary | ICD-10-CM | POA: Diagnosis not present

## 2023-08-16 DIAGNOSIS — M5412 Radiculopathy, cervical region: Secondary | ICD-10-CM | POA: Diagnosis not present

## 2023-08-16 DIAGNOSIS — M25552 Pain in left hip: Secondary | ICD-10-CM

## 2023-08-16 DIAGNOSIS — M6281 Muscle weakness (generalized): Secondary | ICD-10-CM | POA: Diagnosis not present

## 2023-08-16 DIAGNOSIS — M25512 Pain in left shoulder: Secondary | ICD-10-CM | POA: Diagnosis not present
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
## 2023-08-19 ENCOUNTER — Ambulatory Visit: Payer: 59 | Attending: Medical

## 2023-08-19 ENCOUNTER — Telehealth: Payer: Self-pay

## 2023-08-19 DIAGNOSIS — M25512 Pain in left shoulder: Secondary | ICD-10-CM | POA: Insufficient documentation

## 2023-08-19 DIAGNOSIS — M25552 Pain in left hip: Secondary | ICD-10-CM | POA: Insufficient documentation

## 2023-08-19 DIAGNOSIS — M6281 Muscle weakness (generalized): Secondary | ICD-10-CM | POA: Insufficient documentation

## 2023-08-19 DIAGNOSIS — G8929 Other chronic pain: Secondary | ICD-10-CM | POA: Insufficient documentation

## 2023-08-19 DIAGNOSIS — M5412 Radiculopathy, cervical region: Secondary | ICD-10-CM | POA: Insufficient documentation

## 2023-08-19 NOTE — Telephone Encounter (Signed)
LVM regarding missed appointment. Reminded of clinic attendance policy and next appointment time.  1st no-show  Cory Benson, [place] 08/19/23 4:04 PM
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
## 2023-08-23 ENCOUNTER — Encounter: Payer: Self-pay | Admitting: Physical Therapy

## 2023-08-23 ENCOUNTER — Ambulatory Visit: Payer: 59 | Admitting: Physical Therapy

## 2023-08-23 DIAGNOSIS — G8929 Other chronic pain: Secondary | ICD-10-CM

## 2023-08-23 DIAGNOSIS — M5412 Radiculopathy, cervical region: Secondary | ICD-10-CM

## 2023-08-23 DIAGNOSIS — M25552 Pain in left hip: Secondary | ICD-10-CM | POA: Diagnosis not present

## 2023-08-23 DIAGNOSIS — M25512 Pain in left shoulder: Secondary | ICD-10-CM | POA: Diagnosis not present

## 2023-08-23 DIAGNOSIS — M6281 Muscle weakness (generalized): Secondary | ICD-10-CM
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
## 2023-08-27 ENCOUNTER — Encounter: Payer: Self-pay | Admitting: Physical Therapy

## 2023-08-27 ENCOUNTER — Ambulatory Visit: Payer: 59 | Admitting: Physical Therapy

## 2023-08-27 DIAGNOSIS — M5412 Radiculopathy, cervical region: Secondary | ICD-10-CM

## 2023-08-27 DIAGNOSIS — M25512 Pain in left shoulder: Secondary | ICD-10-CM | POA: Diagnosis not present

## 2023-08-27 DIAGNOSIS — M25552 Pain in left hip: Secondary | ICD-10-CM | POA: Diagnosis not present

## 2023-08-27 DIAGNOSIS — G8929 Other chronic pain: Secondary | ICD-10-CM | POA: Diagnosis not present

## 2023-08-27 DIAGNOSIS — M6281 Muscle weakness (generalized): Secondary | ICD-10-CM
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
## 2023-08-28 ENCOUNTER — Ambulatory Visit: Payer: 59 | Admitting: Sports Medicine

## 2023-08-30 ENCOUNTER — Ambulatory Visit: Payer: 59

## 2023-08-30 DIAGNOSIS — M5412 Radiculopathy, cervical region: Secondary | ICD-10-CM

## 2023-08-30 DIAGNOSIS — M25512 Pain in left shoulder: Secondary | ICD-10-CM | POA: Diagnosis not present

## 2023-08-30 DIAGNOSIS — M25552 Pain in left hip: Secondary | ICD-10-CM | POA: Diagnosis not present

## 2023-08-30 DIAGNOSIS — M6281 Muscle weakness (generalized): Secondary | ICD-10-CM | POA: Diagnosis not present

## 2023-08-30 DIAGNOSIS — G8929 Other chronic pain: Secondary | ICD-10-CM
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
## 2023-09-02 ENCOUNTER — Other Ambulatory Visit (HOSPITAL_COMMUNITY): Payer: Self-pay

## 2023-09-03 ENCOUNTER — Ambulatory Visit: Payer: 59

## 2023-09-03 DIAGNOSIS — M5412 Radiculopathy, cervical region: Secondary | ICD-10-CM

## 2023-09-03 DIAGNOSIS — M6281 Muscle weakness (generalized): Secondary | ICD-10-CM | POA: Diagnosis not present

## 2023-09-03 DIAGNOSIS — M25552 Pain in left hip: Secondary | ICD-10-CM | POA: Diagnosis not present

## 2023-09-03 DIAGNOSIS — G8929 Other chronic pain: Secondary | ICD-10-CM

## 2023-09-03 DIAGNOSIS — M25512 Pain in left shoulder: Secondary | ICD-10-CM | POA: Diagnosis not present
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
## 2023-09-04 ENCOUNTER — Ambulatory Visit: Payer: 59 | Admitting: Sports Medicine

## 2023-09-05 ENCOUNTER — Telehealth: Payer: Self-pay

## 2023-09-05 ENCOUNTER — Other Ambulatory Visit: Payer: Self-pay | Admitting: Medical

## 2023-09-05 NOTE — Telephone Encounter (Signed)
Called and LVM for patient letting them know that there is a refill already attached to his Atorvastatin. He should be able to go to the pharmacy and pick up.

## 2023-09-06 ENCOUNTER — Ambulatory Visit: Payer: 59 | Admitting: Physical Therapy

## 2023-09-06 ENCOUNTER — Encounter: Payer: Self-pay | Admitting: Physical Therapy

## 2023-09-06 DIAGNOSIS — M6281 Muscle weakness (generalized): Secondary | ICD-10-CM

## 2023-09-06 DIAGNOSIS — G8929 Other chronic pain: Secondary | ICD-10-CM

## 2023-09-06 DIAGNOSIS — M25552 Pain in left hip: Secondary | ICD-10-CM | POA: Diagnosis not present

## 2023-09-06 DIAGNOSIS — M5412 Radiculopathy, cervical region: Secondary | ICD-10-CM

## 2023-09-06 DIAGNOSIS — M25512 Pain in left shoulder: Secondary | ICD-10-CM | POA: Diagnosis not present
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
## 2023-09-06 NOTE — Patient Instructions (Signed)
Aquatic Therapy at [facility]-  What to Expect!  Where:   [facility] [address] [facility] [place], [place] [address] Rehab phone [phone]  NOTE:  You will receive an automated phone message reminding you of your appt and it will say the appointment is at the [address] [facility] [address] [facility] clinic.          How to Prepare: Please make sure you drink 8 ounces of water about one hour prior to your pool session A caregiver may attend if needed with the patient to help assist as needed. A caregiver can sit in the pool room on chair. Please arrive IN YOUR SUIT and 15 minutes prior to your appointment - this helps to avoid delays in starting your session. Please make sure to attend to any toileting needs prior to entering the pool Locker rooms for changing are provided.   There is direct access to the pool deck form the locker room.  You can lock your belongings in a locker with lock provided. Once on the pool deck your therapist will ask if you have signed the Patient  Consent and Assignment of Benefits form before beginning treatment Your therapist may take your blood pressure prior to, during and after your session if indicated We usually try and create a home exercise program based on activities we do in the pool.  Please be thinking about who might be able to assist you in the pool should you need to participate in an aquatic home exercise program at the time of discharge if you need assistance.  Some patients do not want to or do not have the ability to participate in an aquatic home program - this is not a barrier in any way to you participating in aquatic therapy as part of your current therapy plan! After Discharge from PT, you can continue using home program at  the [facility]/, there is a drop-in fee for $5 ($45 a month)or for 60 years  or older $4.00 ($40 a month for seniors ) or any local [facility] pool.  Memberships for purchase are  available for gym/pool at [facility]  IT IS VERY IMPORTANT THAT YOUR LAST VISIT BE IN THE CLINIC AT [address] AFTER YOUR LAST AQUATIC VISIT.  PLEASE MAKE SURE THAT YOU HAVE A [address]  APPOINTMENT SCHEDULED.   About the pool: Pool is located approximately 500 FT from the entrance of the building.  Please bring a support person if you need assistance traveling this      distance.   Your therapist will assist you in entering the water; there are two ways to           enter: stairs with railings, and a mechanical lift. Your therapist will determine the most appropriate way for you.  Water temperature is usually between 88-90 degrees  There may be up to 2 other swimmers in the pool at the same time  The pool deck is tile, please wear shoes with good traction if you prefer not to be barefoot.    Contact Info:  For appointment scheduling and cancellations:         Please call the [facility]  PH:[phone]              Aquatic Therapy  [facility]       All sessions are 45 minutes                                                    

## 2023-09-18 ENCOUNTER — Ambulatory Visit: Payer: 59 | Admitting: Physical Therapy
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
## 2023-09-22 ENCOUNTER — Ambulatory Visit: Payer: 59 | Admitting: Medical

## 2023-09-22 ENCOUNTER — Encounter: Payer: Self-pay | Admitting: Medical

## 2023-09-22 ENCOUNTER — Ambulatory Visit (INDEPENDENT_AMBULATORY_CARE_PROVIDER_SITE_OTHER): Payer: 59 | Admitting: Medical

## 2023-09-22 VITALS — BP 134/88 | HR 73 | Temp 98.0°F | Resp 18 | Ht 72.0 in | Wt 187.2 lb

## 2023-09-22 DIAGNOSIS — E785 Hyperlipidemia, unspecified: Secondary | ICD-10-CM | POA: Diagnosis not present

## 2023-09-22 DIAGNOSIS — I10 Essential (primary) hypertension: Secondary | ICD-10-CM | POA: Diagnosis not present

## 2023-09-22 DIAGNOSIS — Q678 Other congenital deformities of chest: Secondary | ICD-10-CM

## 2023-09-22 DIAGNOSIS — Z23 Encounter for immunization: Secondary | ICD-10-CM

## 2023-09-22 DIAGNOSIS — M25552 Pain in left hip: Secondary | ICD-10-CM | POA: Diagnosis not present

## 2023-09-22 DIAGNOSIS — S46811A Strain of other muscles, fascia and tendons at shoulder and upper arm level, right arm, initial encounter: Secondary | ICD-10-CM

## 2023-09-22 DIAGNOSIS — F17200 Nicotine dependence, unspecified, uncomplicated: Secondary | ICD-10-CM

## 2023-09-22 MED ORDER — ATORVASTATIN CALCIUM 10 MG PO TABS: 10.0000 mg | ORAL_TABLET | Freq: Every day | ORAL | 3 refills | Status: DC
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
## 2023-09-22 NOTE — Addendum Note (Signed)
Addended by: Maximino Sarin on: 09/22/2023 02:58 PM   Modules accepted: Orders

## 2023-09-22 NOTE — Patient Instructions (Addendum)
1. Smoker Recommend stop smoking completely.  2. Chest asymmetry(percieved but not on exam) -perceived by your wife but on inspection I think your chest is symmetric and no palpable or perceived gynecomastia. Rarely do order mammogram on male but do when clinically indicated. It is possible that your wife may have saw transient gynecomastia related to marijauna use. Will follow closely. Want to check you in 1 month. If ay changes on exam will go ahead and order for caution sake.   -also do self exams as if you felt abnormality I would order study under that scenario.  3. Hypertension, unspecified type -Continue Cozaar 25 mg daily  4. Hyperlipidemia, unspecified hyperlipidemia type -low cholesterol diet and refilled atorvastatin.  5. Pain of left hip -referred to different sport med MD. - Ambulatory referral to Sports Medicine   2nd shingrix vaccine today  Follow up one month or sooner if needed.

## 2023-09-25 ENCOUNTER — Ambulatory Visit: Payer: 59 | Attending: Medical | Admitting: Physical Therapy
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
## 2023-09-26 ENCOUNTER — Telehealth: Payer: Self-pay | Admitting: Medical

## 2023-09-26 NOTE — Progress Notes (Deleted)
    Aleen Sells [facility] [address] [place] [address] Phone: [phone]   Assessment and Plan:     There are no diagnoses linked to this encounter.  ***   Pertinent previous records reviewed include ***    Follow Up: ***     Subjective:   I, Dalana Pfahler, am serving as a Neurosurgeon for Doctor Richardean Sale  Chief Complaint: left hip pain   HPI:  09/29/2023 Patient is a 57 year old male with concerns of left hip pain.patient states  Relevant Historical Information: ***  Additional pertinent review of systems negative.   Current Outpatient Medications:    atorvastatin (LIPITOR) 10 MG tablet, Take 1 tablet (10 mg total) by mouth daily., Disp: 90 tablet, Rfl: 3   celecoxib (CELEBREX) 100 MG capsule, TAKE 1 CAPSULE(100 MG) BY MOUTH TWICE DAILY, Disp: 28 capsule, Rfl: 2   cyclobenzaprine (FLEXERIL) 10 MG tablet, Take 10 mg by mouth as needed for muscle spasms., Disp: , Rfl:    famotidine (PEPCID) 20 MG tablet, TAKE 1 TABLET BY MOUTH DAILY (Patient taking differently: as needed for heartburn.), Disp: 90 tablet, Rfl: 3   gabapentin (NEURONTIN) 300 MG capsule, Take 1 capsule (300 mg total) by mouth at bedtime., Disp: 90 capsule, Rfl: 0   losartan (COZAAR) 25 MG tablet, Take 1 tablet (25 mg total) by mouth daily., Disp: 90 tablet, Rfl: 3   nitroGLYCERIN (NITRODUR - DOSED IN MG/24 HR) 0.2 mg/hr patch, Use 1/4 patch daily to the affected area., Disp: 30 patch, Rfl: 1   tadalafil (CIALIS) 20 MG tablet, TAKE 1/2-1 TABLET BY MOUTH PROR TO SEX, Disp: 10 tablet, Rfl: 3   Objective:     There were no vitals filed for this visit.    There is no height or weight on file to calculate BMI.    Physical Exam:    ***   Electronically signed by:  Aleen Sells [facility] 7:38 AM 09/26/23

## 2023-09-26 NOTE — Telephone Encounter (Signed)
Copied from CRM [id number]. Topic: Medicare AWV >> Sep 26, 2023  1:22 PM Payton Doughty wrote: Reason for CRM: Called LVM 09/26/2023 to schedule AWV TELEHEALTH ONLY  Verlee Rossetti; Care Guide Ambulatory Clinical Support [place] [facility] Direct Dial: [phone]

## 2023-09-29 ENCOUNTER — Ambulatory Visit: Payer: 59 | Admitting: Sports Medicine

## 2023-09-30 ENCOUNTER — Ambulatory Visit (INDEPENDENT_AMBULATORY_CARE_PROVIDER_SITE_OTHER): Payer: 59 | Admitting: Sports Medicine

## 2023-09-30 VITALS — BP 130/86 | Ht 72.0 in | Wt 183.0 lb

## 2023-09-30 DIAGNOSIS — M1612 Unilateral primary osteoarthritis, left hip: Secondary | ICD-10-CM | POA: Diagnosis not present

## 2023-09-30 DIAGNOSIS — M7062 Trochanteric bursitis, left hip: Secondary | ICD-10-CM

## 2023-09-30 DIAGNOSIS — G8929 Other chronic pain: Secondary | ICD-10-CM

## 2023-09-30 DIAGNOSIS — M25552 Pain in left hip: Secondary | ICD-10-CM

## 2023-09-30 NOTE — Progress Notes (Signed)
Cory Benson [facility] [address] [place] [address] Phone: [phone]   Assessment and Plan:     1. Primary osteoarthritis of left hip 2. Chronic left hip pain 3. Greater trochanteric bursitis of left hip  -Chronic with exacerbation, initial sports medicine visit - Significant left hip pain most consistent with flare of severe osteoarthritis of left hip with additional pain over greater trochanter - Patient was previously receiving 2 to 3 months relief with intra-articular hip CSI, however he had a flare of pain with worsening symptoms after previous intra-articular hip CSI on 07/10/2023.  We discussed that ideally we would wait at least 3 months in between CSI, so patient elected to defer intra-articular hip CSI until follow-up visit - Patient was agreeable to greater trochanteric CSI.  Tolerated well per note below - May continue chronic medications Celebrex 100 mg daily as needed, gabapentin 300 mg nightly as needed, Flexeril 10 mg daily as needed for pain relief and muscle spasms  Procedure: Greater trochanteric bursal injection Side: Left  Risks explained and consent was given verbally. The site was cleaned with alcohol prep. A steroid injection was performed with patient in the lateral side-lying position at area of maximum tenderness over greater trochanter using 2mL of 1% lidocaine without epinephrine and 1mL of kenalog 40mg /ml. This was well tolerated and resulted in symptomatic relief.  Needle was removed, hemostasis achieved, and post injection instructions were explained.  Pt was advised to call or return to clinic if these symptoms worsen or fail to improve as anticipated.    Pertinent previous records reviewed include hip x-ray 07/28/2023, sports medicine note 07/10/2023, primary care notes 09/22/23  Follow Up: 3 weeks for reevaluation.  If continued hip pain, could consider intra-articular hip injection since it would be >3 months  since previous CSI   Subjective:   I, Cory Benson, am serving as a Neurosurgeon for Doctor Richardean Sale  Chief Complaint: left hip pain   HPI:  09/30/2023 Patient is a 57 year old male with concerns of left hip pain.Patient states chronic left hip pain Last seen by Dr. Jordan Likes on 03/03/2023 for left hip arthritis, and received a corticosteroid injection at that time.  He states steroid injections help him for 2 to 3 months.  No new injuries.  States he does not want surgical interventions at this time. Was given an injection 07/10/2023 and that caused him pain and he has been in pain ever since. Has decreased ROM and with certain movements he has pain that radiates down his leg. Celebrex, gabapentin, and flexeril help when he has to take them. No numbness , but states he has tingling. He is able to lay on his left side to sleep . He does endorse antalgic gait     Relevant Historical Information: History of lumbar spinal fusion  Additional pertinent review of systems negative.   Current Outpatient Medications:    atorvastatin (LIPITOR) 10 MG tablet, Take 1 tablet (10 mg total) by mouth daily., Disp: 90 tablet, Rfl: 3   celecoxib (CELEBREX) 100 MG capsule, TAKE 1 CAPSULE(100 MG) BY MOUTH TWICE DAILY, Disp: 28 capsule, Rfl: 2   cyclobenzaprine (FLEXERIL) 10 MG tablet, Take 10 mg by mouth as needed for muscle spasms., Disp: , Rfl:    famotidine (PEPCID) 20 MG tablet, TAKE 1 TABLET BY MOUTH DAILY (Patient taking differently: as needed for heartburn.), Disp: 90 tablet, Rfl: 3   gabapentin (NEURONTIN) 300 MG capsule, Take 1 capsule (300  mg total) by mouth at bedtime., Disp: 90 capsule, Rfl: 0   losartan (COZAAR) 25 MG tablet, Take 1 tablet (25 mg total) by mouth daily., Disp: 90 tablet, Rfl: 3   nitroGLYCERIN (NITRODUR - DOSED IN MG/24 HR) 0.2 mg/hr patch, Use 1/4 patch daily to the affected area., Disp: 30 patch, Rfl: 1   tadalafil (CIALIS) 20 MG tablet, TAKE 1/2-1 TABLET BY MOUTH PROR TO SEX,  Disp: 10 tablet, Rfl: 3   Objective:     Vitals:   09/30/23 1431 09/30/23 1435  BP:  130/86  Weight: 187 lb (84.8 kg) 183 lb (83 kg)  Height: 6' (1.829 m) 6' (1.829 m)      Body mass index is 24.82 kg/m.    Physical Exam:    General: awake, alert, and oriented no acute distress, nontoxic Skin: no suspicious lesions or rashes Neuro:sensation intact distally with no deficits, normal muscle tone, no atrophy, strength 5/5 in all tested lower ext groups Psych: normal mood and affect, speech clear   Left hip: No deformity, swelling or wasting ROM Flexion 90, ext 20, IR 30, ER 35 TTP greater trochanter, hip flexors, gluteal musculature NTTP over the si joint, lumbar spine Positive log roll with FROM Positive FABER Positive FADIR   Gait antalgic, favoring right leg   Electronically signed by:  Cory Benson [facility] 3:04 PM 09/30/23

## 2023-10-13 ENCOUNTER — Encounter: Payer: Self-pay | Admitting: Adult Health

## 2023-10-13 ENCOUNTER — Ambulatory Visit (INDEPENDENT_AMBULATORY_CARE_PROVIDER_SITE_OTHER): Payer: 59 | Admitting: Pulmonary Disease

## 2023-10-13 ENCOUNTER — Ambulatory Visit: Payer: 59 | Admitting: Adult Health

## 2023-10-13 VITALS — BP 126/87 | HR 60 | Temp 98.4°F | Ht 72.0 in | Wt 183.0 lb

## 2023-10-13 DIAGNOSIS — J432 Centrilobular emphysema: Secondary | ICD-10-CM | POA: Diagnosis not present

## 2023-10-13 DIAGNOSIS — F17200 Nicotine dependence, unspecified, uncomplicated: Secondary | ICD-10-CM | POA: Diagnosis not present

## 2023-10-13 MED ORDER — ALBUTEROL SULFATE HFA 108 (90 BASE) MCG/ACT IN AERS: 1.0000 | INHALATION_SPRAY | Freq: Four times a day (QID) | RESPIRATORY_TRACT | 2 refills | Status: DC | PRN

## 2023-10-13 NOTE — Patient Instructions (Addendum)
Albuterol inhaler 1-2 puffs every 6hr as needed  Activity as tolerated.  Work on not smoking .  Lung Cancer CT screening in June 2025 as planned.  Follow up with Dr. Tonia Brooms in 6 months and As needed
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
## 2023-10-13 NOTE — Progress Notes (Signed)
Full PFT performed today. °

## 2023-10-13 NOTE — Patient Instructions (Signed)
Full PFT performed today. °

## 2023-10-20 ENCOUNTER — Ambulatory Visit: Payer: 59 | Admitting: Medical

## 2023-10-20 NOTE — Progress Notes (Unsigned)
    Aleen Sells D.[facility] [address] [place] [address] Phone: [phone]   Assessment and Plan:     There are no diagnoses linked to this encounter.  ***   Pertinent previous records reviewed include ***    Follow Up: ***     Subjective:   I, Sharrie Self, am serving as a Neurosurgeon for Doctor Richardean Sale   Chief Complaint: left hip pain    HPI:  09/30/2023 Patient is a 57 year old male with concerns of left hip pain.Patient states chronic left hip pain Last seen by Dr. Jordan Likes on 03/03/2023 for left hip arthritis, and received a corticosteroid injection at that time.  He states steroid injections help him for 2 to 3 months.  No new injuries.  States he does not want surgical interventions at this time. Was given an injection 07/10/2023 and that caused him pain and he has been in pain ever since. Has decreased ROM and with certain movements he has pain that radiates down his leg. Celebrex, gabapentin, and flexeril help when he has to take them. No numbness , but states he has tingling. He is able to lay on his left side to sleep . He does endorse antalgic gait    10/21/2023 Patient states   Relevant Historical Information: History of lumbar spinal fusion Additional pertinent review of systems negative.   Current Outpatient Medications:    albuterol (VENTOLIN HFA) 108 (90 Base) MCG/ACT inhaler, Inhale 1-2 puffs into the lungs every 6 (six) hours as needed., Disp: 8 g, Rfl: 2   atorvastatin (LIPITOR) 10 MG tablet, Take 1 tablet (10 mg total) by mouth daily., Disp: 90 tablet, Rfl: 3   celecoxib (CELEBREX) 100 MG capsule, TAKE 1 CAPSULE(100 MG) BY MOUTH TWICE DAILY, Disp: 28 capsule, Rfl: 2   cyclobenzaprine (FLEXERIL) 10 MG tablet, Take 10 mg by mouth as needed for muscle spasms., Disp: , Rfl:    famotidine (PEPCID) 20 MG tablet, TAKE 1 TABLET BY MOUTH DAILY (Patient taking differently: as needed for heartburn.), Disp: 90 tablet,  Rfl: 3   gabapentin (NEURONTIN) 300 MG capsule, Take 1 capsule (300 mg total) by mouth at bedtime., Disp: 90 capsule, Rfl: 0   losartan (COZAAR) 25 MG tablet, Take 1 tablet (25 mg total) by mouth daily., Disp: 90 tablet, Rfl: 3   nitroGLYCERIN (NITRODUR - DOSED IN MG/24 HR) 0.2 mg/hr patch, Use 1/4 patch daily to the affected area., Disp: 30 patch, Rfl: 1   tadalafil (CIALIS) 20 MG tablet, TAKE 1/2-1 TABLET BY MOUTH PROR TO SEX, Disp: 10 tablet, Rfl: 3   Objective:     There were no vitals filed for this visit.    There is no height or weight on file to calculate BMI.    Physical Exam:    ***   Electronically signed by:  Aleen Sells [facility] 8:40 AM 10/20/23

## 2023-10-21 ENCOUNTER — Ambulatory Visit: Payer: 59 | Admitting: Sports Medicine

## 2023-10-24 ENCOUNTER — Encounter: Payer: Self-pay | Admitting: Gastroenterology

## 2023-10-24 ENCOUNTER — Ambulatory Visit (INDEPENDENT_AMBULATORY_CARE_PROVIDER_SITE_OTHER): Payer: 59 | Admitting: Medical

## 2023-10-24 ENCOUNTER — Encounter: Payer: Self-pay | Admitting: Medical

## 2023-10-24 ENCOUNTER — Ambulatory Visit (INDEPENDENT_AMBULATORY_CARE_PROVIDER_SITE_OTHER): Payer: 59 | Admitting: Gastroenterology

## 2023-10-24 VITALS — BP 118/70 | HR 85 | Ht 72.0 in | Wt 186.0 lb

## 2023-10-24 VITALS — BP 118/70 | HR 69 | Temp 98.0°F | Resp 16 | Ht 72.0 in | Wt 185.0 lb

## 2023-10-24 DIAGNOSIS — K625 Hemorrhage of anus and rectum: Secondary | ICD-10-CM

## 2023-10-24 DIAGNOSIS — M25552 Pain in left hip: Secondary | ICD-10-CM

## 2023-10-24 DIAGNOSIS — R519 Headache, unspecified: Secondary | ICD-10-CM

## 2023-10-24 DIAGNOSIS — Z860100 Personal history of colon polyps, unspecified: Secondary | ICD-10-CM

## 2023-10-24 DIAGNOSIS — F17200 Nicotine dependence, unspecified, uncomplicated: Secondary | ICD-10-CM

## 2023-10-24 DIAGNOSIS — E785 Hyperlipidemia, unspecified: Secondary | ICD-10-CM | POA: Diagnosis not present

## 2023-10-24 DIAGNOSIS — K649 Unspecified hemorrhoids: Secondary | ICD-10-CM

## 2023-10-24 DIAGNOSIS — K648 Other hemorrhoids: Secondary | ICD-10-CM | POA: Diagnosis not present

## 2023-10-24 DIAGNOSIS — I10 Essential (primary) hypertension: Secondary | ICD-10-CM | POA: Diagnosis not present

## 2023-10-24 LAB — SEDIMENTATION RATE: Sed Rate: 2 mm/h (ref 0–20)
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
## 2023-10-24 NOTE — Patient Instructions (Signed)
New onset left-sided headache Intermittent, non-severe, throbbing pain in the left temporal area for the past 3-4 weeks. No associated nausea, vomiting, or vision changes. Family history of brain tumor in paternal grandmother. -Order CT head pending insurance approval. -Order Sed rate today to evaluate for possible temporal arteritis. -Advise patient to seek emergency care if headache worsens or is associated with nausea, vomiting, or vision changes.  Tobacco use Patient is currently smoking and plans to gradually taper off. -Encourage smoking cessation and provide resources as needed.  Hyperlipidemia Patient is currently on atorvastatin for high cholesterol. -Continue atorvastatin.  Hypertension Patient is currently on losartan for high blood pressure. -Continue losartan.  Hx of subjective Left chest asymmetry No palpable lump or bump on exam today. -Advise patient to monitor area and report any changes. if any changes. Normal exam today. No imaging indicated. -Advise to dc marijuana use as can cause gynecomastia.  Hip pain Improvement with sports medicine injection. -sports medicine to get opinion on low back pain as well.  For further evaluation and possible MRI. -appt next tuesday  Follow-up in 3 months or sooner depending on test results.

## 2023-10-24 NOTE — Patient Instructions (Signed)
_______________________________________________________  If your blood pressure at your visit was 140/90 or greater, please contact your primary care physician to follow up on this.  If you are age 58 or younger, your body mass index should be between 19-25. Your Body mass index is 25.23 kg/m. If this is out of the aformentioned range listed, please consider follow up with your Primary Care Provider.  ________________________________________________________  The [facility] providers would like to encourage you to use [facility] to communicate with providers for non-urgent requests or questions.  Due to long hold times on the telephone, sending your provider a message by [facility] may be a faster and more efficient way to get a response.  Please allow 48 business hours for a response.  Please remember that this is for non-urgent requests.  _______________________________________________________  Cory Benson will follow up in our office on an as needed basis.   It was a pleasure to see you today!  Vito Cirigliano, D.O.

## 2023-10-25 ENCOUNTER — Ambulatory Visit (HOSPITAL_BASED_OUTPATIENT_CLINIC_OR_DEPARTMENT_OTHER)
Admission: RE | Admit: 2023-10-25 | Discharge: 2023-10-25 | Disposition: A | Discharge status: Home / Self Care | Payer: 59 | Source: Ambulatory Visit | Attending: Medical | Admitting: Medical

## 2023-10-25 DIAGNOSIS — R519 Headache, unspecified: Secondary | ICD-10-CM | POA: Insufficient documentation

## 2023-10-27 NOTE — Progress Notes (Unsigned)
    Cory Benson D.[facility] [address] [place] [address] Phone: [phone]   Assessment and Plan:     There are no diagnoses linked to this encounter.  ***   Pertinent previous records reviewed include ***    Follow Up: ***     Subjective:   I, Cory Benson, am serving as a Neurosurgeon for Cory Benson   Chief Complaint: left hip pain    HPI:  09/30/2023 Patient is a 57 year old male with concerns of left hip pain.Patient states chronic left hip pain Last seen by Dr. Jordan Likes on 03/03/2023 for left hip arthritis, and received a corticosteroid injection at that time.  He states steroid injections help him for 2 to 3 months.  No new injuries.  States he does not want surgical interventions at this time. Was given an injection 07/10/2023 and that caused him pain and he has been in pain ever since. Has decreased ROM and with certain movements he has pain that radiates down his leg. Celebrex, gabapentin, and flexeril help when he has to take them. No numbness , but states he has tingling. He is able to lay on his left side to sleep . He does endorse antalgic gait    10/28/2023 Patient states   Relevant Historical Information: History of lumbar spinal fusion Additional pertinent review of systems negative.   Current Outpatient Medications:    albuterol (VENTOLIN HFA) 108 (90 Base) MCG/ACT inhaler, Inhale 1-2 puffs into the lungs every 6 (six) hours as needed., Disp: 8 g, Rfl: 2   atorvastatin (LIPITOR) 10 MG tablet, Take 1 tablet (10 mg total) by mouth daily., Disp: 90 tablet, Rfl: 3   celecoxib (CELEBREX) 100 MG capsule, TAKE 1 CAPSULE(100 MG) BY MOUTH TWICE DAILY, Disp: 28 capsule, Rfl: 2   cyclobenzaprine (FLEXERIL) 10 MG tablet, Take 10 mg by mouth as needed for muscle spasms., Disp: , Rfl:    famotidine (PEPCID) 20 MG tablet, TAKE 1 TABLET BY MOUTH DAILY (Patient taking differently: as needed for heartburn.), Disp: 90 tablet,  Rfl: 3   gabapentin (NEURONTIN) 300 MG capsule, Take 1 capsule (300 mg total) by mouth at bedtime., Disp: 90 capsule, Rfl: 0   losartan (COZAAR) 25 MG tablet, Take 1 tablet (25 mg total) by mouth daily., Disp: 90 tablet, Rfl: 3   nitroGLYCERIN (NITRODUR - DOSED IN MG/24 HR) 0.2 mg/hr patch, Use 1/4 patch daily to the affected area., Disp: 30 patch, Rfl: 1   tadalafil (CIALIS) 20 MG tablet, TAKE 1/2-1 TABLET BY MOUTH PROR TO SEX, Disp: 10 tablet, Rfl: 3   Objective:     There were no vitals filed for this visit.    There is no height or weight on file to calculate BMI.    Physical Exam:    ***   Electronically signed by:  Cory Benson D.[facility] 12:21 PM 10/27/23

## 2023-10-28 ENCOUNTER — Ambulatory Visit (INDEPENDENT_AMBULATORY_CARE_PROVIDER_SITE_OTHER): Payer: 59 | Admitting: Sports Medicine

## 2023-10-28 ENCOUNTER — Other Ambulatory Visit: Payer: Self-pay

## 2023-10-28 VITALS — BP 118/78 | HR 67 | Ht 72.0 in | Wt 185.0 lb

## 2023-10-28 DIAGNOSIS — M7062 Trochanteric bursitis, left hip: Secondary | ICD-10-CM | POA: Diagnosis not present

## 2023-10-28 DIAGNOSIS — M25552 Pain in left hip: Secondary | ICD-10-CM | POA: Diagnosis not present

## 2023-10-28 DIAGNOSIS — G8929 Other chronic pain: Secondary | ICD-10-CM

## 2023-10-28 DIAGNOSIS — M1612 Unilateral primary osteoarthritis, left hip: Secondary | ICD-10-CM | POA: Diagnosis not present

## 2023-10-28 NOTE — Patient Instructions (Addendum)
3-4 week follow up

## 2023-11-04 ENCOUNTER — Telehealth: Payer: Self-pay | Admitting: Sports Medicine

## 2023-11-04 NOTE — Telephone Encounter (Signed)
Pt states during last visit 12/10 they discussed ordering a MRI of his back. Pt would like to move forward with ordering the MRI.

## 2023-11-05 ENCOUNTER — Other Ambulatory Visit: Payer: Self-pay | Admitting: Sports Medicine

## 2023-11-05 DIAGNOSIS — M1612 Unilateral primary osteoarthritis, left hip: Secondary | ICD-10-CM

## 2023-11-05 DIAGNOSIS — G8929 Other chronic pain: Secondary | ICD-10-CM

## 2023-11-05 DIAGNOSIS — M4722 Other spondylosis with radiculopathy, cervical region: Secondary | ICD-10-CM

## 2023-11-05 NOTE — Progress Notes (Signed)
Lumbar MRI placed

## 2023-11-06 NOTE — Telephone Encounter (Signed)
Pt notified of MRI order. Given Imaging's number for scheduling as no pre cert needed per referral notes.

## 2023-11-15 ENCOUNTER — Ambulatory Visit: Payer: 59

## 2023-11-15 DIAGNOSIS — M545 Low back pain, unspecified: Secondary | ICD-10-CM | POA: Diagnosis not present

## 2023-11-15 DIAGNOSIS — G8929 Other chronic pain: Secondary | ICD-10-CM

## 2023-11-15 DIAGNOSIS — M5136 Other intervertebral disc degeneration, lumbar region with discogenic back pain only: Secondary | ICD-10-CM | POA: Diagnosis not present

## 2023-11-15 DIAGNOSIS — M48061 Spinal stenosis, lumbar region without neurogenic claudication: Secondary | ICD-10-CM | POA: Diagnosis not present

## 2023-11-15 DIAGNOSIS — M4807 Spinal stenosis, lumbosacral region: Secondary | ICD-10-CM | POA: Diagnosis not present

## 2023-11-15 DIAGNOSIS — M47816 Spondylosis without myelopathy or radiculopathy, lumbar region: Secondary | ICD-10-CM | POA: Diagnosis not present

## 2023-11-15 DIAGNOSIS — M1612 Unilateral primary osteoarthritis, left hip: Secondary | ICD-10-CM

## 2023-11-15 DIAGNOSIS — M4722 Other spondylosis with radiculopathy, cervical region: Secondary | ICD-10-CM

## 2023-11-24 NOTE — Progress Notes (Deleted)
    [facility] [address] [place] [address] Phone: [phone]   Assessment and Plan:     There are no diagnoses linked to this encounter.  ***   Pertinent previous records reviewed include ***    Follow Up: ***     Subjective:   I, Cory Benson, am serving as a neurosurgeon for Doctor Cory Benson   Chief Complaint: left hip pain    HPI:  09/30/2023 Patient is a 58 year old male with concerns of left hip pain.Patient states chronic left hip pain Last seen by Dr. Chick on 03/03/2023 for left hip arthritis, and received a corticosteroid injection at that time.  He states steroid injections help him for 2 to 3 months.  No new injuries.  States he does not want surgical interventions at this time. Was given an injection 07/10/2023 and that caused him pain and he has been in pain ever since. Has decreased ROM and with certain movements he has pain that radiates down his leg. Celebrex , gabapentin , and flexeril  help when he has to take them. No numbness , but states he has tingling. He is able to lay on his left side to sleep . He does endorse antalgic gait    10/28/2023 Patient states he is still in pain. Feels like the pain is when he goes up the steps and any flexion. Hx of back surgery   11/25/2023 Patient states   Relevant Historical Information: History of lumbar spinal fusion  Additional pertinent review of systems negative.   Current Outpatient Medications:    albuterol  (VENTOLIN  HFA) 108 (90 Base) MCG/ACT inhaler, Inhale 1-2 puffs into the lungs every 6 (six) hours as needed., Disp: 8 g, Rfl: 2   atorvastatin  (LIPITOR) 10 MG tablet, Take 1 tablet (10 mg total) by mouth daily., Disp: 90 tablet, Rfl: 3   celecoxib  (CELEBREX ) 100 MG capsule, TAKE 1 CAPSULE(100 MG) BY MOUTH TWICE DAILY, Disp: 28 capsule, Rfl: 2   cyclobenzaprine  (FLEXERIL ) 10 MG tablet, Take 10 mg by mouth as needed for muscle spasms., Disp: , Rfl:     famotidine  (PEPCID ) 20 MG tablet, TAKE 1 TABLET BY MOUTH DAILY (Patient taking differently: as needed for heartburn.), Disp: 90 tablet, Rfl: 3   gabapentin  (NEURONTIN ) 300 MG capsule, Take 1 capsule (300 mg total) by mouth at bedtime., Disp: 90 capsule, Rfl: 0   losartan  (COZAAR ) 25 MG tablet, Take 1 tablet (25 mg total) by mouth daily., Disp: 90 tablet, Rfl: 3   nitroGLYCERIN  (NITRODUR - DOSED IN MG/24 HR) 0.2 mg/hr patch, Use 1/4 patch daily to the affected area., Disp: 30 patch, Rfl: 1   tadalafil  (CIALIS ) 20 MG tablet, TAKE 1/2-1 TABLET BY MOUTH PROR TO SEX, Disp: 10 tablet, Rfl: 3   Objective:     There were no vitals filed for this visit.    There is no height or weight on file to calculate BMI.    Physical Exam:    ***   Electronically signed by:  Cory Benson [facility] 9:36 AM 11/24/23

## 2023-11-25 ENCOUNTER — Ambulatory Visit: Payer: 59 | Admitting: Sports Medicine

## 2023-12-02 ENCOUNTER — Ambulatory Visit: Payer: 59 | Admitting: Sports Medicine

## 2023-12-02 ENCOUNTER — Other Ambulatory Visit: Payer: Self-pay | Admitting: Sports Medicine

## 2023-12-02 NOTE — Progress Notes (Signed)
 Cory Benson D.[facility] [address] [place] [address] Phone: [phone]   Assessment and Plan:     1. Primary osteoarthritis of left hip 2. Chronic left hip pain -Chronic with exacerbation, subsequent visit - Continued significant anterior hip pain consistent with severe osteoarthritis. - Patient is no longer receiving relief from intra-articular CSI with most recent on 10/28/2023.  Patient's lateral hip pain improved after greater trochanteric CSI on 09/30/2023, however no change in anterior groin pain - Patient is not sure if he is ready to proceed with surgery, though he is interested in discussing surgical options.  Will refer to orthopedic surgery for possible total hip replacement - Patient may continue chronic medications Celebrex  100 mg daily as needed, gabapentin  300 mg nightly as needed, Flexeril  10 mg daily as needed for pain relief and muscle spasms, however they have not adequately controlled patient's pain  3. Chronic bilateral low back pain without sciatica  -Chronic, stable, subsequent visit - Reviewed patient's lumbar MRI which showed stable fusion hardware at L4-L5, new mild degenerative changes at L3-L4, and unchanged mild to moderate degenerative changes at L5-S1 - Continue HEP  Pertinent previous records reviewed include lumbar MRI 11/15/2023  Follow Up: As needed   Subjective:   I, Cory Benson, am serving as a Neurosurgeon for Doctor Ulysees Gander   Chief Complaint: left hip pain    HPI:  09/30/2023 Patient is a 58 year old male with concerns of left hip pain.Patient states chronic left hip pain Last seen by Dr. Dorothey Gate on 03/03/2023 for left hip arthritis, and received a corticosteroid injection at that time.  He states steroid injections help him for 2 to 3 months.  No new injuries.  States he does not want surgical interventions at this time. Was given an injection 07/10/2023 and that caused him pain and he has  been in pain ever since. Has decreased ROM and with certain movements he has pain that radiates down his leg. Celebrex , gabapentin , and flexeril  help when he has to take them. No numbness , but states he has tingling. He is able to lay on his left side to sleep . He does endorse antalgic gait    10/28/2023 Patient states he is still in pain. Feels like the pain is when he goes up the steps and any flexion. Hx of back surgery   12/03/2023 Patient states shot didn't work pain is in the deep groin but ROM has increased    Relevant Historical Information: History of lumbar spinal fusion  Additional pertinent review of systems negative.   Current Outpatient Medications:    albuterol  (VENTOLIN  HFA) 108 (90 Base) MCG/ACT inhaler, Inhale 1-2 puffs into the lungs every 6 (six) hours as needed., Disp: 8 g, Rfl: 2   atorvastatin  (LIPITOR) 10 MG tablet, Take 1 tablet (10 mg total) by mouth daily., Disp: 90 tablet, Rfl: 3   celecoxib  (CELEBREX ) 100 MG capsule, TAKE 1 CAPSULE(100 MG) BY MOUTH TWICE DAILY, Disp: 28 capsule, Rfl: 2   cyclobenzaprine  (FLEXERIL ) 10 MG tablet, Take 10 mg by mouth as needed for muscle spasms., Disp: , Rfl:    famotidine  (PEPCID ) 20 MG tablet, TAKE 1 TABLET BY MOUTH DAILY (Patient taking differently: as needed for heartburn.), Disp: 90 tablet, Rfl: 3   gabapentin  (NEURONTIN ) 300 MG capsule, Take 1 capsule (300 mg total) by mouth at bedtime., Disp: 90 capsule, Rfl: 0   losartan  (COZAAR ) 25 MG tablet, Take 1 tablet (25 mg total) by  mouth daily., Disp: 90 tablet, Rfl: 3   nitroGLYCERIN  (NITRODUR - DOSED IN MG/24 HR) 0.2 mg/hr patch, Use 1/4 patch daily to the affected area., Disp: 30 patch, Rfl: 1   tadalafil  (CIALIS ) 20 MG tablet, TAKE 1/2-1 TABLET BY MOUTH PROR TO SEX, Disp: 10 tablet, Rfl: 3   Objective:     Vitals:   12/03/23 1551  BP: 132/78  Pulse: 78  SpO2: 100%  Weight: 184 lb (83.5 kg)  Height: 6' (1.829 m)      Body mass index is 24.95 kg/m.    Physical Exam:     General: awake, alert, and oriented no acute distress, nontoxic Skin: no suspicious lesions or rashes Neuro:sensation intact distally with no deficits, normal muscle tone, no atrophy, strength 5/5 in all tested lower ext groups Psych: normal mood and affect, speech clear   Left hip: No deformity, swelling or wasting ROM Flexion 90, ext 20, IR 30, ER 35 TTP  hip flexors NTTP over the si joint, lumbar spine, greater trochanter,gluteal musculature Positive log roll with FROM Positive FABER Positive FADIR   Gait antalgic, favoring right leg   Electronically signed by:  Marshall Skeeter D.[facility] 4:44 PM 12/03/23

## 2023-12-03 ENCOUNTER — Ambulatory Visit: Payer: 59 | Admitting: Sports Medicine

## 2023-12-03 VITALS — BP 132/78 | HR 78 | Ht 72.0 in | Wt 184.0 lb

## 2023-12-03 DIAGNOSIS — M545 Low back pain, unspecified: Secondary | ICD-10-CM | POA: Diagnosis not present

## 2023-12-03 DIAGNOSIS — M25552 Pain in left hip: Secondary | ICD-10-CM | POA: Diagnosis not present

## 2023-12-03 DIAGNOSIS — G8929 Other chronic pain: Secondary | ICD-10-CM | POA: Diagnosis not present

## 2023-12-03 DIAGNOSIS — M1612 Unilateral primary osteoarthritis, left hip: Secondary | ICD-10-CM | POA: Diagnosis not present

## 2023-12-03 NOTE — Patient Instructions (Signed)
Ortho referral  As needed follow up

## 2023-12-17 LAB — PULMONARY FUNCTION TEST
DL/VA % pred: 94 %
DL/VA: 3.99 ml/min/mmHg/L
DLCO cor % pred: 72 %
DLCO cor: 21.43 ml/min/mmHg
DLCO unc % pred: 72 %
DLCO unc: 21.43 ml/min/mmHg
FEF 25-75 Post: 1.78 L/s
FEF 25-75 Pre: 1.75 L/s
FEF2575-%Change-Post: 1 %
FEF2575-%Pred-Post: 54 %
FEF2575-%Pred-Pre: 53 %
FEV1-%Change-Post: 1 %
FEV1-%Pred-Post: 61 %
FEV1-%Pred-Pre: 60 %
FEV1-Post: 2.41 L
FEV1-Pre: 2.37 L
FEV1FVC-%Change-Post: 1 %
FEV1FVC-%Pred-Pre: 93 %
FEV6-%Change-Post: -3 %
FEV6-%Pred-Post: 65 %
FEV6-%Pred-Pre: 67 %
FEV6-Post: 3.22 L
FEV6-Pre: 3.33 L
FEV6FVC-%Change-Post: 0 %
FEV6FVC-%Pred-Post: 104 %
FEV6FVC-%Pred-Pre: 104 %
FVC-%Change-Post: 0 %
FVC-%Pred-Post: 64 %
FVC-%Pred-Pre: 64 %
FVC-Post: 3.34 L
FVC-Pre: 3.33 L
Post FEV1/FVC ratio: 72 %
Post FEV6/FVC ratio: 100 %
Pre FEV1/FVC ratio: 71 %
Pre FEV6/FVC Ratio: 100 %
RV % pred: 91 %
RV: 2.12 L
TLC % pred: 76 %
TLC: 5.65 L

## 2023-12-25 ENCOUNTER — Ambulatory Visit: Payer: 59 | Admitting: Orthopaedic Surgery

## 2023-12-25 ENCOUNTER — Encounter: Payer: Self-pay | Admitting: Orthopaedic Surgery

## 2023-12-25 DIAGNOSIS — M1612 Unilateral primary osteoarthritis, left hip: Secondary | ICD-10-CM | POA: Diagnosis not present

## 2023-12-25 DIAGNOSIS — M25552 Pain in left hip: Secondary | ICD-10-CM

## 2023-12-25 NOTE — Progress Notes (Signed)
 The patient is a 58 year old gentleman that I am seeing for the first time.  He comes in for evaluation and treatment of significant left hip pain and known severe end-stage arthritis of his left hip.  He does report groin pain.  He walks with a limp.  If he has been sitting for a while and goes to stand up and take steps he has significant pain in the left hip.  He has had extensive back surgery at L4-L5.  He has had multiple steroid injections in his left hip joint and they used to help him quite a bit but now they have been of no help.  He is otherwise someone who is thin and active.  He is not on blood thinning medication.  I did review his medications and past medical history within epic.  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  At this point his left hip pain is definitely affecting his mobility, his quality of life and his actives daily living.  Examination of his left hip shows stiffness with internal and external rotation as well as pain in the groin with rotation.  His right hip moves smoothly and fluidly with no difficulty at all.  X-rays on the canopy system reviewed with him of his pelvis and left hip.  His left hip has complete loss of the joint space at this point.  It is bone-on-bone wear with no space remaining at all.  The femoral head is flattened as well.  His right hip shows a more congruent joint space.  We had a long and thorough discussion about hip replacement surgery.  I showed him hip replacement model as well as hip replacement components.  We gave him a handout about hip replacement surgery and went over his x-rays.  The surgery was discussed in detail including what to expect from an intraoperative and postoperative course as well as discussing the risks and benefits of surgery in detail.  All questions and concerns were answered and addressed.  He said he would like to go  home and talk to his wife about this.  He does have my card and our surgery scheduler's card and will contact us  if he decides to proceed with a left hip replacement.

## 2024-01-23 ENCOUNTER — Ambulatory Visit: Payer: 59 | Admitting: Medical

## 2024-01-28 ENCOUNTER — Ambulatory Visit: Payer: 59 | Admitting: Medical

## 2024-01-28 ENCOUNTER — Telehealth: Payer: Self-pay | Admitting: Medical

## 2024-01-28 NOTE — Telephone Encounter (Signed)
 Pt showed up for his 3:40 appt today at 3:51. Pt was told that it would have to be approved since he showed up after the ten minute grace period. Pt stated that it was "just one minute" so it didn't matter and asked fo staff member if they were "being serious rn". Pt was informed while waiting on approval for check in from pcp's cma that pcp was running an hour behind. Pt stated that he was not waiting that long and that pcp would see him right away. Pt was then informed that he needed to reschedule. Pt said that he hadn't been seen in four months and needed to be seen now or go to the ed. Pt then rescheduled for next Tuesday at 3:40. Pt then walked out and slammed the door into the wall breaking the dry\wall and pushing the door stop inside the wall. As pt slammed the door he yelled "m-fer" and stormed down the hallway.

## 2024-02-02 ENCOUNTER — Telehealth: Payer: Self-pay | Admitting: Orthopaedic Surgery

## 2024-02-02 NOTE — Telephone Encounter (Signed)
 Pt states he is ready to go ahead with hip surgery

## 2024-02-03 ENCOUNTER — Encounter: Payer: Self-pay | Admitting: Medical

## 2024-02-03 ENCOUNTER — Ambulatory Visit (HOSPITAL_BASED_OUTPATIENT_CLINIC_OR_DEPARTMENT_OTHER)
Admission: RE | Admit: 2024-02-03 | Discharge: 2024-02-03 | Disposition: A | Discharge status: Home / Self Care | Source: Ambulatory Visit | Attending: Medical | Admitting: Medical

## 2024-02-03 ENCOUNTER — Ambulatory Visit (INDEPENDENT_AMBULATORY_CARE_PROVIDER_SITE_OTHER): Admitting: Medical

## 2024-02-03 VITALS — BP 114/80 | HR 86 | Temp 98.0°F | Resp 18 | Ht 72.0 in | Wt 183.6 lb

## 2024-02-03 DIAGNOSIS — M25561 Pain in right knee: Secondary | ICD-10-CM | POA: Insufficient documentation

## 2024-02-03 DIAGNOSIS — Z860100 Personal history of colon polyps, unspecified: Secondary | ICD-10-CM

## 2024-02-03 DIAGNOSIS — M1712 Unilateral primary osteoarthritis, left knee: Secondary | ICD-10-CM | POA: Diagnosis not present

## 2024-02-03 DIAGNOSIS — F17200 Nicotine dependence, unspecified, uncomplicated: Secondary | ICD-10-CM | POA: Diagnosis not present

## 2024-02-03 DIAGNOSIS — R350 Frequency of micturition: Secondary | ICD-10-CM

## 2024-02-03 DIAGNOSIS — M25562 Pain in left knee: Secondary | ICD-10-CM

## 2024-02-03 DIAGNOSIS — I10 Essential (primary) hypertension: Secondary | ICD-10-CM | POA: Diagnosis not present

## 2024-02-03 DIAGNOSIS — R319 Hematuria, unspecified: Secondary | ICD-10-CM | POA: Diagnosis not present

## 2024-02-03 DIAGNOSIS — E785 Hyperlipidemia, unspecified: Secondary | ICD-10-CM

## 2024-02-03 DIAGNOSIS — M25552 Pain in left hip: Secondary | ICD-10-CM | POA: Diagnosis not present

## 2024-02-03 DIAGNOSIS — Z125 Encounter for screening for malignant neoplasm of prostate: Secondary | ICD-10-CM

## 2024-02-03 MED ORDER — CYCLOBENZAPRINE HCL 10 MG PO TABS: ORAL_TABLET | ORAL | 0 refills | Status: DC

## 2024-02-03 MED ORDER — GABAPENTIN 300 MG PO CAPS: 300.0000 mg | ORAL_CAPSULE | Freq: Every day | ORAL | 0 refills | Status: DC

## 2024-02-03 MED ORDER — CELECOXIB 100 MG PO CAPS: 100.0000 mg | ORAL_CAPSULE | Freq: Every day | ORAL | 0 refills | Status: DC

## 2024-02-03 NOTE — Progress Notes (Unsigned)
 Subjective:    Patient ID: Cory Benson, male    DOB: Aug 28, 1966, 58 y.o.   MRN: [id number]  HPI  Discussed the use of AI scribe software for clinical note transcription with the patient, who gave verbal consent to proceed.  History of Present Illness   Cory Benson is a 58 year old male who presents with right knee pain and left hip pain.  He has been experiencing severe pain in his right knee for the past three to four weeks. The pain is located near the kneecap and is associated with a feeling of instability, particularly when not using a knee brace. He underwent arthroscopic surgery on this knee in 1993 and has been using a knee brace for stability since then. The pain significantly affects his ability to walk and perform his job.  He also has a history of left hip pain, which he describes as 'bone to bone,' causing significant discomfort and affecting his mobility. The pain radiates to the groin area and disrupts his sleep, making it difficult to move at night. Has upcoming surgery scheduled.  He has a history of smoking and is actively trying to quit, especially after meals. He has undergone CT lung cancer screening in 2023 and 2024. He has a history of colonoscopy with polyp removal and was advised to repeat the procedure sooner than ten years due to the presence of polyps. He also has a history of hemorrhoids, occasionally noticing blood when straining during bowel movements. Surgical procedure for hemorrhoids in past. No active symptoms presently.  He takes gabapentin 300 mg as needed for back pain and sciatica, Celebrex occasionally for pain, and cyclobenzaprine as needed for muscle relaxation and sleep. He is on losartan 25 mg daily for blood pressure and atorvastatin for cholesterol management.  No constant heartburn, blood in urine, or frequent urination beyond what is expected from drinking water at night. He limps due to hip and knee pain.        Review of Systems See hpi     Objective:   Physical Exam  General- No acute distress. Pleasant patient. Neck- Full range of motion, Lungs- Clear, even and unlabored. Heart- regular rate and rhythm. Neurologic- CNII- XII grossly intact.  Rt knee- mid crepitus on flexion and extension.     Assessment & Plan:  Assessment and Plan    Right Knee Pain and left knee pain Chronic pain with history of  both knees arthroscopic surgery. Severe pain affects mobility, requiring knee brace. Possible loose cartilage involvement. - Order x-ray of the right knee. - Consider further orthopedic evaluation if x-ray findings are abnormal.  Left Hip Osteoarthritis Severe pain with bone-on-bone contact likely due to osteoarthritis, affecting mobility. - surgery is pending  Chronic Pain Management Chronic pain managed with gabapentin, Celebrex, and cyclobenzaprine. Celebrex used cautiously due to renal effects. Tylenol suggested as alternative. - Refill gabapentin 300 mg as needed for pain.(max once daily) - Refill Celebrex as needed for pain.(once daily at most but spraringly such as once every 3rd day) - Refill cyclobenzaprine as needed for muscle relaxation. - Consider Tylenol as an alternative to Celebrex.  Hypertension Blood pressure well-controlled on losartan 25 mg daily. - Continue losartan 25 mg daily.  Hyperlipidemia Cholesterol levels well-controlled on atorvastatin. Fasting lipid panel needed. - Order fasting lipid panel. - Schedule fasting blood draw appointment.  Colorectal Polyp Surveillance History of colorectal polyps. Awaiting GI doctor feedback for colonoscopy interval. - Await feedback from GI doctor regarding colonoscopy interval.  Prostate  Cancer Screening Routine screening indicated due to age and history. Previous PSA levels normal. - Order PSA blood test.  Lung Cancer Screening Smoker with previous CT lung cancer screenings. Next screening due June 2025. - Schedule CT lung cancer screening for  June 2025.(can't schedule months out)  Smoking Cessation History of smoking, attempting to reduce frequency. Aware of health risks. - Encourage continued efforts to reduce smoking. - Discuss smoking cessation strategies.  General Health Maintenance Up to date with most screenings. Due for pneumonia vaccine. - pt declines pneumonia vaccine.  Follow-up Follow-up necessary to review lab results and new findings. - Schedule follow-up appointment after lab results are available. - Instruct him to contact insurance for clarification on additional cancer screenings.       Time spent with patient today was 45  minutes which consisted of chart revdew, discussing diagnosis, work up treatment and documentation.

## 2024-02-03 NOTE — Patient Instructions (Signed)
 Right Knee Pain and left knee pain Chronic pain with history of  both knees arthroscopic surgery. Severe pain affects mobility, requiring knee brace. Possible loose cartilage involvement. - Order x-ray of the right knee. - Consider further orthopedic evaluation if x-ray findings are abnormal.  Left Hip Osteoarthritis Severe pain with bone-on-bone contact likely due to osteoarthritis, affecting mobility. - surgery is pending  Chronic Pain Management Chronic pain managed with gabapentin, Celebrex, and cyclobenzaprine. Celebrex used cautiously due to renal effects. Tylenol suggested as alternative. - Refill gabapentin 300 mg as needed for pain.(max once daily) - Refill Celebrex as needed for pain.(once daily at most but spraringly such as once every 3rd day) - Refill cyclobenzaprine as needed for muscle relaxation. - Consider Tylenol as an alternative to Celebrex.  Hypertension Blood pressure well-controlled on losartan 25 mg daily. - Continue losartan 25 mg daily.  Hyperlipidemia Cholesterol levels well-controlled on atorvastatin. Fasting lipid panel needed. - Order fasting lipid panel. - Schedule fasting blood draw appointment.  Colorectal Polyp Surveillance History of colorectal polyps. Awaiting GI doctor feedback for colonoscopy interval. - Await feedback from GI doctor regarding colonoscopy interval.  Prostate Cancer Screening Routine screening indicated due to age and history. Previous PSA levels normal. - Order PSA blood test.  Lung Cancer Screening Smoker with previous CT lung cancer screenings. Next screening due June 2025. - Schedule CT lung cancer screening for June 2025.(can't schedule months out)  Smoking Cessation History of smoking, attempting to reduce frequency. Aware of health risks. - Encourage continued efforts to reduce smoking. - Discuss smoking cessation strategies.  General Health Maintenance Up to date with most screenings. Due for pneumonia  vaccine. - pt declines pneumonia vaccine.  Follow-up Follow-up necessary to review lab results and new findings. - Schedule follow-up appointment after lab results are available. - Instruct him to contact insurance for clarification on additional cancer screenings.

## 2024-02-03 NOTE — Telephone Encounter (Signed)
 I called and left voice mail for call back to discuss scheduling.

## 2024-02-04 NOTE — Telephone Encounter (Signed)
Spoke with patient and scheduled surgery. °

## 2024-02-10 ENCOUNTER — Other Ambulatory Visit (INDEPENDENT_AMBULATORY_CARE_PROVIDER_SITE_OTHER)

## 2024-02-10 ENCOUNTER — Encounter: Payer: Self-pay | Admitting: Medical

## 2024-02-10 DIAGNOSIS — Z125 Encounter for screening for malignant neoplasm of prostate: Secondary | ICD-10-CM

## 2024-02-10 DIAGNOSIS — R350 Frequency of micturition: Secondary | ICD-10-CM | POA: Diagnosis not present

## 2024-02-10 DIAGNOSIS — R3129 Other microscopic hematuria: Secondary | ICD-10-CM

## 2024-02-10 DIAGNOSIS — E785 Hyperlipidemia, unspecified: Secondary | ICD-10-CM

## 2024-02-10 DIAGNOSIS — I10 Essential (primary) hypertension: Secondary | ICD-10-CM

## 2024-02-10 LAB — COMPREHENSIVE METABOLIC PANEL
ALT: 15 U/L (ref 0–53)
AST: 17 U/L (ref 0–37)
Albumin: 4.1 g/dL (ref 3.5–5.2)
Alkaline Phosphatase: 85 U/L (ref 39–117)
BUN: 15 mg/dL (ref 6–23)
CO2: 30 meq/L (ref 19–32)
Calcium: 9.3 mg/dL (ref 8.4–10.5)
Chloride: 102 meq/L (ref 96–112)
Creatinine, Ser: 1.03 mg/dL (ref 0.40–1.50)
GFR: 80.09 mL/min (ref >60.00)
Glucose, Bld: 99 mg/dL (ref 70–99)
Potassium: 4.5 meq/L (ref 3.5–5.1)
Sodium: 138 meq/L (ref 135–145)
Total Bilirubin: 0.5 mg/dL (ref 0.2–1.2)
Total Protein: 7 g/dL (ref 6.0–8.3)

## 2024-02-10 LAB — POC URINALSYSI DIPSTICK (AUTOMATED)
Bilirubin, UA: NEGATIVE
Glucose, UA: NEGATIVE
Ketones, UA: NEGATIVE
Nitrite, UA: NEGATIVE
Protein, UA: NEGATIVE
Spec Grav, UA: 1.015 (ref 1.010–1.025)
Urobilinogen, UA: 0.2 U/dL
pH, UA: 6 (ref 5.0–8.0)

## 2024-02-10 LAB — LIPID PANEL
Cholesterol: 120 mg/dL (ref 0–200)
HDL: 49.6 mg/dL (ref >39.00)
LDL Cholesterol: 62 mg/dL (ref 0–99)
NonHDL: 70.44
Total CHOL/HDL Ratio: 2
Triglycerides: 44 mg/dL (ref 0.0–149.0)
VLDL: 8.8 mg/dL (ref 0.0–40.0)

## 2024-02-10 LAB — CBC WITH DIFFERENTIAL/PLATELET
Basophils Absolute: 0.1 10*3/uL (ref 0.0–0.1)
Basophils Relative: 1 % (ref 0.0–3.0)
Eosinophils Absolute: 0.2 10*3/uL (ref 0.0–0.7)
Eosinophils Relative: 3.9 % (ref 0.0–5.0)
HCT: 44.7 % (ref 39.0–52.0)
Hemoglobin: 14.9 g/dL (ref 13.0–17.0)
Lymphocytes Relative: 31 % (ref 12.0–46.0)
Lymphs Abs: 1.7 10*3/uL (ref 0.7–4.0)
MCHC: 33.3 g/dL (ref 30.0–36.0)
MCV: 95 fl (ref 78.0–100.0)
Monocytes Absolute: 0.6 10*3/uL (ref 0.1–1.0)
Monocytes Relative: 10.4 % (ref 3.0–12.0)
Neutro Abs: 2.9 10*3/uL (ref 1.4–7.7)
Neutrophils Relative %: 53.7 % (ref 43.0–77.0)
Platelets: 225 10*3/uL (ref 150.0–400.0)
RBC: 4.7 Mil/uL (ref 4.22–5.81)
RDW: 14.4 % (ref 11.5–15.5)
WBC: 5.4 10*3/uL (ref 4.0–10.5)

## 2024-02-10 LAB — PSA: PSA: 0.62 ng/mL (ref 0.10–4.00)

## 2024-02-10 NOTE — Addendum Note (Signed)
 Addended by: Gwenevere Abbot on: 02/10/2024 09:21 PM   Modules accepted: Orders

## 2024-02-10 NOTE — Addendum Note (Signed)
 Addended by: Mervin Kung A on: 02/10/2024 11:59 AM   Modules accepted: Orders

## 2024-02-11 LAB — URINE CULTURE
MICRO NUMBER:: 16244534
Result:: NO GROWTH
SPECIMEN QUALITY:: ADEQUATE

## 2024-02-17 NOTE — Progress Notes (Signed)
 Surgical Instructions   Your procedure is scheduled on Tuesday, April 15th, 2025. Report to [facility] Main Entrance "A" at 8:00 A.M., then check in with the Admitting office. Any questions or running late day of surgery: call [phone]  Questions prior to your surgery date: call [phone], Monday-Friday, 8am-4pm. If you experience any cold or flu symptoms such as cough, fever, chills, shortness of breath, etc. between now and your scheduled surgery, please notify us at the above number.     Remember:  Do not eat after midnight the night before your surgery  You may drink clear liquids until 7:00 the morning of your surgery.   Clear liquids allowed are: Water, Non-Citrus Juices (without pulp), Carbonated Beverages, Clear Tea (no milk, honey, etc.), Black Coffee Only (NO MILK, CREAM OR POWDERED CREAMER of any kind), and Gatorade.    Take these medicines the morning of surgery with A SIP OF WATER: Atorvastatin (Lipitor)   May take these medicines IF NEEDED: Cyclobenzaprine (Flexeril)    One week prior to surgery, STOP taking any Aspirin (unless otherwise instructed by your surgeon) Aleve, Naproxen, Ibuprofen, Motrin, Advil, Goody's, BC's, all herbal medications, fish oil, and non-prescription vitamins.  This includes Celecoxib (Celebrex)                     Do NOT Smoke (Tobacco/Vaping) for 24 hours prior to your procedure.  If you use a CPAP at night, you may bring your mask/headgear for your overnight stay.   You will be asked to remove any contacts, glasses, piercing's, hearing aid's, dentures/partials prior to surgery. Please bring cases for these items if needed.    Patients discharged the day of surgery will not be allowed to drive home, and someone needs to stay with them for 24 hours.  SURGICAL WAITING ROOM VISITATION Patients may have no more than 2 support people in the waiting area - these visitors may rotate.   Pre-op nurse will coordinate an appropriate time  for 1 ADULT support person, who may not rotate, to accompany patient in pre-op.  Children under the age of 81 must have an adult with them who is not the patient and must remain in the main waiting area with an adult.  If the patient needs to stay at the hospital during part of their recovery, the visitor guidelines for inpatient rooms apply.  Please refer to the [facility] website for the visitor guidelines for any additional information.   If you received a COVID test during your pre-op visit  it is requested that you wear a mask when out in public, stay away from anyone that may not be feeling well and notify your surgeon if you develop symptoms. If you have been in contact with anyone that has tested positive in the last 10 days please notify you surgeon.      Pre-operative 5 CHG Bathing Instructions   You can play a key role in reducing the risk of infection after surgery. Your skin needs to be as free of germs as possible. You can reduce the number of germs on your skin by washing with CHG (chlorhexidine gluconate) soap before surgery. CHG is an antiseptic soap that kills germs and continues to kill germs even after washing.   DO NOT use if you have an allergy to chlorhexidine/CHG or antibacterial soaps. If your skin becomes reddened or irritated, stop using the CHG and notify one of our RNs at [phone].   Please shower with the CHG soap  starting 4 days before surgery using the following schedule:     Please keep in mind the following:  DO NOT shave, including legs and underarms, starting the day of your first shower.   You may shave your face at any point before/day of surgery.  Place clean sheets on your bed the day you start using CHG soap. Use a clean washcloth (not used since being washed) for each shower. DO NOT sleep with pets once you start using the CHG.   CHG Shower Instructions:  Wash your face and private area with normal soap. If you choose to wash your hair,  wash first with your normal shampoo.  After you use shampoo/soap, rinse your hair and body thoroughly to remove shampoo/soap residue.  Turn the water OFF and apply about 3 tablespoons (45 ml) of CHG soap to a CLEAN washcloth.  Apply CHG soap ONLY FROM YOUR NECK DOWN TO YOUR TOES (washing for 3-5 minutes)  DO NOT use CHG soap on face, private areas, open wounds, or sores.  Pay special attention to the area where your surgery is being performed.  If you are having back surgery, having someone wash your back for you may be helpful. Wait 2 minutes after CHG soap is applied, then you may rinse off the CHG soap.  Pat dry with a clean towel  Put on clean clothes/pajamas   If you choose to wear lotion, please use ONLY the CHG-compatible lotions that are listed below.  Additional instructions for the day of surgery: DO NOT APPLY any lotions, deodorants, cologne, or perfumes.   Do not bring valuables to the hospital. [facility] is not responsible for any belongings/valuables. Do not wear nail polish, gel polish, artificial nails, or any other type of covering on natural nails (fingers and toes) Do not wear jewelry or makeup Put on clean/comfortable clothes.  Please brush your teeth.  Ask your nurse before applying any prescription medications to the skin.     CHG Compatible Lotions   Aveeno Moisturizing lotion  Cetaphil Moisturizing Cream  Cetaphil Moisturizing Lotion  Clairol Herbal Essence Moisturizing Lotion, Dry Skin  Clairol Herbal Essence Moisturizing Lotion, Extra Dry Skin  Clairol Herbal Essence Moisturizing Lotion, Normal Skin  Curel Age Defying Therapeutic Moisturizing Lotion with Alpha Hydroxy  Curel Extreme Care Body Lotion  Curel Soothing Hands Moisturizing Hand Lotion  Curel Therapeutic Moisturizing Cream, Fragrance-Free  Curel Therapeutic Moisturizing Lotion, Fragrance-Free  Curel Therapeutic Moisturizing Lotion, Original Formula  Eucerin Daily Replenishing Lotion   Eucerin Dry Skin Therapy Plus Alpha Hydroxy Crme  Eucerin Dry Skin Therapy Plus Alpha Hydroxy Lotion  Eucerin Original Crme  Eucerin Original Lotion  Eucerin Plus Crme Eucerin Plus Lotion  Eucerin TriLipid Replenishing Lotion  Keri Anti-Bacterial Hand Lotion  Keri Deep Conditioning Original Lotion Dry Skin Formula Softly Scented  Keri Deep Conditioning Original Lotion, Fragrance Free Sensitive Skin Formula  Keri Lotion Fast Absorbing Fragrance Free Sensitive Skin Formula  Keri Lotion Fast Absorbing Softly Scented Dry Skin Formula  Keri Original Lotion  Keri Skin Renewal Lotion Keri Silky Smooth Lotion  Keri Silky Smooth Sensitive Skin Lotion  Nivea Body Creamy Conditioning Oil  Nivea Body Extra Enriched Lotion  Nivea Body Original Lotion  Nivea Body Sheer Moisturizing Lotion Nivea Crme  Nivea Skin Firming Lotion  NutraDerm 30 Skin Lotion  NutraDerm Skin Lotion  NutraDerm Therapeutic Skin Cream  NutraDerm Therapeutic Skin Lotion  ProShield Protective Hand Cream  Provon moisturizing lotion  Please read over the following fact sheets that  you were given.

## 2024-02-18 ENCOUNTER — Encounter (HOSPITAL_COMMUNITY): Payer: Self-pay

## 2024-02-18 ENCOUNTER — Encounter (HOSPITAL_COMMUNITY)
Admission: RE | Admit: 2024-02-18 | Discharge: 2024-02-18 | Disposition: A | Discharge status: Home / Self Care | Source: Ambulatory Visit | Attending: Orthopaedic Surgery | Admitting: Orthopaedic Surgery

## 2024-02-18 ENCOUNTER — Other Ambulatory Visit: Payer: Self-pay

## 2024-02-18 VITALS — BP 146/91 | HR 70 | Temp 98.4°F | Resp 17 | Ht 72.0 in | Wt 181.3 lb

## 2024-02-18 DIAGNOSIS — Z01818 Encounter for other preprocedural examination: Secondary | ICD-10-CM

## 2024-02-18 DIAGNOSIS — Z01812 Encounter for preprocedural laboratory examination: Secondary | ICD-10-CM | POA: Insufficient documentation

## 2024-02-18 HISTORY — DX: Essential (primary) hypertension: I10

## 2024-02-18 LAB — TYPE AND SCREEN
ABO/RH(D): O POS
Antibody Screen: NEGATIVE

## 2024-02-18 LAB — SURGICAL PCR SCREEN
MRSA, PCR: NEGATIVE
Staphylococcus aureus: NEGATIVE

## 2024-02-18 NOTE — Progress Notes (Signed)
 PCP - Dr. [name] Cardiologist - Dr. Jari Favre  PPM/ICD - Denies Device Orders - n/a Rep Notified - n/a  Chest x-ray - n/a EKG - 04/25/23 Stress Test - Denies ECHO - Denies Cardiac Cath - Denies  Sleep Study - Denies CPAP - n/a  NON-diabetic  Last dose of GLP1 agonist-  Denies GLP1 instructions: n/a  Blood Thinner Instructions: Denies Aspirin Instructions: Denies - instructed not to take prior to surgery.  ERAS Protcol - Clears until 0700 PRE-SURGERY Ensure or G2- Ensure  COVID TEST- No   Anesthesia review: No  Patient denies shortness of breath, fever, cough and chest pain at PAT appointment. Patient denies any respiratory issues at this time.    All instructions explained to the patient, with a verbal understanding of the material. Patient agrees to go over the instructions while at home for a better understanding. Patient also instructed to self quarantine after being tested for COVID-19. The opportunity to ask questions was provided.

## 2024-02-18 NOTE — Progress Notes (Signed)
 Surgical Instructions     Your procedure is scheduled on Tuesday, April 15th, 2025. Report to [facility] Main Entrance "A" at 8:00 A.M., then check in with the Admitting office. Any questions or running late day of surgery: call [phone]   Questions prior to your surgery date: call [phone], Monday-Friday, 8am-4pm. If you experience any cold or flu symptoms such as cough, fever, chills, shortness of breath, etc. between now and your scheduled surgery, please notify us at the above number.            Remember:       Do not eat after midnight the night before your surgery   You may drink clear liquids until 7:00 the morning of your surgery.   Clear liquids allowed are: Water, Non-Citrus Juices (without pulp), Carbonated Beverages, Clear Tea (no milk, honey, etc.), Black Coffee Only (NO MILK, CREAM OR POWDERED CREAMER of any kind), and Gatorade.          Take these medicines the morning of surgery with A SIP OF WATER: Atorvastatin (Lipitor)     May take these medicines IF NEEDED: Cyclobenzaprine (Flexeril)     Patient Instructions  The night before surgery:  No food after midnight. ONLY clear liquids after midnight  The day of surgery (if you do NOT have diabetes):  Drink ONE (1) Pre-Surgery Clear Ensure by 7:00 the morning of surgery. Drink in one sitting. Do not sip.  This drink was given to you during your hospital pre-op appointment visit.  Nothing else to drink after completing the Pre-Surgery Clear Ensure.         If you have questions, please contact your surgeon's office.    One week prior to surgery, STOP taking any Aspirin (unless otherwise instructed by your surgeon) Aleve, Naproxen, Ibuprofen, Motrin, Advil, Goody's, BC's, all herbal medications, fish oil, and non-prescription vitamins.  This includes Celecoxib (Celebrex)                     Do NOT Smoke (Tobacco/Vaping) for 24 hours prior to your procedure.   If you use a CPAP at night, you may bring your  mask/headgear for your overnight stay.   You will be asked to remove any contacts, glasses, piercing's, hearing aid's, dentures/partials prior to surgery. Please bring cases for these items if needed.    Patients discharged the day of surgery will not be allowed to drive home, and someone needs to stay with them for 24 hours.   SURGICAL WAITING ROOM VISITATION Patients may have no more than 2 support people in the waiting area - these visitors may rotate.   Pre-op nurse will coordinate an appropriate time for 1 ADULT support person, who may not rotate, to accompany patient in pre-op.  Children under the age of 30 must have an adult with them who is not the patient and must remain in the main waiting area with an adult.   If the patient needs to stay at the hospital during part of their recovery, the visitor guidelines for inpatient rooms apply.   Please refer to the [facility] website for the visitor guidelines for any additional information.     If you received a COVID test during your pre-op visit  it is requested that you wear a mask when out in public, stay away from anyone that may not be feeling well and notify your surgeon if you develop symptoms. If you have been in contact with anyone that has tested positive  in the last 10 days please notify you surgeon.         Pre-operative 5 CHG Bathing Instructions    You can play a key role in reducing the risk of infection after surgery. Your skin needs to be as free of germs as possible. You can reduce the number of germs on your skin by washing with CHG (chlorhexidine gluconate) soap before surgery. CHG is an antiseptic soap that kills germs and continues to kill germs even after washing.    DO NOT use if you have an allergy to chlorhexidine/CHG or antibacterial soaps. If your skin becomes reddened or irritated, stop using the CHG and notify one of our RNs at [phone].    Please shower with the CHG soap starting 4 days before surgery  using the following schedule:       Please keep in mind the following:  DO NOT shave, including legs and underarms, starting the day of your first shower.   You may shave your face at any point before/day of surgery.  Place clean sheets on your bed the day you start using CHG soap. Use a clean washcloth (not used since being washed) for each shower. DO NOT sleep with pets once you start using the CHG.    CHG Shower Instructions:  Wash your face and private area with normal soap. If you choose to wash your hair, wash first with your normal shampoo.  After you use shampoo/soap, rinse your hair and body thoroughly to remove shampoo/soap residue.  Turn the water OFF and apply about 3 tablespoons (45 ml) of CHG soap to a CLEAN washcloth.  Apply CHG soap ONLY FROM YOUR NECK DOWN TO YOUR TOES (washing for 3-5 minutes)  DO NOT use CHG soap on face, private areas, open wounds, or sores.  Pay special attention to the area where your surgery is being performed.  If you are having back surgery, having someone wash your back for you may be helpful. Wait 2 minutes after CHG soap is applied, then you may rinse off the CHG soap.  Pat dry with a clean towel  Put on clean clothes/pajamas   If you choose to wear lotion, please use ONLY the CHG-compatible lotions that are listed below.   Additional instructions for the day of surgery: DO NOT APPLY any lotions, deodorants, cologne, or perfumes.   Do not bring valuables to the hospital. [facility] is not responsible for any belongings/valuables. Do not wear nail polish, gel polish, artificial nails, or any other type of covering on natural nails (fingers and toes) Do not wear jewelry or makeup Put on clean/comfortable clothes.  Please brush your teeth.  Ask your nurse before applying any prescription medications to the skin.        CHG Compatible Lotions    Aveeno Moisturizing lotion  Cetaphil Moisturizing Cream  Cetaphil Moisturizing Lotion   Clairol Herbal Essence Moisturizing Lotion, Dry Skin  Clairol Herbal Essence Moisturizing Lotion, Extra Dry Skin  Clairol Herbal Essence Moisturizing Lotion, Normal Skin  Curel Age Defying Therapeutic Moisturizing Lotion with Alpha Hydroxy  Curel Extreme Care Body Lotion  Curel Soothing Hands Moisturizing Hand Lotion  Curel Therapeutic Moisturizing Cream, Fragrance-Free  Curel Therapeutic Moisturizing Lotion, Fragrance-Free  Curel Therapeutic Moisturizing Lotion, Original Formula  Eucerin Daily Replenishing Lotion  Eucerin Dry Skin Therapy Plus Alpha Hydroxy Crme  Eucerin Dry Skin Therapy Plus Alpha Hydroxy Lotion  Eucerin Original Crme  Eucerin Original Lotion  Eucerin Plus Crme Eucerin Plus Lotion  Eucerin  TriLipid Replenishing Lotion  Keri Anti-Bacterial Hand Lotion  Keri Deep Conditioning Original Lotion Dry Skin Formula Softly Scented  Keri Deep Conditioning Original Lotion, Fragrance Free Sensitive Skin Formula  Keri Lotion Fast Absorbing Fragrance Free Sensitive Skin Formula  Keri Lotion Fast Absorbing Softly Scented Dry Skin Formula  Keri Original Lotion  Keri Skin Renewal Lotion Keri Silky Smooth Lotion  Keri Silky Smooth Sensitive Skin Lotion  Nivea Body Creamy Conditioning Oil  Nivea Body Extra Enriched Lotion  Nivea Body Original Lotion  Nivea Body Sheer Moisturizing Lotion Nivea Crme  Nivea Skin Firming Lotion  NutraDerm 30 Skin Lotion  NutraDerm Skin Lotion  NutraDerm Therapeutic Skin Cream  NutraDerm Therapeutic Skin Lotion  ProShield Protective Hand Cream  Provon moisturizing lotion   Please read over the following fact sheets that you were given.

## 2024-02-19 ENCOUNTER — Other Ambulatory Visit: Payer: Self-pay | Admitting: Medical

## 2024-02-23 ENCOUNTER — Telehealth: Payer: Self-pay

## 2024-02-23 NOTE — Telephone Encounter (Signed)
 Can be discussed at f/u on 02/25/24   Copied from CRM #[id number]. Topic: Clinical - Medical Advice >> Feb 20, 2024  9:45 AM Elizebeth Brooking wrote: Reason for CRM: Patient called would like to speak with PA [name] or Dahlia Client about a letter that he has received, stated it is very important and would like a callback as soon as possible

## 2024-02-25 ENCOUNTER — Ambulatory Visit (HOSPITAL_BASED_OUTPATIENT_CLINIC_OR_DEPARTMENT_OTHER)
Admission: RE | Admit: 2024-02-25 | Discharge: 2024-02-25 | Disposition: A | Discharge status: Home / Self Care | Source: Ambulatory Visit | Attending: Medical | Admitting: Medical

## 2024-02-25 ENCOUNTER — Ambulatory Visit (INDEPENDENT_AMBULATORY_CARE_PROVIDER_SITE_OTHER): Admitting: Medical

## 2024-02-25 ENCOUNTER — Encounter: Payer: Self-pay | Admitting: Medical

## 2024-02-25 VITALS — BP 118/82 | HR 84 | Temp 98.4°F | Ht 72.0 in | Wt 184.4 lb

## 2024-02-25 DIAGNOSIS — Z981 Arthrodesis status: Secondary | ICD-10-CM | POA: Diagnosis not present

## 2024-02-25 DIAGNOSIS — M48061 Spinal stenosis, lumbar region without neurogenic claudication: Secondary | ICD-10-CM | POA: Diagnosis not present

## 2024-02-25 DIAGNOSIS — M545 Low back pain, unspecified: Secondary | ICD-10-CM | POA: Insufficient documentation
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
## 2024-02-25 NOTE — Patient Instructions (Signed)
 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 Scheduled for surgery this coming tuesday. - Hip surgery on April 15th. - Follow up with orthopedic surgeon post-surgery.   Lumbar Back Pain Recent lumbar pain possibly linked to altered gait from hip pain. Previous MRI showed mild foraminal narrowing and stenosis. - Order lumbar x-ray post-hip surgery. - Consider MRI if x-ray inconclusive and pain persists. - Evaluate need for spine surgeon referral if significant changes noted.  Hematuria Intermittent hematuria with no bacterial growth. Smoking history necessitates urologist referral to evaluate and treat. - Refer to urologist for evaluation and probable cystoscopy on April 30th.  Hypertension Managed with current medications.  Hyperlipidemia Managed with current medications.  General Health Maintenance PSA levels stable, normal kidney function and liver enzymes. - Follow up with me week May 5th. - Review orthopedic and urologist notes post-surgery and evaluation.

## 2024-03-01 ENCOUNTER — Telehealth: Payer: Self-pay | Admitting: *Deleted

## 2024-03-01 NOTE — Telephone Encounter (Signed)
 Pre-op call from [facility]. Will continue to follow for needs up to 3 months post op.

## 2024-03-01 NOTE — Care Plan (Signed)
 [facility] RNCM call to patient to discuss his upcoming Left total hip arthroplasty with Dr. Lucienne Ryder on 03/02/24 at [facility]. He is agreeable to case management. His plan is to return home with assistance from his spouse after discharge. He will need a RW. Anticipate some HHPT will be needed after a short hospital stay. Referral made to [facility] after choice provided. Reviewed all post op care instructions. Will continue to follow for needs.
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
## 2024-03-02 ENCOUNTER — Observation Stay (HOSPITAL_COMMUNITY)
Admission: RE | Admit: 2024-03-02 | Discharge: 2024-03-03 | Disposition: A | Discharge status: Home / Self Care | Attending: Orthopaedic Surgery | Admitting: Orthopaedic Surgery

## 2024-03-02 ENCOUNTER — Ambulatory Visit (HOSPITAL_COMMUNITY): Admitting: Certified Registered Nurse Anesthetist

## 2024-03-02 ENCOUNTER — Encounter (HOSPITAL_COMMUNITY): Payer: Self-pay | Admitting: Orthopaedic Surgery

## 2024-03-02 ENCOUNTER — Other Ambulatory Visit: Payer: Self-pay

## 2024-03-02 ENCOUNTER — Ambulatory Visit (HOSPITAL_BASED_OUTPATIENT_CLINIC_OR_DEPARTMENT_OTHER): Admitting: Certified Registered Nurse Anesthetist

## 2024-03-02 ENCOUNTER — Observation Stay (HOSPITAL_COMMUNITY)

## 2024-03-02 ENCOUNTER — Ambulatory Visit (HOSPITAL_COMMUNITY)

## 2024-03-02 ENCOUNTER — Encounter (HOSPITAL_COMMUNITY)
Admission: RE | Disposition: A | Discharge status: Home / Self Care | Payer: Self-pay | Source: Home / Self Care | Attending: Orthopaedic Surgery

## 2024-03-02 DIAGNOSIS — M1612 Unilateral primary osteoarthritis, left hip: Principal | ICD-10-CM

## 2024-03-02 DIAGNOSIS — F17210 Nicotine dependence, cigarettes, uncomplicated: Secondary | ICD-10-CM | POA: Insufficient documentation

## 2024-03-02 DIAGNOSIS — I10 Essential (primary) hypertension: Secondary | ICD-10-CM | POA: Insufficient documentation

## 2024-03-02 DIAGNOSIS — Z96642 Presence of left artificial hip joint: Secondary | ICD-10-CM | POA: Diagnosis not present

## 2024-03-02 DIAGNOSIS — Z79899 Other long term (current) drug therapy: Secondary | ICD-10-CM | POA: Insufficient documentation

## 2024-03-02 DIAGNOSIS — Z471 Aftercare following joint replacement surgery: Secondary | ICD-10-CM | POA: Diagnosis not present

## 2024-03-02 HISTORY — PX: TOTAL HIP ARTHROPLASTY: SHX124

## 2024-03-02 LAB — ABO/RH: ABO/RH(D): O POS

## 2024-03-02 SURGERY — ARTHROPLASTY, HIP, TOTAL, ANTERIOR APPROACH
Anesthesia: Spinal | Site: Hip | Laterality: Left

## 2024-03-02 MED ORDER — HYDROMORPHONE HCL 1 MG/ML IJ SOLN
INTRAMUSCULAR | Status: AC
  Filled 2024-03-02: qty 0.5

## 2024-03-02 MED ORDER — HYDROMORPHONE HCL 1 MG/ML IJ SOLN
INTRAMUSCULAR | Status: DC | PRN
  Administered 2024-03-02: 1 mg via INTRAVENOUS

## 2024-03-02 MED ORDER — ONDANSETRON HCL 4 MG PO TABS: 4.0000 mg | ORAL_TABLET | Freq: Four times a day (QID) | ORAL | Status: DC | PRN

## 2024-03-02 MED ORDER — OXYCODONE HCL 5 MG PO TABS
5.0000 mg | ORAL_TABLET | ORAL | Status: DC | PRN
  Administered 2024-03-02: 5 mg via ORAL
  Administered 2024-03-03: 10 mg via ORAL
  Administered 2024-03-03: 5 mg via ORAL
  Filled 2024-03-02 (×3): qty 2

## 2024-03-02 MED ORDER — DEXAMETHASONE SODIUM PHOSPHATE 10 MG/ML IJ SOLN
INTRAMUSCULAR | Status: DC | PRN
  Administered 2024-03-02: 10 mg via INTRAVENOUS

## 2024-03-02 MED ORDER — POVIDONE-IODINE 10 % EX SWAB
2.0000 | Freq: Once | CUTANEOUS | Status: AC
  Administered 2024-03-02: 2 via TOPICAL

## 2024-03-02 MED ORDER — MIDAZOLAM HCL 2 MG/2ML IJ SOLN
INTRAMUSCULAR | Status: AC
  Filled 2024-03-02: qty 2

## 2024-03-02 MED ORDER — CEFAZOLIN SODIUM-DEXTROSE 2-4 GM/100ML-% IV SOLN
2.0000 g | Freq: Four times a day (QID) | INTRAVENOUS | Status: AC
  Administered 2024-03-02 – 2024-03-03 (×2): 2 g via INTRAVENOUS
  Filled 2024-03-02 (×2): qty 100

## 2024-03-02 MED ORDER — BUPIVACAINE-EPINEPHRINE 0.25% -1:200000 IJ SOLN
INTRAMUSCULAR | Status: DC | PRN
  Administered 2024-03-02: 30 mL

## 2024-03-02 MED ORDER — LOSARTAN POTASSIUM 50 MG PO TABS
25.0000 mg | ORAL_TABLET | Freq: Every day | ORAL | Status: DC
  Administered 2024-03-02 – 2024-03-03 (×2): 25 mg via ORAL
  Filled 2024-03-02 (×2): qty 1

## 2024-03-02 MED ORDER — CEFAZOLIN SODIUM-DEXTROSE 2-4 GM/100ML-% IV SOLN
2.0000 g | INTRAVENOUS | Status: AC
  Administered 2024-03-02: 2 g via INTRAVENOUS
  Filled 2024-03-02: qty 100

## 2024-03-02 MED ORDER — METOCLOPRAMIDE HCL 5 MG PO TABS: 5.0000 mg | ORAL_TABLET | Freq: Three times a day (TID) | ORAL | Status: DC | PRN

## 2024-03-02 MED ORDER — SUGAMMADEX SODIUM 200 MG/2ML IV SOLN
INTRAVENOUS | Status: DC | PRN
  Administered 2024-03-02: 200 mg via INTRAVENOUS

## 2024-03-02 MED ORDER — LACTATED RINGERS IV SOLN: INTRAVENOUS | Status: DC

## 2024-03-02 MED ORDER — 0.9 % SODIUM CHLORIDE (POUR BTL) OPTIME
TOPICAL | Status: DC | PRN
  Administered 2024-03-02: 1000 mL

## 2024-03-02 MED ORDER — MIDAZOLAM HCL 2 MG/2ML IJ SOLN
INTRAMUSCULAR | Status: DC | PRN
  Administered 2024-03-02: 2 mg via INTRAVENOUS

## 2024-03-02 MED ORDER — FENTANYL CITRATE (PF) 250 MCG/5ML IJ SOLN
INTRAMUSCULAR | Status: AC
  Filled 2024-03-02: qty 5

## 2024-03-02 MED ORDER — GABAPENTIN 300 MG PO CAPS
300.0000 mg | ORAL_CAPSULE | Freq: Every day | ORAL | Status: DC
  Administered 2024-03-02: 300 mg via ORAL
  Filled 2024-03-02: qty 1

## 2024-03-02 MED ORDER — ACETAMINOPHEN 10 MG/ML IV SOLN
1000.0000 mg | Freq: Once | INTRAVENOUS | Status: DC | PRN
  Administered 2024-03-02: 1000 mg via INTRAVENOUS
  Filled 2024-03-02: qty 100

## 2024-03-02 MED ORDER — ASPIRIN 81 MG PO CHEW
81.0000 mg | CHEWABLE_TABLET | Freq: Two times a day (BID) | ORAL | Status: DC
Start: 2024-03-02 — End: 2024-03-03
  Administered 2024-03-02 – 2024-03-03 (×2): 81 mg via ORAL
  Filled 2024-03-02 (×2): qty 1

## 2024-03-02 MED ORDER — HYDROMORPHONE HCL 1 MG/ML IJ SOLN
0.5000 mg | INTRAMUSCULAR | Status: DC | PRN
  Administered 2024-03-02: 1 mg via INTRAVENOUS
  Filled 2024-03-02: qty 1

## 2024-03-02 MED ORDER — ACETAMINOPHEN 10 MG/ML IV SOLN
INTRAVENOUS | Status: AC
  Filled 2024-03-02: qty 100

## 2024-03-02 MED ORDER — TRANEXAMIC ACID-NACL 1000-0.7 MG/100ML-% IV SOLN
1000.0000 mg | INTRAVENOUS | Status: AC
  Administered 2024-03-02: 1000 mg via INTRAVENOUS
  Filled 2024-03-02: qty 100

## 2024-03-02 MED ORDER — FENTANYL CITRATE (PF) 100 MCG/2ML IJ SOLN
25.0000 ug | INTRAMUSCULAR | Status: DC | PRN
  Administered 2024-03-02: 50 ug via INTRAVENOUS
  Administered 2024-03-02 (×2): 25 ug via INTRAVENOUS

## 2024-03-02 MED ORDER — ALUM & MAG HYDROXIDE-SIMETH 200-200-20 MG/5ML PO SUSP: 30.0000 mL | ORAL | Status: DC | PRN

## 2024-03-02 MED ORDER — SODIUM CHLORIDE 0.9 % IV SOLN: INTRAVENOUS | Status: DC

## 2024-03-02 MED ORDER — OXYCODONE HCL 5 MG PO TABS
ORAL_TABLET | ORAL | Status: AC
  Filled 2024-03-02: qty 1

## 2024-03-02 MED ORDER — ONDANSETRON HCL 4 MG/2ML IJ SOLN
INTRAMUSCULAR | Status: DC | PRN
  Administered 2024-03-02: 4 mg via INTRAVENOUS

## 2024-03-02 MED ORDER — OXYCODONE HCL 5 MG PO TABS
10.0000 mg | ORAL_TABLET | ORAL | Status: DC | PRN
Start: 2024-03-02 — End: 2024-03-03

## 2024-03-02 MED ORDER — CHLORHEXIDINE GLUCONATE 0.12 % MT SOLN
15.0000 mL | Freq: Once | OROMUCOSAL | Status: AC
  Administered 2024-03-02: 15 mL via OROMUCOSAL
  Filled 2024-03-02: qty 15

## 2024-03-02 MED ORDER — ROCURONIUM BROMIDE 10 MG/ML (PF) SYRINGE
PREFILLED_SYRINGE | INTRAVENOUS | Status: DC | PRN
  Administered 2024-03-02: 70 mg via INTRAVENOUS

## 2024-03-02 MED ORDER — HYDRALAZINE HCL 20 MG/ML IJ SOLN
INTRAMUSCULAR | Status: DC | PRN
  Administered 2024-03-02: 5 mg via INTRAVENOUS

## 2024-03-02 MED ORDER — ONDANSETRON HCL 4 MG/2ML IJ SOLN
4.0000 mg | Freq: Four times a day (QID) | INTRAMUSCULAR | Status: DC | PRN
  Filled 2024-03-02: qty 2

## 2024-03-02 MED ORDER — DROPERIDOL 2.5 MG/ML IJ SOLN: 0.6250 mg | Freq: Once | INTRAMUSCULAR | Status: DC | PRN

## 2024-03-02 MED ORDER — DOCUSATE SODIUM 100 MG PO CAPS
100.0000 mg | ORAL_CAPSULE | Freq: Two times a day (BID) | ORAL | Status: DC
Start: 2024-03-02 — End: 2024-03-03
  Filled 2024-03-02 (×2): qty 1

## 2024-03-02 MED ORDER — OXYCODONE HCL 5 MG PO TABS
5.0000 mg | ORAL_TABLET | Freq: Once | ORAL | Status: AC | PRN
  Administered 2024-03-02: 5 mg via ORAL

## 2024-03-02 MED ORDER — BUPIVACAINE-EPINEPHRINE (PF) 0.25% -1:200000 IJ SOLN
INTRAMUSCULAR | Status: AC
  Filled 2024-03-02: qty 30

## 2024-03-02 MED ORDER — FENTANYL CITRATE (PF) 250 MCG/5ML IJ SOLN
INTRAMUSCULAR | Status: DC | PRN
Start: 2024-03-02 — End: 2024-03-02
  Administered 2024-03-02 (×3): 50 ug via INTRAVENOUS
  Administered 2024-03-02: 100 ug via INTRAVENOUS

## 2024-03-02 MED ORDER — SODIUM CHLORIDE 0.9 % IR SOLN
Status: DC | PRN
  Administered 2024-03-02: 1000 mL

## 2024-03-02 MED ORDER — PHENOL 1.4 % MT LIQD: 1.0000 | OROMUCOSAL | Status: DC | PRN

## 2024-03-02 MED ORDER — TIZANIDINE HCL 4 MG PO TABS: 4.0000 mg | ORAL_TABLET | Freq: Four times a day (QID) | ORAL | Status: DC | PRN

## 2024-03-02 MED ORDER — FENTANYL CITRATE (PF) 100 MCG/2ML IJ SOLN
INTRAMUSCULAR | Status: AC
  Filled 2024-03-02: qty 2

## 2024-03-02 MED ORDER — POLYETHYLENE GLYCOL 3350 17 G PO PACK: 17.0000 g | PACK | Freq: Every day | ORAL | Status: DC | PRN

## 2024-03-02 MED ORDER — ACETAMINOPHEN 325 MG PO TABS
325.0000 mg | ORAL_TABLET | Freq: Four times a day (QID) | ORAL | Status: DC | PRN
  Administered 2024-03-03: 650 mg via ORAL
  Filled 2024-03-02: qty 2

## 2024-03-02 MED ORDER — MENTHOL 3 MG MT LOZG: 1.0000 | LOZENGE | OROMUCOSAL | Status: DC | PRN

## 2024-03-02 MED ORDER — ORAL CARE MOUTH RINSE: 15.0000 mL | Freq: Once | OROMUCOSAL | Status: AC

## 2024-03-02 MED ORDER — PANTOPRAZOLE SODIUM 40 MG PO TBEC
40.0000 mg | DELAYED_RELEASE_TABLET | Freq: Every day | ORAL | Status: DC
  Administered 2024-03-02 – 2024-03-03 (×2): 40 mg via ORAL
  Filled 2024-03-02 (×2): qty 1

## 2024-03-02 MED ORDER — METOCLOPRAMIDE HCL 5 MG/ML IJ SOLN: 5.0000 mg | Freq: Three times a day (TID) | INTRAMUSCULAR | Status: DC | PRN

## 2024-03-02 MED ORDER — OXYCODONE HCL 5 MG/5ML PO SOLN: 5.0000 mg | Freq: Once | ORAL | Status: AC | PRN

## 2024-03-02 MED ORDER — PROPOFOL 10 MG/ML IV BOLUS
INTRAVENOUS | Status: DC | PRN
  Administered 2024-03-02: 40 mg via INTRAVENOUS
  Administered 2024-03-02: 160 mg via INTRAVENOUS

## 2024-03-02 SURGICAL SUPPLY — 45 items
BAG COUNTER SPONGE SURGICOUNT (BAG) ×1 IMPLANT
BENZOIN TINCTURE PRP APPL 2/3 (GAUZE/BANDAGES/DRESSINGS) ×1 IMPLANT
BLADE CLIPPER SURG (BLADE) IMPLANT
BLADE SAW SGTL 18X1.27X75 (BLADE) ×1 IMPLANT
COVER SURGICAL LIGHT HANDLE (MISCELLANEOUS) ×1 IMPLANT
CUP SECTOR GRIPTON 58MM (Orthopedic Implant) IMPLANT
DRAPE C-ARM 42X72 X-RAY (DRAPES) ×1 IMPLANT
DRAPE STERI IOBAN 125X83 (DRAPES) ×1 IMPLANT
DRAPE U-SHAPE 47X51 STRL (DRAPES) ×3 IMPLANT
DRSG AQUACEL AG ADV 3.5X10 (GAUZE/BANDAGES/DRESSINGS) ×1 IMPLANT
DURAPREP 26ML APPLICATOR (WOUND CARE) ×1 IMPLANT
ELECT BLADE 4.0 EZ CLEAN MEGAD (MISCELLANEOUS) ×1 IMPLANT
ELECT BLADE 6.5 EXT (BLADE) IMPLANT
ELECT REM PT RETURN 9FT ADLT (ELECTROSURGICAL) ×1 IMPLANT
ELECTRODE BLDE 4.0 EZ CLN MEGD (MISCELLANEOUS) ×1 IMPLANT
ELECTRODE REM PT RTRN 9FT ADLT (ELECTROSURGICAL) ×1 IMPLANT
FACESHIELD WRAPAROUND (MASK) ×2 IMPLANT
FACESHIELD WRAPAROUND OR TEAM (MASK) ×2 IMPLANT
GLOVE BIOGEL PI IND STRL 8 (GLOVE) ×2 IMPLANT
GLOVE ECLIPSE 8.0 STRL XLNG CF (GLOVE) ×1 IMPLANT
GLOVE ORTHO TXT STRL SZ7.5 (GLOVE) ×2 IMPLANT
GOWN STRL REUS W/ TWL LRG LVL3 (GOWN DISPOSABLE) ×2 IMPLANT
GOWN STRL REUS W/ TWL XL LVL3 (GOWN DISPOSABLE) ×2 IMPLANT
HEAD CERAMIC 36 PLUS 8.5 12 14 (Hips) IMPLANT
KIT BASIN OR (CUSTOM PROCEDURE TRAY) ×1 IMPLANT
KIT TURNOVER KIT B (KITS) ×1 IMPLANT
LINER NEUTRAL 36X58 PLUS4 IMPLANT
MANIFOLD NEPTUNE II (INSTRUMENTS) ×1 IMPLANT
NS IRRIG 1000ML POUR BTL (IV SOLUTION) ×1 IMPLANT
PACK TOTAL JOINT (CUSTOM PROCEDURE TRAY) ×1 IMPLANT
PAD ARMBOARD POSITIONER FOAM (MISCELLANEOUS) ×1 IMPLANT
SET HNDPC FAN SPRY TIP SCT (DISPOSABLE) ×1 IMPLANT
STAPLER VISISTAT 35W (STAPLE) IMPLANT
STEM FEMORAL SZ6 HIGH ACTIS (Stem) IMPLANT
STRIP CLOSURE SKIN 1/2X4 (GAUZE/BANDAGES/DRESSINGS) ×2 IMPLANT
SUT ETHIBOND NAB CT1 #1 30IN (SUTURE) ×1 IMPLANT
SUT MNCRL AB 4-0 PS2 18 (SUTURE) IMPLANT
SUT VIC AB 0 CT1 27XBRD ANBCTR (SUTURE) ×1 IMPLANT
SUT VIC AB 1 CT1 27XBRD ANBCTR (SUTURE) ×1 IMPLANT
SUT VIC AB 2-0 CT1 TAPERPNT 27 (SUTURE) ×1 IMPLANT
TOWEL GREEN STERILE (TOWEL DISPOSABLE) ×1 IMPLANT
TOWEL GREEN STERILE FF (TOWEL DISPOSABLE) ×1 IMPLANT
TRAY CATH INTERMITTENT SS 16FR (CATHETERS) IMPLANT
TRAY FOLEY W/BAG SLVR 16FR ST (SET/KITS/TRAYS/PACK) IMPLANT
WATER STERILE IRR 1000ML POUR (IV SOLUTION) ×2 IMPLANT

## 2024-03-02 NOTE — Anesthesia Postprocedure Evaluation (Signed)
 Anesthesia Post Note  Patient: Cory Benson  Procedure(s) Performed: LEFT TOTAL HIP ARTHOPLASTY (Left: Hip)     Patient location during evaluation: PACU Anesthesia Type: Spinal Level of consciousness: awake and alert Pain management: pain level controlled Vital Signs Assessment: post-procedure vital signs reviewed and stable Respiratory status: spontaneous breathing, nonlabored ventilation, respiratory function stable and patient connected to nasal cannula oxygen Cardiovascular status: blood pressure returned to baseline and stable Postop Assessment: no apparent nausea or vomiting Anesthetic complications: no   No notable events documented.  Last Vitals:  Vitals:   03/02/24 1345 03/02/24 1400  BP: (!) 133/90 (!) 147/94  Pulse: 89 87  Resp: (!) 39 11  Temp:  36.4 C  SpO2: 93% 94%    Last Pain:  Vitals:   03/02/24 1400  PainSc: Asleep    LLE Motor Response: Purposeful movement (03/02/24 1400) LLE Sensation: Full sensation (03/02/24 1400)          Lethaniel Rave
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
## 2024-03-02 NOTE — Transfer of Care (Signed)
 Immediate Anesthesia Transfer of Care Note  Patient: Cory Benson  Procedure(s) Performed: LEFT TOTAL HIP ARTHOPLASTY (Left: Hip)  Patient Location: PACU  Anesthesia Type:General  Level of Consciousness: awake, alert , and oriented  Airway & Oxygen Therapy: Patient Spontanous Breathing and Patient connected to face mask oxygen  Post-op Assessment: Report given to RN and Post -op Vital signs reviewed and stable  Post vital signs: Reviewed and stable  Last Vitals:  Vitals Value Taken Time  BP 176/104 03/02/24 1251  Temp    Pulse 100 03/02/24 1257  Resp 17 03/02/24 1257  SpO2 97 % 03/02/24 1257  Vitals shown include unfiled device data.  Last Pain:  Vitals:   03/02/24 0830  PainSc: 0-No pain         Complications: No notable events documented.

## 2024-03-02 NOTE — Anesthesia Procedure Notes (Signed)
 Procedure Name: Intubation Date/Time: 03/02/2024 11:17 AM  Performed by: Derral Flick, CRNAPre-anesthesia Checklist: Patient identified, Emergency Drugs available, Suction available and Patient being monitored Patient Re-evaluated:Patient Re-evaluated prior to induction Oxygen Delivery Method: Circle system utilized Preoxygenation: Pre-oxygenation with 100% oxygen Induction Type: IV induction Ventilation: Mask ventilation without difficulty Laryngoscope Size: Mac and 4 Grade View: Grade I Tube type: Oral Tube size: 7.5 mm Number of attempts: 1 Airway Equipment and Method: Stylet and Oral airway Placement Confirmation: ETT inserted through vocal cords under direct vision, positive ETCO2 and breath sounds checked- equal and bilateral Secured at: 21 cm Tube secured with: Tape Dental Injury: Teeth and Oropharynx as per pre-operative assessment

## 2024-03-02 NOTE — Op Note (Signed)
 Operative Note  Date of operation: 03/02/2024 Preoperative diagnosis: Left hip primary osteoarthritis Postoperative diagnosis: Same  Procedure: Left direct anterior total hip arthroplasty  Implants: Implant Name Type Inv. Item Serial No. Manufacturer Lot No. LRB No. Used Action  CUP SECTOR GRIPTON - [id number] Orthopedic Implant CUP SECTOR GRIPTON  DEPUY ORTH[employer] [id number] Left 1 Implanted  LINER NEUTRAL 36X58 PLUS4 - [id number]  LINER NEUTRAL 36X58 PLUS4  DEPUY ORTHOPAEDICS [id number] Left 1 Implanted  STEM FEMORAL SZ6 HIGH ACTIS - [id number] Stem STEM FEMORAL SZ6 HIGH ACTIS  DEPUY ORTHOPAEDICS [id number] Left 1 Implanted  HEAD CERAMIC 36 PLUS 8.5 12 14  - [id number] Hips HEAD CERAMIC 36 PLUS 8.5 12 14   DEPUY ORTHOPAEDICS [id number] Left 1 Implanted   Surgeon: Vanita Panda. Magnus Ivan, MD Assistant: Rexene Edison, [name]  Anesthesia: General and local EBL: 200 cc Antibiotics: IV Ancef Complications: None  Indications: The patient is a 58 year old gentleman with well-documented debilitating arthritis involving his left hip.  His x-rays show bone-on-bone wear.  He has daily and significant left hip pain that is detrimentally affecting his mobility, his quality life and his activities of daily living.  This has been getting worse for several years now and he does at this point wish to proceed with a total hip arthroplasty on the left side having failed conservative treatment and given his x-ray findings and continued pain.  We did discuss the risks of acute blood loss anemia, nerve or vessel injury, fracture, infection, DVT, implant failure, dislocation, leg length differences and wound healing issues.  He understands that our goals are hopefully decreased pain, improved mobility, and improved quality of life.  Procedure description: After informed consent was obtained and the appropriate left hip was marked, the patient was brought to the operating room and general anesthesia was obtained while  he was on the stretcher.  Traction boots were next placed on both his feet and he was then placed supine on the Hana fracture table with a perineal post in place in both legs in inline skeletal traction devices but no traction applied.  His left operative hip and pelvis were assessed radiographically.  The left hip was prepped and draped with DuraPrep and sterile drapes.  A timeout was called and he was identified as the correct patient and the correct left hip.  An incision was then made just inferior and posterior to the ASIS and carried slightly obliquely down the leg.  Dissection was carried down to the tensor fascia lata muscle and the tensor fascia was then divided longitudinally to proceed with a direct interposed the hip.  Circumflex vessels were identified cauterized.  The hip capsule identified and opened up in L-type format.  Cobra retractors were then placed around the medial and lateral femoral neck and a femoral neck cut was made with an oscillating saw just proximal to the lesser trochanter and this cut was completed with an osteotome.  A corkscrew guide was placed in the femoral head and the femoral head was removed in its entirety finding a wide area devoid of cartilage.  A bent Hohmann was then placed over the medial acetabular rim and remnants of the acetabular labrum and other debris removed.  Reaming was then initiated from a size 43 reamer and stepwise increments going all the way up to a size 57 reamer with all reamers placed under direct visualization and the last reamer also placed under direct fluoroscopy in order to obtain the depth of reaming, the inclination and the anteversion.  The real  DePuy Sectra GRIPTION acetabular component size 58 was then placed without difficulty followed by a 36+4 polythene liner based on the patient's offset.  Attention was then turned to the femur.  With the left leg externally rotated to 120 degrees, extended and adducted, a Mueller retractors placed  medially and a Hohmann retractor was placed behind the greater trochanter.  The lateral joint capsule was released and a box cutting osteotome was used into the femoral canal.  Broaching was then initiated using the Actis broaching system from a size 0 going up to a size 6.  With a size 6 in place we trialed a standard offset femoral neck and a 36+1.5 trial hip ball.  The left leg was brought over and up and with traction and internal rotation reduced in the pelvis and based on radiographic and clinical assessment we needed more offset and leg length.  We dislocated the hip and removed the trial components.  We then placed the real Actis femoral component with high offset size 6 and the real 36+8.5 ceramic head ball.  Again this reduced in the pelvis and it was very tight and we are pleased with range of motion and stability as well as radiographic assessment.  The soft tissue was then irrigated with normal saline solution.  Local with Marcaine and epinephrine was placed around the arthrotomy and the soft tissue.  The joint capsule was then closed with interrupted #1 Ethibond suture followed by #1 Vicryl to close the tensor fascia.  0 Vicryl is used to goes the deep tissue and 2-0 Vicryl was used to close subcutaneous tissue.  Skin was closed with staples.  An Aquacel dressing was applied.  The patient was taken off of the Hana table, awakened, extubated and taken to the recovery room.

## 2024-03-02 NOTE — Anesthesia Preprocedure Evaluation (Addendum)
 Anesthesia Evaluation  Patient identified by MRN, date of birth, ID band Patient awake    Reviewed: Allergy & Precautions, H&P , NPO status , Patient's Chart, lab work & pertinent test results  Airway Mallampati: II  TM Distance: >3 FB Neck ROM: Full    Dental no notable dental hx.    Pulmonary Current Smoker and Patient abstained from smoking.   Pulmonary exam normal breath sounds clear to auscultation       Cardiovascular hypertension, Normal cardiovascular exam Rhythm:Regular Rate:Normal     Neuro/Psych Lumbar spinal stenosis  negative psych ROS   GI/Hepatic negative GI ROS, Neg liver ROS,,,  Endo/Other  negative endocrine ROS    Renal/GU negative Renal ROS  negative genitourinary   Musculoskeletal  (+) Arthritis ,    Abdominal   Peds negative pediatric ROS (+)  Hematology negative hematology ROS (+)   Anesthesia Other Findings   Reproductive/Obstetrics negative OB ROS                              Anesthesia Physical Anesthesia Plan  ASA: 2  Anesthesia Plan: Spinal   Post-op Pain Management: Ofirmev IV (intra-op)*   Induction:   PONV Risk Score and Plan: Treatment may vary due to age or medical condition  Airway Management Planned: Natural Airway  Additional Equipment:   Intra-op Plan:   Post-operative Plan:   Informed Consent: I have reviewed the patients History and Physical, chart, labs and discussed the procedure including the risks, benefits and alternatives for the proposed anesthesia with the patient or authorized representative who has indicated his/her understanding and acceptance.     Dental advisory given  Plan Discussed with: CRNA  Anesthesia Plan Comments:          Anesthesia Quick Evaluation

## 2024-03-02 NOTE — Interval H&P Note (Signed)
 History and Physical Interval Note: The patient understands that he is here today for a left total hip replacement to treat significant left hip pain and arthritis.  There has been no acute or interval change in his medical status.  The risks and benefits of surgery have been discussed in detail and informed consent has been obtained.  The left operative hip has been marked.  03/02/2024 8:55 AM  Cory Benson  has presented today for surgery, with the diagnosis of Osteoarthritis Left Hip.  The various methods of treatment have been discussed with the patient and family. After consideration of risks, benefits and other options for treatment, the patient has consented to  Procedure(s): ARTHROPLASTY, HIP, TOTAL, ANTERIOR APPROACH (Left) as a surgical intervention.  The patient's history has been reviewed, patient examined, no change in status, stable for surgery.  I have reviewed the patient's chart and labs.  Questions were answered to the patient's satisfaction.     Cory Benson

## 2024-03-03 ENCOUNTER — Other Ambulatory Visit: Payer: Self-pay | Admitting: Medical

## 2024-03-03 ENCOUNTER — Encounter (HOSPITAL_COMMUNITY): Payer: Self-pay | Admitting: Orthopaedic Surgery

## 2024-03-03 DIAGNOSIS — Z96642 Presence of left artificial hip joint: Secondary | ICD-10-CM | POA: Diagnosis not present

## 2024-03-03 DIAGNOSIS — Z79899 Other long term (current) drug therapy: Secondary | ICD-10-CM | POA: Diagnosis not present

## 2024-03-03 DIAGNOSIS — M1612 Unilateral primary osteoarthritis, left hip: Secondary | ICD-10-CM | POA: Diagnosis not present

## 2024-03-03 DIAGNOSIS — F17210 Nicotine dependence, cigarettes, uncomplicated: Secondary | ICD-10-CM | POA: Diagnosis not present

## 2024-03-03 DIAGNOSIS — M67912 Unspecified disorder of synovium and tendon, left shoulder: Secondary | ICD-10-CM | POA: Diagnosis not present

## 2024-03-03 DIAGNOSIS — I10 Essential (primary) hypertension: Secondary | ICD-10-CM | POA: Diagnosis not present

## 2024-03-03 LAB — CBC
HCT: 33.2 % — ABNORMAL LOW (ref 39.0–52.0)
Hemoglobin: 11.7 g/dL — ABNORMAL LOW (ref 13.0–17.0)
MCH: 31.2 pg (ref 26.0–34.0)
MCHC: 35.2 g/dL (ref 30.0–36.0)
MCV: 88.5 fL (ref 80.0–100.0)
Platelets: 175 10*3/uL (ref 150–400)
RBC: 3.75 MIL/uL — ABNORMAL LOW (ref 4.22–5.81)
RDW: 13.8 % (ref 11.5–15.5)
WBC: 12.1 10*3/uL — ABNORMAL HIGH (ref 4.0–10.5)
nRBC: 0 % (ref 0.0–0.2)

## 2024-03-03 LAB — BASIC METABOLIC PANEL WITH GFR
Anion gap: 6 (ref 5–15)
BUN: 10 mg/dL (ref 6–20)
CO2: 24 mmol/L (ref 22–32)
Calcium: 8.6 mg/dL — ABNORMAL LOW (ref 8.9–10.3)
Chloride: 102 mmol/L (ref 98–111)
Creatinine, Ser: 1.03 mg/dL (ref 0.61–1.24)
GFR, Estimated: >60 mL/min (ref >60)
Glucose, Bld: 129 mg/dL — ABNORMAL HIGH (ref 70–99)
Potassium: 3.8 mmol/L (ref 3.5–5.1)
Sodium: 132 mmol/L — ABNORMAL LOW (ref 135–145)

## 2024-03-03 MED ORDER — CYCLOBENZAPRINE HCL 10 MG PO TABS: 10.0000 mg | ORAL_TABLET | Freq: Three times a day (TID) | ORAL | 1 refills | Status: AC | PRN

## 2024-03-03 MED ORDER — ASPIRIN 81 MG PO CHEW: 81.0000 mg | CHEWABLE_TABLET | Freq: Two times a day (BID) | ORAL | 0 refills | Status: AC

## 2024-03-03 MED ORDER — OXYCODONE HCL 5 MG PO TABS
5.0000 mg | ORAL_TABLET | Freq: Four times a day (QID) | ORAL | 0 refills | Status: DC | PRN
Start: 2024-03-03 — End: 2024-03-08

## 2024-03-03 MED ORDER — CELECOXIB 100 MG PO CAPS: 100.0000 mg | ORAL_CAPSULE | Freq: Two times a day (BID) | ORAL | 0 refills | Status: AC | PRN

## 2024-03-03 NOTE — Progress Notes (Signed)
 Discharge instructions reviewed with pt and his wife.  Copy of instructions given to pt. Pt informed his scripts were sent to his pharmacy for pick up. Pt will be d/c'd via wheelchair with belongings, with wife and  will be escorted by staff.   Adysen Raphael,RN SWOT
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
## 2024-03-03 NOTE — Progress Notes (Signed)
 Subjective: 1 Day Post-Op Procedure(s) (LRB): LEFT TOTAL HIP ARTHOPLASTY (Left) Patient reports pain as moderate.    Objective: Vital signs in last 24 hours: Temp:  [97.5 F (36.4 C)-100 F (37.8 C)] 100 F (37.8 C) (04/16 0537) Pulse Rate:  [59-89] 77 (04/16 0537) Resp:  [11-39] 17 (04/16 0537) BP: (122-176)/(80-105) 129/80 (04/16 0537) SpO2:  [93 %-100 %] 99 % (04/16 0537) Weight:  [83.9 kg] 83.9 kg (04/15 0819)  Intake/Output from previous day: 04/15 0701 - 04/16 0700 In: 1356.6 [I.V.:1256.6; IV Piggyback:100] Out: 900 [Urine:700; Blood:200] Intake/Output this shift: No intake/output data recorded.  Recent Labs    03/03/24 0616  HGB 11.7*   Recent Labs    03/03/24 0616  WBC 12.1*  RBC 3.75*  HCT 33.2*  PLT 175   Recent Labs    03/03/24 0616  NA 132*  K 3.8  CL 102  CO2 24  BUN 10  CREATININE 1.03  GLUCOSE 129*  CALCIUM 8.6*   No results for input(s): "LABPT", "INR" in the last 72 hours.  Sensation intact distally Intact pulses distally Dorsiflexion/Plantar flexion intact Incision: scant drainage   Assessment/Plan: 1 Day Post-Op Procedure(s) (LRB): LEFT TOTAL HIP ARTHOPLASTY (Left) Up with therapy Discharge home with home health this afternoon.      Cory Benson 03/03/2024, 7:33 AM
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
## 2024-03-03 NOTE — Discharge Instructions (Signed)
 Per [facility] clinic policy, our goal is ensure optimal postoperative pain control with a multimodal pain management strategy. For all [facility] patients, our goal is to wean post-operative narcotic medications by 6 weeks post-operatively. If this is not possible due to utilization of pain medication prior to surgery, your [facility] doctor will support your acute post-operative pain control for the first 6 weeks postoperatively, with a plan to transition you back to your primary pain team following that. Cory Benson will work to ensure a Therapist, occupational.  INSTRUCTIONS AFTER JOINT REPLACEMENT   Remove items at home which could result in a fall. This includes throw rugs or furniture in walking pathways ICE to the affected joint every three hours while awake for 30 minutes at a time, for at least the first 3-5 days, and then as needed for pain and swelling.  Continue to use ice for pain and swelling. You may notice swelling that will progress down to the foot and ankle.  This is normal after surgery.  Elevate your leg when you are not up walking on it.   Continue to use the breathing machine you got in the hospital (incentive spirometer) which will help keep your temperature down.  It is common for your temperature to cycle up and down following surgery, especially at night when you are not up moving around and exerting yourself.  The breathing machine keeps your lungs expanded and your temperature down.   DIET:  As you were doing prior to hospitalization, we recommend a well-balanced diet.  DRESSING / WOUND CARE / SHOWERING  Keep the surgical dressing until follow up.  The dressing is water proof, so you can shower without any extra covering.  IF THE DRESSING FALLS OFF or the wound gets wet inside, change the dressing with sterile gauze.  Please use good hand washing techniques before changing the dressing.  Do not use any lotions or creams on the incision until instructed by your surgeon.     ACTIVITY  Increase activity slowly as tolerated, but follow the weight bearing instructions below.   No driving for 6 weeks or until further direction given by your physician.  You cannot drive while taking narcotics.  No lifting or carrying greater than 10 lbs. until further directed by your surgeon. Avoid periods of inactivity such as sitting longer than an hour when not asleep. This helps prevent blood clots.  You may return to work once you are authorized by your doctor.     WEIGHT BEARING   Weight bearing as tolerated with assist device (walker, cane, etc) as directed, use it as long as suggested by your surgeon or therapist, typically at least 4-6 weeks.   EXERCISES  Results after joint replacement surgery are often greatly improved when you follow the exercise, range of motion and muscle strengthening exercises prescribed by your doctor. Safety measures are also important to protect the joint from further injury. Any time any of these exercises cause you to have increased pain or swelling, decrease what you are doing until you are comfortable again and then slowly increase them. If you have problems or questions, call your caregiver or physical therapist for advice.   Rehabilitation is important following a joint replacement. After just a few days of immobilization, the muscles of the leg can become weakened and shrink (atrophy).  These exercises are designed to build up the tone and strength of the thigh and leg muscles and to improve motion. Often times heat used for twenty to thirty minutes before  working out will loosen up your tissues and help with improving the range of motion but do not use heat for the first two weeks following surgery (sometimes heat can increase post-operative swelling).   These exercises can be done on a training (exercise) mat, on the floor, on a table or on a bed. Use whatever works the best and is most comfortable for you.    Use music or television  while you are exercising so that the exercises are a pleasant break in your day. This will make your life better with the exercises acting as a break in your routine that you can look forward to.   Perform all exercises about fifteen times, three times per day or as directed.  You should exercise both the operative leg and the other leg as well.  Exercises include:   Quad Sets - Tighten up the muscle on the front of the thigh (Quad) and hold for 5-10 seconds.   Straight Leg Raises - With your knee straight (if you were given a brace, keep it on), lift the leg to 60 degrees, hold for 3 seconds, and slowly lower the leg.  Perform this exercise against resistance later as your leg gets stronger.  Leg Slides: Lying on your back, slowly slide your foot toward your buttocks, bending your knee up off the floor (only go as far as is comfortable). Then slowly slide your foot back down until your leg is flat on the floor again.  Angel Wings: Lying on your back spread your legs to the side as far apart as you can without causing discomfort.  Hamstring Strength:  Lying on your back, push your heel against the floor with your leg straight by tightening up the muscles of your buttocks.  Repeat, but this time bend your knee to a comfortable angle, and push your heel against the floor.  You may put a pillow under the heel to make it more comfortable if necessary.   A rehabilitation program following joint replacement surgery can speed recovery and prevent re-injury in the future due to weakened muscles. Contact your doctor or a physical therapist for more information on knee rehabilitation.    CONSTIPATION  Constipation is defined medically as fewer than three stools per week and severe constipation as less than one stool per week.  Even if you have a regular bowel pattern at home, your normal regimen is likely to be disrupted due to multiple reasons following surgery.  Combination of anesthesia, postoperative  narcotics, change in appetite and fluid intake all can affect your bowels.   YOU MUST use at least one of the following options; they are listed in order of increasing strength to get the job done.  They are all available over the counter, and you may need to use some, POSSIBLY even all of these options:    Drink plenty of fluids (prune juice may be helpful) and high fiber foods Colace 100 mg by mouth twice a day  Senokot for constipation as directed and as needed Dulcolax (bisacodyl), take with full glass of water  Miralax (polyethylene glycol) once or twice a day as needed.  If you have tried all these things and are unable to have a bowel movement in the first 3-4 days after surgery call either your surgeon or your primary doctor.    If you experience loose stools or diarrhea, hold the medications until you stool forms back up.  If your symptoms do not get better within 1 week  or if they get worse, check with your doctor.  If you experience "the worst abdominal pain ever" or develop nausea or vomiting, please contact the office immediately for further recommendations for treatment.   ITCHING:  If you experience itching with your medications, try taking only a single pain pill, or even half a pain pill at a time.  You can also use Benadryl over the counter for itching or also to help with sleep.   TED HOSE STOCKINGS:  Use stockings on both legs until for at least 2 weeks or as directed by physician office. They may be removed at night for sleeping.  MEDICATIONS:  See your medication summary on the "After Visit Summary" that nursing will review with you.  You may have some home medications which will be placed on hold until you complete the course of blood thinner medication.  It is important for you to complete the blood thinner medication as prescribed.  PRECAUTIONS:  If you experience chest pain or shortness of breath - call 911 immediately for transfer to the hospital emergency department.    If you develop a fever greater that 101 F, purulent drainage from wound, increased redness or drainage from wound, foul odor from the wound/dressing, or calf pain - CONTACT YOUR SURGEON.                                                   FOLLOW-UP APPOINTMENTS:  If you do not already have a post-op appointment, please call the office for an appointment to be seen by your surgeon.  Guidelines for how soon to be seen are listed in your "After Visit Summary", but are typically between 1-4 weeks after surgery.  OTHER INSTRUCTIONS:   Knee Replacement:  Do not place pillow under knee, focus on keeping the knee straight while resting. CPM instructions: 0-90 degrees, 2 hours in the morning, 2 hours in the afternoon, and 2 hours in the evening. Place foam block, curve side up under heel at all times except when in CPM or when walking.  DO NOT modify, tear, cut, or change the foam block in any way.  POST-OPERATIVE OPIOID TAPER INSTRUCTIONS: It is important to wean off of your opioid medication as soon as possible. If you do not need pain medication after your surgery it is ok to stop day one. Opioids include: Codeine, Hydrocodone(Norco, Vicodin), Oxycodone(Percocet, oxycontin) and hydromorphone amongst others.  Long term and even short term use of opiods can cause: Increased pain response Dependence Constipation Depression Respiratory depression And more.  Withdrawal symptoms can include Flu like symptoms Nausea, vomiting And more Techniques to manage these symptoms Hydrate well Eat regular healthy meals Stay active Use relaxation techniques(deep breathing, meditating, yoga) Do Not substitute Alcohol to help with tapering If you have been on opioids for less than two weeks and do not have pain than it is ok to stop all together.  Plan to wean off of opioids This plan should start within one week post op of your joint replacement. Maintain the same interval or time between taking each dose  and first decrease the dose.  Cut the total daily intake of opioids by one tablet each day Next start to increase the time between doses. The last dose that should be eliminated is the evening dose.   MAKE SURE YOU:  Understand these instructions.  Get help right away if you are not doing well or get worse.    Thank you for letting us be a part of your medical care team.  It is a privilege we respect greatly.  We hope these instructions will help you stay on track for a fast and full recovery!      Dental Antibiotics:  In most cases prophylactic antibiotics for Dental procdeures after total joint surgery are not necessary.  Exceptions are as follows:  1. History of prior total joint infection  2. Severely immunocompromised (Organ Transplant, cancer chemotherapy, Rheumatoid biologic meds such as Humera)  3. Poorly controlled diabetes (A1C &gt; 8.0, blood glucose over 200)  If you have one of these conditions, contact your surgeon for an antibiotic prescription, prior to your dental procedure.

## 2024-03-03 NOTE — TOC Transition Note (Signed)
 Transition of Care [facility]) - Discharge Note   Patient Details  Name: Cory Benson MRN: [id number] Date of Birth: 17-Oct-1966  Transition of Care [facility] CM/SW Contact:  Omie Bickers, RN Phone Number: 03/03/2024, 8:03 AM   Clinical Narrative:     HH pre arranged by office to [facility]. Hospital liaison for [facility] notified of DC today. Spoke w patient and he would like both RW and BSC. These have been ordered through [employer] to be delivered to the room. No other TOC needs identified.   Final next level of care: Home w Home Health Services Barriers to Discharge: No Barriers Identified   Patient Goals and CMS Choice Patient states their goals for this hospitalization and ongoing recovery are:: to go home CMS Medicare.gov Compare Post Acute Care list provided to::  (pre assigned by office)        Discharge Placement                       Discharge Plan and Services Additional resources added to the After Visit Summary for                  DME Arranged: 3-N-1, Walker rolling DME Agency: [employer] Date DME Agency Contacted: 03/03/24 Time DME Agency Contacted: [id number] Representative spoke with at DME Agency: Aureliano Blow Agency: [facility] ([facility]) Date HH Agency Contacted: 03/03/24 Time HH Agency Contacted: [id number] Representative spoke with at [facility] Agency: Renetta Carter  Social Drivers of Health (SDOH) Interventions SDOH Screenings   Food Insecurity: No Food Insecurity (03/02/2024)  Housing: Low Risk  (03/02/2024)  Transportation Needs: No Transportation Needs (03/02/2024)  Utilities: Not At Risk (03/02/2024)  Alcohol Screen: Low Risk  (09/26/2021)  Depression (PHQ2-9): Low Risk  (09/27/2022)  Financial Resource Strain: Low Risk  (01/22/2022)  Physical Activity: Insufficiently Active (09/26/2021)  Social Connections: Unknown (03/25/2022)   Received from [facility]  Stress: No Stress Concern Present (09/26/2021)  Tobacco Use: High Risk  (03/02/2024)     Readmission Risk Interventions     No data to display

## 2024-03-03 NOTE — Telephone Encounter (Signed)
Rx celebrex sent to pt pharmacy.

## 2024-03-04 ENCOUNTER — Telehealth: Payer: Self-pay | Admitting: Orthopaedic Surgery

## 2024-03-04 DIAGNOSIS — Z471 Aftercare following joint replacement surgery: Secondary | ICD-10-CM | POA: Diagnosis not present

## 2024-03-04 NOTE — Telephone Encounter (Signed)
 Verbal order given

## 2024-03-04 NOTE — Telephone Encounter (Signed)
 Cory Benson from [facility] has verbal orders. 1wk 8, home health PT. That's to work on Hospital doctor and balance. CB#[phone]

## 2024-03-08 ENCOUNTER — Telehealth: Payer: Self-pay | Admitting: *Deleted

## 2024-03-08 ENCOUNTER — Other Ambulatory Visit: Payer: Self-pay | Admitting: Orthopaedic Surgery

## 2024-03-08 MED ORDER — OXYCODONE HCL 5 MG PO TABS: 5.0000 mg | ORAL_TABLET | Freq: Four times a day (QID) | ORAL | 0 refills | Status: DC | PRN

## 2024-03-08 NOTE — Telephone Encounter (Signed)
 Patient called and would like a refill of his pain medication. Pharmacy on chart. Thank you.

## 2024-03-09 DIAGNOSIS — Z471 Aftercare following joint replacement surgery: Secondary | ICD-10-CM | POA: Diagnosis not present

## 2024-03-15 ENCOUNTER — Ambulatory Visit (INDEPENDENT_AMBULATORY_CARE_PROVIDER_SITE_OTHER): Admitting: Orthopaedic Surgery

## 2024-03-15 ENCOUNTER — Encounter: Payer: Self-pay | Admitting: Orthopaedic Surgery

## 2024-03-15 DIAGNOSIS — Z96642 Presence of left artificial hip joint: Secondary | ICD-10-CM

## 2024-03-15 MED ORDER — OXYCODONE HCL 5 MG PO TABS
5.0000 mg | ORAL_TABLET | Freq: Four times a day (QID) | ORAL | 0 refills | Status: DC | PRN
Start: 2024-03-15 — End: 2024-03-22

## 2024-03-15 NOTE — Progress Notes (Signed)
 The patient is here for his first postoperative visit status post a left total hip replacement.  He is ambulating using a walker he is having decent amount of knee pain.  He is working with therapy as well.  He has been compliant with a baby aspirin  twice a day.  He does report that he needs a refill of oxycodone .  His left hip incision looks good.  There are some swelling to be expected but he can try heating pad for this or alternating ice and heat.  Steri-Strips have been applied.  His calf is soft.  He will slowly increase his activities as comfort allows.  I will refill his oxycodone .  He can stop his baby aspirin  twice daily.  Will see him back in a month to see how he is doing from a mobility standpoint but no x-rays are needed.

## 2024-03-16 ENCOUNTER — Other Ambulatory Visit: Payer: Self-pay | Admitting: Medical

## 2024-03-16 DIAGNOSIS — Z96642 Presence of left artificial hip joint: Secondary | ICD-10-CM | POA: Diagnosis not present

## 2024-03-16 DIAGNOSIS — I10 Essential (primary) hypertension: Secondary | ICD-10-CM | POA: Diagnosis not present

## 2024-03-16 DIAGNOSIS — M47812 Spondylosis without myelopathy or radiculopathy, cervical region: Secondary | ICD-10-CM | POA: Diagnosis not present

## 2024-03-16 DIAGNOSIS — Z471 Aftercare following joint replacement surgery: Secondary | ICD-10-CM | POA: Diagnosis not present

## 2024-03-16 DIAGNOSIS — Z604 Social exclusion and rejection: Secondary | ICD-10-CM | POA: Diagnosis not present

## 2024-03-16 DIAGNOSIS — Z7982 Long term (current) use of aspirin: Secondary | ICD-10-CM | POA: Diagnosis not present

## 2024-03-16 DIAGNOSIS — M150 Primary generalized (osteo)arthritis: Secondary | ICD-10-CM | POA: Diagnosis not present

## 2024-03-16 DIAGNOSIS — Z79891 Long term (current) use of opiate analgesic: Secondary | ICD-10-CM | POA: Diagnosis not present

## 2024-03-16 DIAGNOSIS — Z791 Long term (current) use of non-steroidal anti-inflammatories (NSAID): Secondary | ICD-10-CM | POA: Diagnosis not present

## 2024-03-16 DIAGNOSIS — M48061 Spinal stenosis, lumbar region without neurogenic claudication: Secondary | ICD-10-CM | POA: Diagnosis not present

## 2024-03-16 DIAGNOSIS — M47816 Spondylosis without myelopathy or radiculopathy, lumbar region: Secondary | ICD-10-CM | POA: Diagnosis not present

## 2024-03-17 ENCOUNTER — Ambulatory Visit: Admitting: Urology

## 2024-03-22 ENCOUNTER — Other Ambulatory Visit: Payer: Self-pay | Admitting: Orthopaedic Surgery

## 2024-03-22 ENCOUNTER — Ambulatory Visit: Admitting: Medical

## 2024-03-22 ENCOUNTER — Telehealth: Payer: Self-pay | Admitting: Orthopaedic Surgery

## 2024-03-22 MED ORDER — OXYCODONE HCL 5 MG PO TABS
5.0000 mg | ORAL_TABLET | Freq: Four times a day (QID) | ORAL | 0 refills | Status: DC | PRN
Start: 2024-03-22 — End: 2024-03-29

## 2024-03-22 NOTE — Telephone Encounter (Signed)
 Patient called. He would like a refill on oxycodone . His cb# [phone]

## 2024-03-23 DIAGNOSIS — Z471 Aftercare following joint replacement surgery: Secondary | ICD-10-CM | POA: Diagnosis not present

## 2024-03-23 DIAGNOSIS — I10 Essential (primary) hypertension: Secondary | ICD-10-CM | POA: Diagnosis not present

## 2024-03-23 DIAGNOSIS — Z79891 Long term (current) use of opiate analgesic: Secondary | ICD-10-CM | POA: Diagnosis not present

## 2024-03-23 DIAGNOSIS — M47816 Spondylosis without myelopathy or radiculopathy, lumbar region: Secondary | ICD-10-CM | POA: Diagnosis not present

## 2024-03-23 DIAGNOSIS — Z604 Social exclusion and rejection: Secondary | ICD-10-CM | POA: Diagnosis not present

## 2024-03-23 DIAGNOSIS — Z96642 Presence of left artificial hip joint: Secondary | ICD-10-CM | POA: Diagnosis not present

## 2024-03-23 DIAGNOSIS — Z7982 Long term (current) use of aspirin: Secondary | ICD-10-CM | POA: Diagnosis not present

## 2024-03-23 DIAGNOSIS — M48061 Spinal stenosis, lumbar region without neurogenic claudication: Secondary | ICD-10-CM | POA: Diagnosis not present

## 2024-03-23 DIAGNOSIS — M47812 Spondylosis without myelopathy or radiculopathy, cervical region: Secondary | ICD-10-CM | POA: Diagnosis not present

## 2024-03-23 DIAGNOSIS — Z791 Long term (current) use of non-steroidal anti-inflammatories (NSAID): Secondary | ICD-10-CM | POA: Diagnosis not present

## 2024-03-23 DIAGNOSIS — M150 Primary generalized (osteo)arthritis: Secondary | ICD-10-CM | POA: Diagnosis not present
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
## 2024-03-24 ENCOUNTER — Ambulatory Visit (INDEPENDENT_AMBULATORY_CARE_PROVIDER_SITE_OTHER): Admitting: Urology

## 2024-03-24 ENCOUNTER — Encounter: Payer: Self-pay | Admitting: Medical

## 2024-03-24 ENCOUNTER — Ambulatory Visit (INDEPENDENT_AMBULATORY_CARE_PROVIDER_SITE_OTHER): Admitting: Medical

## 2024-03-24 VITALS — BP 131/82 | HR 91 | Ht 72.0 in | Wt 185.0 lb

## 2024-03-24 VITALS — BP 110/70 | HR 92 | Temp 98.0°F | Resp 18 | Ht 72.0 in | Wt 182.0 lb

## 2024-03-24 DIAGNOSIS — E871 Hypo-osmolality and hyponatremia: Secondary | ICD-10-CM | POA: Diagnosis not present

## 2024-03-24 DIAGNOSIS — R3129 Other microscopic hematuria: Secondary | ICD-10-CM | POA: Diagnosis not present

## 2024-03-24 DIAGNOSIS — M25552 Pain in left hip: Secondary | ICD-10-CM | POA: Diagnosis not present

## 2024-03-24 DIAGNOSIS — M25562 Pain in left knee: Secondary | ICD-10-CM | POA: Diagnosis not present

## 2024-03-24 DIAGNOSIS — D649 Anemia, unspecified: Secondary | ICD-10-CM | POA: Diagnosis not present

## 2024-03-24 DIAGNOSIS — R319 Hematuria, unspecified: Secondary | ICD-10-CM | POA: Diagnosis not present

## 2024-03-24 LAB — MICROSCOPIC EXAMINATION

## 2024-03-24 LAB — URINALYSIS, ROUTINE W REFLEX MICROSCOPIC
Bilirubin, UA: NEGATIVE
Glucose, UA: NEGATIVE
Ketones, UA: NEGATIVE
Leukocytes,UA: NEGATIVE
Nitrite, UA: NEGATIVE
Protein,UA: NEGATIVE
Specific Gravity, UA: 1.025 (ref 1.005–1.030)
Urobilinogen, Ur: 0.2 mg/dL (ref 0.2–1.0)
pH, UA: 5 (ref 5.0–7.5)

## 2024-03-24 LAB — CBC WITH DIFFERENTIAL/PLATELET
Basophils Absolute: 0 10*3/uL (ref 0.0–0.1)
Basophils Relative: 1 % (ref 0.0–3.0)
Eosinophils Absolute: 0.2 10*3/uL (ref 0.0–0.7)
Eosinophils Relative: 4.2 % (ref 0.0–5.0)
HCT: 36.8 % — ABNORMAL LOW (ref 39.0–52.0)
Hemoglobin: 12.3 g/dL — ABNORMAL LOW (ref 13.0–17.0)
Lymphocytes Relative: 35.7 % (ref 12.0–46.0)
Lymphs Abs: 1.8 10*3/uL (ref 0.7–4.0)
MCHC: 33.4 g/dL (ref 30.0–36.0)
MCV: 93.7 fl (ref 78.0–100.0)
Monocytes Absolute: 0.5 10*3/uL (ref 0.1–1.0)
Monocytes Relative: 9.3 % (ref 3.0–12.0)
Neutro Abs: 2.5 10*3/uL (ref 1.4–7.7)
Neutrophils Relative %: 49.8 % (ref 43.0–77.0)
Platelets: 318 10*3/uL (ref 150.0–400.0)
RBC: 3.92 Mil/uL — ABNORMAL LOW (ref 4.22–5.81)
RDW: 14.7 % (ref 11.5–15.5)
WBC: 5 10*3/uL (ref 4.0–10.5)

## 2024-03-24 NOTE — Patient Instructions (Addendum)
 Left hip pain post-surgery Post-surgical left hip pain with swelling, expected to decrease. Physical therapy ongoing. - Continue physical therapy for left hip rehabilitation.  Left knee pain and swelling Persistent left knee pain and swelling post-hip surgery. History of two knee surgeries and mild degenerative changes. Pain worsens without brace. - Refer to sports medicine for evaluation of left knee. - Consider ultrasound or MRI to assess for fluid or soft tissue issues. -will continue tylenol  for pain. Has celebrex  as back up if needed.  Hematuria Moderate hematuria with infection cells. Smoking history raises bladder concerns. Referral to urologist for further evaluation. - Attend urology appointment for further evaluation of hematuria.  Hypertension Blood pressure well controlled with current medication. - Continue losartan  for blood pressure management.  Anemia Slight anemia possibly due to post-surgical drop in hemoglobin and hematocrit. - Repeat CBC to assess hemoglobin and hematocrit levels.  Low sodium levels Slightly low sodium levels in recent labs. - Repeat metabolic panel to assess sodium levels.  Follow up week of May 26th or sooner if needed
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
## 2024-03-25 LAB — COMPREHENSIVE METABOLIC PANEL WITH GFR
ALT: 13 U/L (ref 0–53)
AST: 14 U/L (ref 0–37)
Albumin: 3.9 g/dL (ref 3.5–5.2)
Alkaline Phosphatase: 84 U/L (ref 39–117)
BUN: 14 mg/dL (ref 6–23)
CO2: 26 meq/L (ref 19–32)
Calcium: 8.9 mg/dL (ref 8.4–10.5)
Chloride: 101 meq/L (ref 96–112)
Creatinine, Ser: 1.05 mg/dL (ref 0.40–1.50)
GFR: 78.2 mL/min (ref >60.00)
Glucose, Bld: 91 mg/dL (ref 70–99)
Potassium: 4.2 meq/L (ref 3.5–5.1)
Sodium: 135 meq/L (ref 135–145)
Total Bilirubin: 0.3 mg/dL (ref 0.2–1.2)
Total Protein: 6.9 g/dL (ref 6.0–8.3)

## 2024-03-29 ENCOUNTER — Other Ambulatory Visit: Payer: Self-pay | Admitting: Orthopaedic Surgery

## 2024-03-29 ENCOUNTER — Telehealth: Payer: Self-pay | Admitting: Orthopaedic Surgery

## 2024-03-29 MED ORDER — OXYCODONE HCL 5 MG PO TABS: 5.0000 mg | ORAL_TABLET | Freq: Four times a day (QID) | ORAL | 0 refills | Status: DC | PRN

## 2024-03-29 NOTE — Telephone Encounter (Signed)
Patient called. Would like oxycodone called in for his pain.

## 2024-03-30 NOTE — Progress Notes (Deleted)
    Cory Benson D.[facility] [address] [place] [address] Phone: [phone]   Assessment and Plan:     There are no diagnoses linked to this encounter.  ***   Pertinent previous records reviewed include ***    Follow Up: ***     Subjective:   I, Cory Benson, am serving as a Neurosurgeon for Doctor Cory Benson  Chief Complaint: left knee pain   HPI:   03/31/2024 Patient is a 58 year old male with left knee pain. Patient states   Relevant Historical Information: ***  Additional pertinent review of systems negative.   Current Outpatient Medications:    albuterol  (VENTOLIN  HFA) 108 (90 Base) MCG/ACT inhaler, Inhale 1-2 puffs into the lungs every 6 (six) hours as needed. (Patient not taking: Reported on 02/16/2024), Disp: 8 g, Rfl: 2   aspirin  81 MG chewable tablet, Chew 1 tablet (81 mg total) by mouth 2 (two) times daily., Disp: 30 tablet, Rfl: 0   atorvastatin  (LIPITOR) 10 MG tablet, Take 1 tablet (10 mg total) by mouth daily., Disp: 90 tablet, Rfl: 3   celecoxib  (CELEBREX ) 100 MG capsule, Take 1 capsule (100 mg total) by mouth 2 (two) times daily between meals as needed., Disp: 30 capsule, Rfl: 0   cyclobenzaprine  (FLEXERIL ) 10 MG tablet, Take 1 tablet (10 mg total) by mouth 3 (three) times daily as needed for muscle spasms. 1 tab po q hs prn muscle spasms, Disp: 40 tablet, Rfl: 1   famotidine  (PEPCID ) 20 MG tablet, TAKE 1 TABLET BY MOUTH DAILY (Patient not taking: Reported on 02/16/2024), Disp: 90 tablet, Rfl: 3   gabapentin  (NEURONTIN ) 300 MG capsule, Take 1 capsule (300 mg total) by mouth at bedtime., Disp: 90 capsule, Rfl: 0   losartan  (COZAAR ) 25 MG tablet, TAKE 1 TABLET(25 MG) BY MOUTH DAILY, Disp: 90 tablet, Rfl: 3   oxyCODONE  (OXY IR/ROXICODONE ) 5 MG immediate release tablet, Take 1-2 tablets (5-10 mg total) by mouth every 6 (six) hours as needed for moderate pain (pain score 4-6) (pain score 4-6)., Disp: 30 tablet, Rfl: 0    tadalafil  (CIALIS ) 20 MG tablet, TAKE 1/2-1 TABLET BY MOUTH PROR TO SEX, Disp: 10 tablet, Rfl: 3   Objective:     There were no vitals filed for this visit.    There is no height or weight on file to calculate BMI.    Physical Exam:    ***   Electronically signed by:  Marshall Skeeter D.[facility] 7:37 AM 03/30/24

## 2024-03-31 ENCOUNTER — Ambulatory Visit: Admitting: Sports Medicine

## 2024-04-01 DIAGNOSIS — M47816 Spondylosis without myelopathy or radiculopathy, lumbar region: Secondary | ICD-10-CM | POA: Diagnosis not present

## 2024-04-01 DIAGNOSIS — Z79891 Long term (current) use of opiate analgesic: Secondary | ICD-10-CM | POA: Diagnosis not present

## 2024-04-01 DIAGNOSIS — M48061 Spinal stenosis, lumbar region without neurogenic claudication: Secondary | ICD-10-CM | POA: Diagnosis not present

## 2024-04-01 DIAGNOSIS — Z96642 Presence of left artificial hip joint: Secondary | ICD-10-CM | POA: Diagnosis not present

## 2024-04-01 DIAGNOSIS — Z471 Aftercare following joint replacement surgery: Secondary | ICD-10-CM | POA: Diagnosis not present

## 2024-04-01 DIAGNOSIS — Z7982 Long term (current) use of aspirin: Secondary | ICD-10-CM | POA: Diagnosis not present

## 2024-04-01 DIAGNOSIS — Z604 Social exclusion and rejection: Secondary | ICD-10-CM | POA: Diagnosis not present

## 2024-04-01 DIAGNOSIS — M150 Primary generalized (osteo)arthritis: Secondary | ICD-10-CM | POA: Diagnosis not present

## 2024-04-01 DIAGNOSIS — Z791 Long term (current) use of non-steroidal anti-inflammatories (NSAID): Secondary | ICD-10-CM | POA: Diagnosis not present

## 2024-04-01 DIAGNOSIS — M47812 Spondylosis without myelopathy or radiculopathy, cervical region: Secondary | ICD-10-CM | POA: Diagnosis not present

## 2024-04-01 DIAGNOSIS — I10 Essential (primary) hypertension: Secondary | ICD-10-CM | POA: Diagnosis not present

## 2024-04-05 ENCOUNTER — Telehealth: Payer: Self-pay | Admitting: Orthopaedic Surgery

## 2024-04-05 ENCOUNTER — Other Ambulatory Visit: Payer: Self-pay | Admitting: Orthopaedic Surgery

## 2024-04-05 MED ORDER — OXYCODONE HCL 5 MG PO TABS: 5.0000 mg | ORAL_TABLET | Freq: Three times a day (TID) | ORAL | 0 refills | Status: DC | PRN

## 2024-04-05 NOTE — Telephone Encounter (Signed)
 Patient called and needs to refill on Oxycodone . [phone]

## 2024-04-06 NOTE — Progress Notes (Signed)
 Cory Benson D.[facility] [address] [place] [address] Phone: [phone]   Assessment and Plan:     1. Primary osteoarthritis of left knee 2. Chronic pain of left knee  -Current with exacerbation, initial visit - Consistent with flare of osteoarthritis of left knee.  Likely exacerbated with patient recovering from left hip replacement - Patient elected for intra-articular knee CSI.  Tolerated well per note below - Reviewed patient's left knee x-ray from 02/03/2024 and discussed mild degenerative changes primarily in patellofemoral compartment - May use Tylenol  for day-to-day pain relief - Start HEP for knee  Procedure: Knee Joint Injection Side: Left Indication: Flare of osteoarthritis  Risks explained and consent was given verbally. The site was cleaned with alcohol prep. A needle was introduced with an anterio-lateral approach. Injection given using 2mL of 1% lidocaine  without epinephrine  and 1mL of kenalog  40mg /ml. This was well tolerated and resulted in symptomatic relief.  Needle was removed, hemostasis achieved, and post injection instructions were explained.   Pt was advised to call or return to clinic if these symptoms worsen or fail to improve as anticipated.   Pertinent previous records reviewed include left knee x-ray 02/03/2024  Follow Up: 4 weeks for reevaluation.  If no improvement or worsening of symptoms, could consider NSAID versus prednisone  course versus physical therapy   Subjective:   I, Cory Benson, am serving as a Neurosurgeon for Cory Benson  Chief Complaint: left knee pain   HPI:   04/07/2024 Patient is a 58 year old male with left knee pain. Patient states had surgery and in that process he has left knee pain. 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   Current Outpatient  Medications:    albuterol  (VENTOLIN  HFA) 108 (90 Base) MCG/ACT inhaler, Inhale 1-2 puffs into the lungs every 6 (six) hours as needed. (Patient not taking: Reported on 02/16/2024), Disp: 8 g, Rfl: 2   aspirin  81 MG chewable tablet, Chew 1 tablet (81 mg total) by mouth 2 (two) times daily., Disp: 30 tablet, Rfl: 0   atorvastatin  (LIPITOR) 10 MG tablet, Take 1 tablet (10 mg total) by mouth daily., Disp: 90 tablet, Rfl: 3   celecoxib  (CELEBREX ) 100 MG capsule, Take 1 capsule (100 mg total) by mouth 2 (two) times daily between meals as needed., Disp: 30 capsule, Rfl: 0   cyclobenzaprine  (FLEXERIL ) 10 MG tablet, Take 1 tablet (10 mg total) by mouth 3 (three) times daily as needed for muscle spasms. 1 tab po q hs prn muscle spasms, Disp: 40 tablet, Rfl: 1   famotidine  (PEPCID ) 20 MG tablet, TAKE 1 TABLET BY MOUTH DAILY (Patient not taking: Reported on 02/16/2024), Disp: 90 tablet, Rfl: 3   gabapentin  (NEURONTIN ) 300 MG capsule, Take 1 capsule (300 mg total) by mouth at bedtime., Disp: 90 capsule, Rfl: 0   losartan  (COZAAR ) 25 MG tablet, TAKE 1 TABLET(25 MG) BY MOUTH DAILY, Disp: 90 tablet, Rfl: 3   oxyCODONE  (OXY IR/ROXICODONE ) 5 MG immediate release tablet, Take 1-2 tablets (5-10 mg total) by mouth 3 (three) times daily as needed for moderate pain (pain score 4-6) (pain score 4-6)., Disp: 30 tablet, Rfl: 0   tadalafil  (CIALIS ) 20 MG tablet, TAKE 1/2-1 TABLET BY MOUTH PROR TO SEX, Disp: 10 tablet, Rfl: 3   Objective:     Vitals:   04/07/24 1014  Pulse: 85  SpO2:  97%  Weight: 186 lb (84.4 kg)  Height: 6' (1.829 m)      Body mass index is 25.23 kg/m.    Physical Exam:    General:  awake, alert oriented, no acute distress nontoxic Skin: no suspicious lesions or rashes Neuro:sensation intact and strength 5/5 with no deficits, no atrophy, normal muscle tone Psych: No signs of anxiety, depression or other mood disorder  Left knee: No swelling No deformity Neg fluid wave, joint milking ROM Flex  110, Ext 0 TTP lateral and medial femoral condyles NTTP over the quad tendon,   patella, plica, patella tendon, tibial tuberostiy, fibular head, posterior fossa, pes anserine bursa, gerdy's tubercle, medial jt line, lateral jt line Neg anterior and posterior drawer Neg lachman Neg sag sign Negative varus stress Negative valgus stress Negative Mc[place]ray for palpable pop, though reproduced pain Positive Thessaly  Gait antalgic, favoring right leg with history of recent total hip replacement, and using 1 pronged cane for support   Electronically signed by:  Cory Benson D.[facility] 11:23 AM 04/07/24

## 2024-04-07 ENCOUNTER — Ambulatory Visit (INDEPENDENT_AMBULATORY_CARE_PROVIDER_SITE_OTHER): Admitting: Sports Medicine

## 2024-04-07 VITALS — HR 85 | Ht 72.0 in | Wt 186.0 lb

## 2024-04-07 DIAGNOSIS — Z471 Aftercare following joint replacement surgery: Secondary | ICD-10-CM | POA: Diagnosis not present

## 2024-04-07 DIAGNOSIS — M47812 Spondylosis without myelopathy or radiculopathy, cervical region: Secondary | ICD-10-CM | POA: Diagnosis not present

## 2024-04-07 DIAGNOSIS — G8929 Other chronic pain: Secondary | ICD-10-CM | POA: Diagnosis not present

## 2024-04-07 DIAGNOSIS — M47816 Spondylosis without myelopathy or radiculopathy, lumbar region: Secondary | ICD-10-CM | POA: Diagnosis not present

## 2024-04-07 DIAGNOSIS — Z79891 Long term (current) use of opiate analgesic: Secondary | ICD-10-CM | POA: Diagnosis not present

## 2024-04-07 DIAGNOSIS — M1712 Unilateral primary osteoarthritis, left knee: Secondary | ICD-10-CM | POA: Diagnosis not present

## 2024-04-07 DIAGNOSIS — I10 Essential (primary) hypertension: Secondary | ICD-10-CM | POA: Diagnosis not present

## 2024-04-07 DIAGNOSIS — Z96642 Presence of left artificial hip joint: Secondary | ICD-10-CM | POA: Diagnosis not present

## 2024-04-07 DIAGNOSIS — Z7982 Long term (current) use of aspirin: Secondary | ICD-10-CM | POA: Diagnosis not present

## 2024-04-07 DIAGNOSIS — Z791 Long term (current) use of non-steroidal anti-inflammatories (NSAID): Secondary | ICD-10-CM | POA: Diagnosis not present

## 2024-04-07 DIAGNOSIS — M150 Primary generalized (osteo)arthritis: Secondary | ICD-10-CM | POA: Diagnosis not present

## 2024-04-07 DIAGNOSIS — M25562 Pain in left knee: Secondary | ICD-10-CM

## 2024-04-07 DIAGNOSIS — Z604 Social exclusion and rejection: Secondary | ICD-10-CM | POA: Diagnosis not present

## 2024-04-07 DIAGNOSIS — M48061 Spinal stenosis, lumbar region without neurogenic claudication: Secondary | ICD-10-CM | POA: Diagnosis not present

## 2024-04-07 NOTE — Patient Instructions (Signed)
 Knee HEP  4 week follow up

## 2024-04-09 ENCOUNTER — Other Ambulatory Visit: Payer: Self-pay | Admitting: Orthopaedic Surgery

## 2024-04-09 ENCOUNTER — Telehealth: Payer: Self-pay | Admitting: Orthopaedic Surgery

## 2024-04-09 MED ORDER — OXYCODONE HCL 5 MG PO TABS
5.0000 mg | ORAL_TABLET | Freq: Three times a day (TID) | ORAL | 0 refills | Status: DC | PRN
Start: 2024-04-09 — End: 2024-04-14

## 2024-04-09 NOTE — Telephone Encounter (Signed)
 Patient called and needs a refill on pain medication. [phone]

## 2024-04-13 ENCOUNTER — Encounter: Payer: Self-pay | Admitting: Medical

## 2024-04-13 ENCOUNTER — Ambulatory Visit (INDEPENDENT_AMBULATORY_CARE_PROVIDER_SITE_OTHER): Admitting: Medical

## 2024-04-13 VITALS — BP 130/80 | HR 98 | Temp 98.2°F | Resp 18 | Ht 72.0 in | Wt 185.0 lb

## 2024-04-13 DIAGNOSIS — M47816 Spondylosis without myelopathy or radiculopathy, lumbar region: Secondary | ICD-10-CM | POA: Diagnosis not present

## 2024-04-13 DIAGNOSIS — Z79891 Long term (current) use of opiate analgesic: Secondary | ICD-10-CM | POA: Diagnosis not present

## 2024-04-13 DIAGNOSIS — M25562 Pain in left knee: Secondary | ICD-10-CM | POA: Diagnosis not present

## 2024-04-13 DIAGNOSIS — M25512 Pain in left shoulder: Secondary | ICD-10-CM

## 2024-04-13 DIAGNOSIS — R319 Hematuria, unspecified: Secondary | ICD-10-CM | POA: Diagnosis not present

## 2024-04-13 DIAGNOSIS — M25552 Pain in left hip: Secondary | ICD-10-CM

## 2024-04-13 DIAGNOSIS — G8929 Other chronic pain: Secondary | ICD-10-CM | POA: Diagnosis not present

## 2024-04-13 DIAGNOSIS — Z791 Long term (current) use of non-steroidal anti-inflammatories (NSAID): Secondary | ICD-10-CM | POA: Diagnosis not present

## 2024-04-13 DIAGNOSIS — Z7982 Long term (current) use of aspirin: Secondary | ICD-10-CM | POA: Diagnosis not present

## 2024-04-13 DIAGNOSIS — I10 Essential (primary) hypertension: Secondary | ICD-10-CM | POA: Diagnosis not present

## 2024-04-13 DIAGNOSIS — M150 Primary generalized (osteo)arthritis: Secondary | ICD-10-CM | POA: Diagnosis not present

## 2024-04-13 DIAGNOSIS — M47812 Spondylosis without myelopathy or radiculopathy, cervical region: Secondary | ICD-10-CM | POA: Diagnosis not present

## 2024-04-13 DIAGNOSIS — Z471 Aftercare following joint replacement surgery: Secondary | ICD-10-CM | POA: Diagnosis not present

## 2024-04-13 DIAGNOSIS — Z96642 Presence of left artificial hip joint: Secondary | ICD-10-CM | POA: Diagnosis not present

## 2024-04-13 DIAGNOSIS — M48061 Spinal stenosis, lumbar region without neurogenic claudication: Secondary | ICD-10-CM | POA: Diagnosis not present

## 2024-04-13 DIAGNOSIS — Z604 Social exclusion and rejection: Secondary | ICD-10-CM | POA: Diagnosis not present
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
## 2024-04-13 NOTE — Patient Instructions (Signed)
 Left knee pain  -some improved with injection. - Follow up with sports med MD.  Post-surgical left hip pain Occasional pinching sensation post-surgery. Undergoing home physical therapy. Working part-time. - Continue physical therapy exercises. - Follow up with orthopedist for evaluation and work clearance.  Slight tear of left rotator cuff Mild, occasional pain exacerbated by sudden movements. No surgery required. - Refer to specialist if pain worsens.  Blood in urine (hematuria) Trace blood previously detected. Urologist found no significant issues. Repeat urinalysis recommended. - Repeat urinalysis in June after work hours per urologist recommendations.  Hypertension Blood pressure improved to 130/80 on losartan  25 mg. Adheres to medication regimen. - Continue losartan  25 mg daily.  Hyperlipidemia On atorvastatin  10 mg. Recent labs normal. Adheres to medication regimen. - Continue atorvastatin  10 mg daily.  Follow up in 2 months or sooner if needed

## 2024-04-14 ENCOUNTER — Encounter: Payer: Self-pay | Admitting: Orthopaedic Surgery

## 2024-04-14 ENCOUNTER — Ambulatory Visit (INDEPENDENT_AMBULATORY_CARE_PROVIDER_SITE_OTHER): Admitting: Orthopaedic Surgery

## 2024-04-14 DIAGNOSIS — Z96642 Presence of left artificial hip joint: Secondary | ICD-10-CM

## 2024-04-14 MED ORDER — OXYCODONE HCL 5 MG PO TABS: 5.0000 mg | ORAL_TABLET | Freq: Three times a day (TID) | ORAL | 0 refills | Status: DC | PRN

## 2024-04-14 NOTE — Progress Notes (Signed)
 The patient is now 6 weeks status post a left total hip arthroplasty to treat significant left hip pain.  He is ambulating with a cane.  He wants to go back to work tomorrow and feels like he can do that.  However he is requesting 1 more prescription for oxycodone .  On exam his left operative hip is moving well.  His incision is healed nicely.  He did have a steroid injection last week in his left knee secondary to pain from the surgery itself from manipulating his left hip.  He says that has done well.  From my standpoint he can return to work tomorrow.  I gave him a note for that.  I will send in 1 more prescription of oxycodone  but will not refill the medication after that.  From an orthopedic standpoint, we will see him back in 3 months with a standing AP pelvis and lateral of his left operative hip.

## 2024-04-19 ENCOUNTER — Other Ambulatory Visit: Payer: Self-pay | Admitting: Physician Assistant

## 2024-04-19 ENCOUNTER — Telehealth: Payer: Self-pay | Admitting: Physician Assistant

## 2024-04-19 MED ORDER — OXYCODONE HCL 5 MG PO TABS: 5.0000 mg | ORAL_TABLET | Freq: Three times a day (TID) | ORAL | 0 refills | Status: DC | PRN

## 2024-04-19 NOTE — Telephone Encounter (Signed)
 Pt called requesting refill of oxycodone . Please send to [facility]. Pt phone number is [phone]

## 2024-04-26 ENCOUNTER — Telehealth: Payer: Self-pay | Admitting: Orthopaedic Surgery

## 2024-04-26 ENCOUNTER — Other Ambulatory Visit: Payer: Self-pay | Admitting: Orthopaedic Surgery

## 2024-04-26 MED ORDER — OXYCODONE HCL 5 MG PO TABS: 5.0000 mg | ORAL_TABLET | Freq: Three times a day (TID) | ORAL | 0 refills | Status: AC | PRN

## 2024-04-26 NOTE — Telephone Encounter (Signed)
 Patient called and said he needs a refill on his pain medication. Also his hip keep popping every time he stretch. [phone]

## 2024-04-26 NOTE — Telephone Encounter (Signed)
 Patient aware of the below message

## 2024-04-27 ENCOUNTER — Encounter (HOSPITAL_BASED_OUTPATIENT_CLINIC_OR_DEPARTMENT_OTHER): Admitting: Primary Care

## 2024-04-30 DIAGNOSIS — Z604 Social exclusion and rejection: Secondary | ICD-10-CM | POA: Diagnosis not present

## 2024-04-30 DIAGNOSIS — Z96642 Presence of left artificial hip joint: Secondary | ICD-10-CM | POA: Diagnosis not present

## 2024-04-30 DIAGNOSIS — M47816 Spondylosis without myelopathy or radiculopathy, lumbar region: Secondary | ICD-10-CM | POA: Diagnosis not present

## 2024-04-30 DIAGNOSIS — M150 Primary generalized (osteo)arthritis: Secondary | ICD-10-CM | POA: Diagnosis not present

## 2024-04-30 DIAGNOSIS — Z7982 Long term (current) use of aspirin: Secondary | ICD-10-CM | POA: Diagnosis not present

## 2024-04-30 DIAGNOSIS — Z79891 Long term (current) use of opiate analgesic: Secondary | ICD-10-CM | POA: Diagnosis not present

## 2024-04-30 DIAGNOSIS — Z471 Aftercare following joint replacement surgery: Secondary | ICD-10-CM | POA: Diagnosis not present

## 2024-04-30 DIAGNOSIS — M48061 Spinal stenosis, lumbar region without neurogenic claudication: Secondary | ICD-10-CM | POA: Diagnosis not present

## 2024-04-30 DIAGNOSIS — Z791 Long term (current) use of non-steroidal anti-inflammatories (NSAID): Secondary | ICD-10-CM | POA: Diagnosis not present

## 2024-04-30 DIAGNOSIS — I10 Essential (primary) hypertension: Secondary | ICD-10-CM | POA: Diagnosis not present

## 2024-04-30 DIAGNOSIS — M47812 Spondylosis without myelopathy or radiculopathy, cervical region: Secondary | ICD-10-CM | POA: Diagnosis not present

## 2024-05-02 ENCOUNTER — Other Ambulatory Visit: Payer: Self-pay | Admitting: Medical

## 2024-05-03 NOTE — Progress Notes (Signed)
 [facility] [address] [place] [address] Phone: [phone]   Assessment and Plan:     1. Primary osteoarthritis of left knee 2. Chronic pain of left knee -Chronic with exacerbation, subsequent visit - Significant improvement in knee pain after intra-articular CSI performed at previous office visit on 04/07/2024.  Consistent with resolving flare of knee osteoarthritis - Continue Tylenol  for day-to-day pain relief - Continue HEP for knee  3. Chronic left shoulder pain  -Chronic with exacerbation, initial visit - Recurrence of left shoulder pain most consistent with subacromial bursitis versus rotator cuff tendinopathy - Patient states that he had a left shoulder MRI showing partial tearing of rotator cuff years ago.  We do not have this MRI on file, however we have an unremarkable x-ray on file and patient's physical exam is most consistent with rotator cuff tendinopathy - Patient elected for subacromial CSI.  Tolerated well per note below - Continue Tylenol  for day-to-day pain relief - Start HEP for rotator cuff  Procedure: Subacromial Injection Side: Left  Risks explained and consent was given verbally. The site was cleaned with alcohol prep. A steroid injection was performed from posterior approach using 2mL of 1% lidocaine  without epinephrine  and 1mL of kenalog  40mg /ml. This was well tolerated and resulted in symptomatic relief.  Needle was removed, hemostasis achieved, and post injection instructions were explained.   Pt was advised to call or return to clinic if these symptoms worsen or fail to improve as anticipated.  15 additional minutes spent for educating Therapeutic Home Exercise Program.  This included exercises focusing on stretching, strengthening, with focus on eccentric aspects.   Long term goals include an improvement in range of motion, strength, endurance as well as avoiding reinjury. Patient's frequency would  include in 1-2 times a day, 3-5 times a week for a duration of 6-12 weeks. Proper technique shown and discussed handout in great detail with ATC.  All questions were discussed and answered.    Pertinent previous records reviewed include   left shoulder x-ray 09/02/2022  Follow Up: As needed if no improvement or worsening of symptoms.  Could consider physical therapy   Subjective:   I, Cory Benson, am serving as a Neurosurgeon for Doctor Cory Benson  Chief Complaint: left knee pain  HPI:   04/07/2024 Patient is a 58 year old male with left knee pain. Patient states had surgery and in that process he has left knee pain. Pain since April.  05/05/24 Patient states knee is feeling good. He would like to discuss CSI in left shoulder    Relevant Historical Information: History of lumbar spine fusion and left hip replacement  Additional pertinent review of systems negative.   Current Outpatient Medications:    albuterol  (VENTOLIN  HFA) 108 (90 Base) MCG/ACT inhaler, Inhale 1-2 puffs into the lungs every 6 (six) hours as needed. (Patient not taking: Reported on 02/16/2024), Disp: 8 g, Rfl: 2   aspirin  81 MG chewable tablet, Chew 1 tablet (81 mg total) by mouth 2 (two) times daily., Disp: 30 tablet, Rfl: 0   atorvastatin  (LIPITOR) 10 MG tablet, Take 1 tablet (10 mg total) by mouth daily., Disp: 90 tablet, Rfl: 3   celecoxib  (CELEBREX ) 100 MG capsule, Take 1 capsule (100 mg total) by mouth 2 (two) times daily between meals as needed., Disp: 30 capsule, Rfl: 0   cyclobenzaprine  (FLEXERIL ) 10 MG tablet, Take 1 tablet (10 mg total) by mouth 3 (three) times daily as needed  for muscle spasms. 1 tab po q hs prn muscle spasms, Disp: 40 tablet, Rfl: 1   famotidine  (PEPCID ) 20 MG tablet, TAKE 1 TABLET BY MOUTH DAILY (Patient not taking: Reported on 02/16/2024), Disp: 90 tablet, Rfl: 3   gabapentin  (NEURONTIN ) 300 MG capsule, TAKE 1 CAPSULE(300 MG) BY MOUTH AT BEDTIME, Disp: 90 capsule, Rfl: 0   losartan   (COZAAR ) 25 MG tablet, TAKE 1 TABLET(25 MG) BY MOUTH DAILY, Disp: 90 tablet, Rfl: 3   oxyCODONE  (OXY IR/ROXICODONE ) 5 MG immediate release tablet, Take 1-2 tablets (5-10 mg total) by mouth 3 (three) times daily as needed for moderate pain (pain score 4-6) (pain score 4-6)., Disp: 30 tablet, Rfl: 0   tadalafil  (CIALIS ) 20 MG tablet, TAKE 1/2-1 TABLET BY MOUTH PROR TO SEX, Disp: 10 tablet, Rfl: 3   Objective:     Vitals:   05/05/24 1430  BP: 118/86  Pulse: 85  SpO2: 96%  Weight: 182 lb (82.6 kg)  Height: 6' (1.829 m)      Body mass index is 24.68 kg/m.    Physical Exam:    Gen: Appears well, nad, nontoxic and pleasant Neuro:sensation intact, strength is 5/5 with df/pf/inv/ev, muscle tone wnl Skin: no suspicious lesion or defmority Psych: A&O, appropriate mood and affect  Left shoulder:  No deformity, swelling or muscle wasting No scapular winging FF 160, abd 160, int 10, ext 90 NTTP over the , clavicle, ac, coracoid, biceps groove, humerus, deltoid, trapezius, cervical spine Positive neer, hawkins, empty can, obriens, crossarm, Negative subscap liftoff, speeds Neg ant drawer, sulcus sign, apprehension Negative Spurling's test bilat FROM of neck    Electronically signed by:  [facility] 2:45 PM 05/05/24

## 2024-05-05 ENCOUNTER — Ambulatory Visit: Admitting: Sports Medicine

## 2024-05-05 VITALS — BP 118/86 | HR 85 | Ht 72.0 in | Wt 182.0 lb

## 2024-05-05 DIAGNOSIS — M25512 Pain in left shoulder: Secondary | ICD-10-CM

## 2024-05-05 DIAGNOSIS — M25562 Pain in left knee: Secondary | ICD-10-CM | POA: Diagnosis not present

## 2024-05-05 DIAGNOSIS — M1712 Unilateral primary osteoarthritis, left knee: Secondary | ICD-10-CM | POA: Diagnosis not present

## 2024-05-05 DIAGNOSIS — G8929 Other chronic pain: Secondary | ICD-10-CM

## 2024-05-05 NOTE — Patient Instructions (Signed)
 Shoulder HEP  As needed follow up if no improvement 4 week follow up

## 2024-05-17 ENCOUNTER — Telehealth: Payer: Self-pay | Admitting: Medical

## 2024-05-17 ENCOUNTER — Other Ambulatory Visit: Payer: Self-pay | Admitting: Medical

## 2024-05-17 NOTE — Telephone Encounter (Signed)
 Copied from CRM [id number]. Topic: Clinical - Medication Refill >> May 17, 2024  3:27 PM Thersia C wrote: Medication: tadalafil  (CIALIS ) 20 MG tablet  Has the patient contacted their pharmacy? Yes (Agent: If no, request that the patient contact the pharmacy for the refill. If patient does not wish to contact the pharmacy document the reason why and proceed with request.) (Agent: If yes, when and what did the pharmacy advise?)  This is the patient's preferred pharmacy:  [facility] [phone] - [place], [place] - [address] AT [facility] [address] [place] [address] Phone: [phone] Fax: [phone]    Is this the correct pharmacy for this prescription? Yes If no, delete pharmacy and type the correct one.   Has the prescription been filled recently? No  Is the patient out of the medication? Yes  Has the patient been seen for an appointment in the last year OR does the patient have an upcoming appointment? Yes  Can we respond through MyChart? Yes  Agent: Please be advised that Rx refills may take up to 3 business days. We ask that you follow-up with your pharmacy.
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
## 2024-05-24 ENCOUNTER — Other Ambulatory Visit

## 2024-05-24 ENCOUNTER — Ambulatory Visit: Payer: Self-pay

## 2024-05-24 ENCOUNTER — Other Ambulatory Visit: Admitting: Urology

## 2024-05-24 ENCOUNTER — Other Ambulatory Visit: Payer: Self-pay

## 2024-05-24 ENCOUNTER — Other Ambulatory Visit: Payer: Self-pay | Admitting: Urology

## 2024-05-24 DIAGNOSIS — R3129 Other microscopic hematuria: Secondary | ICD-10-CM | POA: Diagnosis not present

## 2024-05-24 LAB — URINALYSIS, ROUTINE W REFLEX MICROSCOPIC
Bilirubin, UA: NEGATIVE
Glucose, UA: NEGATIVE
Ketones, UA: NEGATIVE
Leukocytes,UA: NEGATIVE
Nitrite, UA: NEGATIVE
Protein,UA: NEGATIVE
Specific Gravity, UA: 1.015 (ref 1.005–1.030)
Urobilinogen, Ur: 0.2 mg/dL (ref 0.2–1.0)
pH, UA: 5.5 (ref 5.0–7.5)

## 2024-05-24 LAB — MICROSCOPIC EXAMINATION

## 2024-06-07 ENCOUNTER — Encounter (HOSPITAL_BASED_OUTPATIENT_CLINIC_OR_DEPARTMENT_OTHER): Payer: Self-pay

## 2024-06-07 ENCOUNTER — Ambulatory Visit (HOSPITAL_BASED_OUTPATIENT_CLINIC_OR_DEPARTMENT_OTHER)
Admission: RE | Admit: 2024-06-07 | Discharge: 2024-06-07 | Disposition: A | Discharge status: Home / Self Care | Source: Ambulatory Visit | Attending: Urology | Admitting: Urology

## 2024-06-07 DIAGNOSIS — R3129 Other microscopic hematuria: Secondary | ICD-10-CM | POA: Diagnosis not present

## 2024-06-07 MED ORDER — IOHEXOL 300 MG/ML  SOLN
100.0000 mL | Freq: Once | INTRAMUSCULAR | Status: AC | PRN
  Administered 2024-06-07: 125 mL via INTRAVENOUS

## 2024-06-08 ENCOUNTER — Ambulatory Visit: Payer: Self-pay | Admitting: Urology

## 2024-06-14 ENCOUNTER — Encounter: Payer: Self-pay | Admitting: Medical

## 2024-06-14 ENCOUNTER — Ambulatory Visit (INDEPENDENT_AMBULATORY_CARE_PROVIDER_SITE_OTHER): Admitting: Medical

## 2024-06-14 VITALS — BP 118/84 | HR 78 | Resp 17 | Ht 72.0 in | Wt 181.6 lb

## 2024-06-14 DIAGNOSIS — M25512 Pain in left shoulder: Secondary | ICD-10-CM

## 2024-06-14 DIAGNOSIS — I10 Essential (primary) hypertension: Secondary | ICD-10-CM | POA: Diagnosis not present

## 2024-06-14 DIAGNOSIS — E785 Hyperlipidemia, unspecified: Secondary | ICD-10-CM | POA: Diagnosis not present

## 2024-06-14 DIAGNOSIS — F17200 Nicotine dependence, unspecified, uncomplicated: Secondary | ICD-10-CM

## 2024-06-14 DIAGNOSIS — R319 Hematuria, unspecified: Secondary | ICD-10-CM | POA: Diagnosis not present

## 2024-06-14 DIAGNOSIS — G8929 Other chronic pain: Secondary | ICD-10-CM | POA: Diagnosis not present

## 2024-06-14 MED ORDER — LOSARTAN POTASSIUM 25 MG PO TABS: 25.0000 mg | ORAL_TABLET | Freq: Every day | ORAL | 3 refills | Status: DC

## 2024-06-14 NOTE — Progress Notes (Signed)
   Subjective:    Patient ID: Cory Benson, male    DOB: 1966-04-01, 58 y.o.   MRN: [id number]  HPI Cory Benson is a 58 year old male who presents with persistent hematuria.  He has a history of hematuria initially identified on urinalysis, leading to a referral to a urologist. Despite initial assessments, follow-up urinalysis continued to show blood in the urine. A CT urogram was performed on June 07, 2024.  He has a significant smoking history, currently smoking three to four cigarettes a day, with a cumulative 37 pack-year history. He is attempting to reduce his smoking.  He is concerned about coronary artery atherosclerosis noted in a previous CT scan for lung cancer screening conducted on April 28, 2023. He followed up with a cardiologist on June 03, 2023. He continues to take atorvastatin  10 mg daily for cholesterol management and losartan  25 mg daily for hypertension.  He has a partially torn rotator cuff diagnosed via MRI, with ongoing left shoulder pain and reduced range of motion. He previously consulted with a sports medicine specialist for this issue.  He is not diabetic but has had slightly elevated blood sugar levels in the past. He is not on any medication for diabetes.   Review of Systems See hpi    Objective:   Physical Exam  General Mental Status- Alert. General Appearance- Not in acute distress.   Skin General: Color- Normal Color. Moisture- Normal Moisture.  Neck Carotid Arteries- Normal color. Moisture- Normal Moisture. No carotid bruits. No JVD.  Chest and Lung Exam Auscultation: Breath Sounds:-Normal.  Cardiovascular Auscultation:Rythm- Regular. Murmurs & Other Heart Sounds:Auscultation of the heart reveals- No Murmurs.  Abdomen Inspection:-Inspeection Normal. Palpation/Percussion:Note:No mass. Palpation and Percussion of the abdomen reveal- Non Tender, Non Distended + BS, no rebound or guarding.   Neurologic Cranial Nerve exam:- CN III-XII  intact(No nystagmus), symmetric smile. Strength:- 5/5 equal and symmetric strength both upper and lower extremities.   Left shoulder- appears to have some decreased range of motion on abduction. No warmth  or crepiutus.     Assessment & Plan:   Patient Instructions  Hematuria Persistent hematuria with no stones, hydronephrosis, or bladder abnormalities on CT urogram. - Urologist  appointment for further evaluation on 06-21-2024  Coronary artery atherosclerosis Coronary artery atherosclerosis with LDL target below 70 mg/dL. Overdue for cardiology follow-up. - Continue atorvastatin  10 mg daily. - Refer to cardiologist for overdue annual evaluation.  Hypertension Hypertension well-controlled with current regimen. - Refill losartan  25 mg daily.  Chronic left shoulder pain with rotator cuff injury Chronic left shoulder pain with partially torn rotator cuff and worsening range of motion. - Refer to Dr. Leonce, sports medicine, for further evaluation and management.  General Health Maintenance 37 pack-year smoking history, currently smoking 3-4 cigarettes per day. Due for annual CT lung cancer screening. - Order CT lung cancer screening. - Encourage smoking cessation with a goal to quit.  Follow up  in 2 months or sooner if needed

## 2024-06-14 NOTE — Patient Instructions (Signed)
 Hematuria Persistent hematuria with no stones, hydronephrosis, or bladder abnormalities on CT urogram. - Urologist  appointment for further evaluation on 06-21-2024  Coronary artery atherosclerosis Coronary artery atherosclerosis with LDL target below 70 mg/dL. Overdue for cardiology follow-up. - Continue atorvastatin  10 mg daily. - Refer to cardiologist for overdue annual evaluation.  Hypertension Hypertension well-controlled with current regimen. - Refill losartan  25 mg daily.  Chronic left shoulder pain with rotator cuff injury Chronic left shoulder pain with partially torn rotator cuff and worsening range of motion. - Refer to Dr. Leonce, sports medicine, for further evaluation and management.  General Health Maintenance 37 pack-year smoking history, currently smoking 3-4 cigarettes per day. Due for annual CT lung cancer screening. - Order CT lung cancer screening. - Encourage smoking cessation with a goal to quit.  Follow up  in 2 months or sooner if needed

## 2024-06-21 ENCOUNTER — Ambulatory Visit: Admitting: Urology

## 2024-06-21 ENCOUNTER — Ambulatory Visit (HOSPITAL_BASED_OUTPATIENT_CLINIC_OR_DEPARTMENT_OTHER)
Admission: RE | Admit: 2024-06-21 | Discharge: 2024-06-21 | Disposition: A | Discharge status: Home / Self Care | Source: Ambulatory Visit | Attending: Medical | Admitting: Medical

## 2024-06-21 VITALS — BP 130/85 | HR 70 | Ht 72.0 in | Wt 185.0 lb

## 2024-06-21 DIAGNOSIS — I700 Atherosclerosis of aorta: Secondary | ICD-10-CM | POA: Insufficient documentation

## 2024-06-21 DIAGNOSIS — Z122 Encounter for screening for malignant neoplasm of respiratory organs: Secondary | ICD-10-CM | POA: Diagnosis not present

## 2024-06-21 DIAGNOSIS — I2510 Atherosclerotic heart disease of native coronary artery without angina pectoris: Secondary | ICD-10-CM | POA: Insufficient documentation

## 2024-06-21 DIAGNOSIS — F17200 Nicotine dependence, unspecified, uncomplicated: Secondary | ICD-10-CM | POA: Diagnosis present

## 2024-06-21 DIAGNOSIS — J439 Emphysema, unspecified: Secondary | ICD-10-CM | POA: Diagnosis not present

## 2024-06-21 DIAGNOSIS — R3129 Other microscopic hematuria: Secondary | ICD-10-CM | POA: Diagnosis not present

## 2024-06-21 DIAGNOSIS — F17210 Nicotine dependence, cigarettes, uncomplicated: Secondary | ICD-10-CM | POA: Insufficient documentation

## 2024-06-21 LAB — URINALYSIS, ROUTINE W REFLEX MICROSCOPIC
Bilirubin, UA: NEGATIVE
Glucose, UA: NEGATIVE
Leukocytes,UA: NEGATIVE
Nitrite, UA: NEGATIVE
Protein,UA: NEGATIVE
Specific Gravity, UA: 1.025 (ref 1.005–1.030)
Urobilinogen, Ur: 0.2 mg/dL (ref 0.2–1.0)
pH, UA: 5.5 (ref 5.0–7.5)

## 2024-06-21 LAB — MICROSCOPIC EXAMINATION

## 2024-06-21 MED ORDER — CIPROFLOXACIN HCL 500 MG PO TABS
500.0000 mg | ORAL_TABLET | Freq: Once | ORAL | Status: AC
  Administered 2024-06-21: 500 mg via ORAL
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
## 2024-06-22 ENCOUNTER — Encounter: Admitting: Pulmonary Disease

## 2024-06-25 NOTE — Progress Notes (Signed)
 [facility] [address] [place] [address] Phone: [phone]   Assessment and Plan:    1. Chronic left shoulder pain - Chronic with exacerbation, subsequent visit - Recurrence of left shoulder pain, most consistent with rotator cuff pathology - Patient had significant pain relief after subacromial CSI performed on 05/05/2024, however pain has returned over the past 1 to 2 weeks.  Discussed that we would ideally wait at least 3 months in between CSI to same location.  Performing CSI within 3 months can progress degenerative changes.  Patient verbalized understanding and would like to proceed with injection.  Tolerated well per note below.  - Use Tylenol  500 to 1000 mg tablets 2-3 times a day for day-to-day pain relief - Continue HEP and start physical therapy.  Referral sent - Left shoulder x-ray 09/02/2022 is unremarkable.  Patient states he has had shoulder MRI showing partial rotator cuff tearing, however I do not have access to this imaging.  Procedure: Subacromial Injection Side: Left  Risks explained and consent was given verbally. The site was cleaned with alcohol prep. A steroid injection was performed from posterior approach using 2mL of 1% lidocaine  without epinephrine  and 1mL of kenalog  40mg /ml. This was well tolerated.  Needle was removed, hemostasis achieved, and post injection instructions were explained.   Pt was advised to call or return to clinic if these symptoms worsen or fail to improve as anticipated.   Pertinent previous records reviewed include none  Follow Up: 6 to 8 weeks for reevaluation of multiple areas of musculoskeletal pain including left shoulder, left knee, back   Subjective:   I, [name], am serving as a Neurosurgeon for Doctor Morene Mace   Chief Complaint: left shoulder pain  HPI:    04/07/2024 Patient is a 58 year old male with left knee pain. Patient states had surgery and in that  process he has left knee pain. Pain since April.   05/05/24 Patient states knee is feeling good. He would like to discuss CSI in left shoulder   06/28/2024 Patient states shoulder is still in pain.  Discuss PT referral   Relevant Historical Information: History of lumbar spine fusion and left hip replacement  Additional pertinent review of systems negative.   Current Outpatient Medications:    aspirin  81 MG chewable tablet, Chew 1 tablet (81 mg total) by mouth 2 (two) times daily., Disp: 30 tablet, Rfl: 0   atorvastatin  (LIPITOR) 10 MG tablet, Take 1 tablet (10 mg total) by mouth daily., Disp: 90 tablet, Rfl: 3   celecoxib  (CELEBREX ) 100 MG capsule, Take 1 capsule (100 mg total) by mouth 2 (two) times daily between meals as needed., Disp: 30 capsule, Rfl: 0   cyclobenzaprine  (FLEXERIL ) 10 MG tablet, Take 1 tablet (10 mg total) by mouth 3 (three) times daily as needed for muscle spasms. 1 tab po q hs prn muscle spasms, Disp: 40 tablet, Rfl: 1   gabapentin  (NEURONTIN ) 300 MG capsule, TAKE 1 CAPSULE(300 MG) BY MOUTH AT BEDTIME, Disp: 90 capsule, Rfl: 0   losartan  (COZAAR ) 25 MG tablet, Take 1 tablet (25 mg total) by mouth daily., Disp: 90 tablet, Rfl: 3   oxyCODONE  (OXY IR/ROXICODONE ) 5 MG immediate release tablet, Take 1-2 tablets (5-10 mg total) by mouth 3 (three) times daily as needed for moderate pain (pain score 4-6) (pain score 4-6)., Disp: 30 tablet, Rfl: 0   tadalafil  (CIALIS ) 20 MG tablet, TAKE 1/2 TO 1 (ONE-HALF TO ONE) TABLET  BY MOUTH AS NEEDED PRIOR  TO  SEX, Disp: 10 tablet, Rfl: 1   Objective:     Vitals:   06/28/24 1443  Pulse: 82  SpO2: 98%  Weight: 185 lb (83.9 kg)  Height: 6' (1.829 m)      Body mass index is 25.09 kg/m.    Physical Exam:    Gen: Appears well, nad, nontoxic and pleasant Neuro:sensation intact, strength is 5/5 with df/pf/inv/ev, muscle tone wnl Skin: no suspicious lesion or defmority Psych: A&O, appropriate mood and affect   Left shoulder:  No  deformity, swelling or muscle wasting No scapular winging FF 160, abd 160, int 10, ext 90 NTTP over the [place], clavicle, ac, coracoid, biceps groove, humerus, deltoid, trapezius, cervical spine Positive neer, hawkins, empty can, obriens, crossarm, Negative subscap liftoff, speeds Neg ant drawer, sulcus sign, apprehension Negative Spurling's test bilat FROM of neck     Electronically signed by:  Odis Mace [facility] 2:49 PM 06/28/24

## 2024-06-28 ENCOUNTER — Ambulatory Visit: Admitting: Sports Medicine

## 2024-06-28 VITALS — HR 82 | Ht 72.0 in | Wt 185.0 lb

## 2024-06-28 DIAGNOSIS — M25512 Pain in left shoulder: Secondary | ICD-10-CM

## 2024-06-28 DIAGNOSIS — G8929 Other chronic pain: Secondary | ICD-10-CM | POA: Diagnosis not present

## 2024-06-28 NOTE — Patient Instructions (Signed)
 PT referral  6-8 week follow up

## 2024-07-01 ENCOUNTER — Other Ambulatory Visit: Payer: Self-pay | Admitting: Medical

## 2024-07-12 ENCOUNTER — Ambulatory Visit: Payer: Self-pay | Admitting: Medical

## 2024-07-12 NOTE — Addendum Note (Signed)
 Addended by: DORINA DALLAS HERO on: 07/12/2024 02:25 PM   Modules accepted: Orders

## 2024-07-14 ENCOUNTER — Ambulatory Visit (INDEPENDENT_AMBULATORY_CARE_PROVIDER_SITE_OTHER): Admitting: Orthopaedic Surgery

## 2024-07-14 ENCOUNTER — Encounter: Payer: Self-pay | Admitting: Orthopaedic Surgery

## 2024-07-14 ENCOUNTER — Other Ambulatory Visit (INDEPENDENT_AMBULATORY_CARE_PROVIDER_SITE_OTHER)

## 2024-07-14 DIAGNOSIS — Z96642 Presence of left artificial hip joint: Secondary | ICD-10-CM | POA: Diagnosis not present

## 2024-07-14 NOTE — Progress Notes (Signed)
 The patient is a 58 year old gentleman who is now 4-1/2 months status post a left total hip replacement to treat significant left hip pain and arthritis.  He is walking without assistive device and states he is doing great.  He does have a remote history of back surgery of the lower lumbar spine as well.  On exam his leg lengths appear equal.  Both hips move smoothly and fluidly.    An AP pelvis and lateral left hip shows a well-seated left total hip arthroplasty with no complicating features.  At this point he will continue to increase his activities as comfort allows.  Will see him back in 6 months with a standing AP pelvis.  If there are issues before then he knows to reach out and let us  know.

## 2024-07-26 ENCOUNTER — Telehealth: Payer: Self-pay | Admitting: Medical

## 2024-07-26 NOTE — Telephone Encounter (Signed)
 Copied from CRM #[id number]. Topic: Medicare AWV >> Jul 26, 2024  9:02 AM Nathanel DEL wrote: Reason for CRM: Called LVM 07/26/2024 to schedule AWV. Please schedule Virtual or Telehealth visits ONLY  Nathanel Paschal; Care Guide Ambulatory Clinical Support [place] [facility] Direct Dial: [phone]

## 2024-08-03 ENCOUNTER — Other Ambulatory Visit: Payer: Self-pay | Admitting: Medical

## 2024-08-06 NOTE — Progress Notes (Deleted)
    [facility] [address] [place] [address] Phone: [phone]   Assessment and Plan:     ***    Pertinent previous records reviewed include ***   Follow Up: ***     Subjective:   I, Cory Benson, am serving as a Neurosurgeon for Doctor Morene Mace   Chief Complaint: left shoulder pain  HPI:    04/07/2024 Patient is a 58 year old male with left knee pain. Patient states had surgery and in that process he has left knee pain. Pain since April.   05/05/24 Patient states knee is feeling good. He would like to discuss CSI in left shoulder    06/28/2024 Patient states shoulder is still in pain.  Discuss PT referral   08/09/2024 Patient states   Relevant Historical Information: History of lumbar spine fusion and left hip replacement  Additional pertinent review of systems negative.   Current Outpatient Medications:    aspirin  81 MG chewable tablet, Chew 1 tablet (81 mg total) by mouth 2 (two) times daily., Disp: 30 tablet, Rfl: 0   atorvastatin  (LIPITOR) 10 MG tablet, Take 1 tablet (10 mg total) by mouth daily., Disp: 90 tablet, Rfl: 0   celecoxib  (CELEBREX ) 100 MG capsule, Take 1 capsule (100 mg total) by mouth 2 (two) times daily between meals as needed., Disp: 30 capsule, Rfl: 0   cyclobenzaprine  (FLEXERIL ) 10 MG tablet, Take 1 tablet (10 mg total) by mouth 3 (three) times daily as needed for muscle spasms. 1 tab po q hs prn muscle spasms, Disp: 40 tablet, Rfl: 1   gabapentin  (NEURONTIN ) 300 MG capsule, Take 1 capsule (300 mg total) by mouth at bedtime., Disp: 90 capsule, Rfl: 0   losartan  (COZAAR ) 25 MG tablet, Take 1 tablet (25 mg total) by mouth daily., Disp: 90 tablet, Rfl: 3   oxyCODONE  (OXY IR/ROXICODONE ) 5 MG immediate release tablet, Take 1-2 tablets (5-10 mg total) by mouth 3 (three) times daily as needed for moderate pain (pain score 4-6) (pain score 4-6)., Disp: 30 tablet, Rfl: 0   tadalafil  (CIALIS ) 20 MG  tablet, TAKE 1/2 TO 1 (ONE-HALF TO ONE) TABLET BY MOUTH AS NEEDED PRIOR  TO  SEX, Disp: 10 tablet, Rfl: 1   Objective:     There were no vitals filed for this visit.    There is no height or weight on file to calculate BMI.    Physical Exam:    ***   Electronically signed by:  Odis Mace D.[facility] 7:32 AM 08/06/24

## 2024-08-09 ENCOUNTER — Ambulatory Visit: Admitting: Sports Medicine

## 2024-08-16 ENCOUNTER — Ambulatory Visit: Admitting: Medical

## 2024-08-17 ENCOUNTER — Encounter: Admitting: Adult Health

## 2024-08-30 ENCOUNTER — Ambulatory Visit: Admitting: Sports Medicine

## 2024-08-30 NOTE — Progress Notes (Deleted)
    [name] D.[facility]MENTE[facility] [address] [place] [address] Phone: [phone]   Assessment and Plan:     ***    Pertinent previous records reviewed include ***   Follow Up: ***     Subjective:   I, Aulani Shipton, am serving as a Neurosurgeon for Doctor Morene Mace   Chief Complaint: left shoulder pain  HPI:    04/07/2024 Patient is a 57 year old male with left knee pain. Patient states had surgery and in that process he has left knee pain. Pain since April.   05/05/24 Patient states knee is feeling good. He would like to discuss CSI in left shoulder    06/28/2024 Patient states shoulder is still in pain.  Discuss PT referral   08/30/2024 Patient states   Relevant Historical Information: History of lumbar spine fusion and left hip replacement  Additional pertinent review of systems negative.   Current Outpatient Medications:    aspirin  81 MG chewable tablet, Chew 1 tablet (81 mg total) by mouth 2 (two) times daily., Disp: 30 tablet, Rfl: 0   atorvastatin  (LIPITOR) 10 MG tablet, Take 1 tablet (10 mg total) by mouth daily., Disp: 90 tablet, Rfl: 0   celecoxib  (CELEBREX ) 100 MG capsule, Take 1 capsule (100 mg total) by mouth 2 (two) times daily between meals as needed., Disp: 30 capsule, Rfl: 0   cyclobenzaprine  (FLEXERIL ) 10 MG tablet, Take 1 tablet (10 mg total) by mouth 3 (three) times daily as needed for muscle spasms. 1 tab po q hs prn muscle spasms, Disp: 40 tablet, Rfl: 1   gabapentin  (NEURONTIN ) 300 MG capsule, Take 1 capsule (300 mg total) by mouth at bedtime., Disp: 90 capsule, Rfl: 0   losartan  (COZAAR ) 25 MG tablet, Take 1 tablet (25 mg total) by mouth daily., Disp: 90 tablet, Rfl: 3   oxyCODONE  (OXY IR/ROXICODONE ) 5 MG immediate release tablet, Take 1-2 tablets (5-10 mg total) by mouth 3 (three) times daily as needed for moderate pain (pain score 4-6) (pain score 4-6)., Disp: 30 tablet, Rfl: 0   tadalafil  (CIALIS ) 20  MG tablet, TAKE 1/2 TO 1 (ONE-HALF TO ONE) TABLET BY MOUTH AS NEEDED PRIOR  TO  SEX, Disp: 10 tablet, Rfl: 1   Objective:     There were no vitals filed for this visit.    There is no height or weight on file to calculate BMI.    Physical Exam:    ***   Electronically signed by:  Odis Mace D.[facility] 7:37 AM 08/30/24

## 2024-09-07 ENCOUNTER — Ambulatory Visit: Admitting: Medical

## 2024-09-13 NOTE — Progress Notes (Deleted)
 Cardiology Office Note:   Date:  09/13/2024  ID:  Cory Benson, DOB [birth year], MRN [id number] PCP:  Cory Benson  [facility] Providers Cardiologist:  Cory Haws, MD Referring MD: Cory Benson  Chief Complaint/Reason for Referral: Follow-up coronary artery disease ASSESSMENT:    1. Coronary artery disease involving native coronary artery of native heart without angina pectoris   2. Aortic atherosclerosis   3. Hyperlipidemia LDL goal <70   4. Essential hypertension   5. CKD (chronic kidney disease) stage 2, GFR 60-89 ml/min   6. Tobacco abuse     PLAN:   In order of problems listed above: Coronary artery calcification: Continue aspirin  81 mg, atorvastatin  10 mg. Aortic atherosclerosis: Continue aspirin  81 mg, atorvastatin  10 mg Hyperlipidemia: Continue atorvastatin  10 mg.  Check lipid panel, LFTs, LP(a) today*** Hypertension: Continue losartan  25 mg.  Obtain echocardiogram to evaluate further.*** CKD stage II: Continue losartan  25 mg Tobacco abuse:***        {Are you ordering a CV Procedure (e.g. stress test, cath, DCCV, TEE, etc)?   Press F2        :[id number]}   Dispo:  No follow-ups on file.       I spent *** minutes reviewing all clinical data during and prior to this visit including all relevant imaging studies, laboratories, clinical information from other health systems and prior notes from both Cardiology and other specialties, interviewing the patient, conducting a complete physical examination, and coordinating care in order to formulate a comprehensive and personalized evaluation and treatment plan.   History of Present Illness:    FOCUSED PROBLEM LIST:   Coronary artery calcification Chest CT 2025 Aortic atherosclerosis Chest CT 2025 Hyperlipidemia Hypertension CKD stage II Tobacco abuse No AAA abdominal ultrasound 2023 BMI 10 September 2024:  Patient consents to use of AI scribe. The patient returns for routine follow-up.  He  was last seen in July 2024.  At that point in time he was doing well.  His blood pressure was not controlled with diastolic pressure of 86.  No changes were made to his medical regimen.     Current Medications: No outpatient medications have been marked as taking for the 09/16/24 encounter (Appointment) with Cory Beed K, MD.     Review of Systems:   Please see the history of present illness.    All other systems reviewed and are negative.     EKGs/Labs/Other Test Reviewed:   EKG: 2024 normal sinus rhythm  EKG Interpretation Date/Time:    Ventricular Rate:    PR Interval:    QRS Duration:    QT Interval:    QTC Calculation:   R Axis:      Text Interpretation:          CARDIAC STUDIES: Refer to CV Procedures and Imaging Tabs   Risk Assessment/Calculations:   {Does this patient have ATRIAL FIBRILLATION?:[id number]}      Physical Exam:   VS:  There were no vitals taken for this visit.   No BP recorded.  {Refresh Note OR Click here to enter BP  :1}***   Wt Readings from Last 3 Encounters:  06/28/24 185 lb (83.9 kg)  06/21/24 185 lb (83.9 kg)  06/14/24 181 lb 9.6 oz (82.4 kg)      GENERAL:  No apparent distress, AOx3 HEENT:  No carotid bruits, +2 carotid impulses, no scleral icterus CAR: RRR Irregular RR*** no murmurs***, gallops, rubs, or thrills RES:  Clear to auscultation bilaterally ABD:  Soft,  nontender, nondistended, positive bowel sounds x 4 VASC:  +2 radial pulses, +2 carotid pulses NEURO:  CN 2-12 grossly intact; motor and sensory grossly intact PSYCH:  No active depression or anxiety EXT:  No edema, ecchymosis, or cyanosis  Signed, Cory Hamil Benson Jennamarie Goings, MD  09/13/2024 7:14 AM    [facility] [address] [place], [place], [place]  [address] Phone: [phone]; Fax: [phone]   Note:  This document was prepared using Dragon voice recognition software and may include unintentional dictation errors.

## 2024-09-16 ENCOUNTER — Ambulatory Visit: Admitting: Internal Medicine

## 2024-09-16 DIAGNOSIS — N182 Chronic kidney disease, stage 2 (mild): Secondary | ICD-10-CM

## 2024-09-16 DIAGNOSIS — I2510 Atherosclerotic heart disease of native coronary artery without angina pectoris: Secondary | ICD-10-CM

## 2024-09-16 DIAGNOSIS — I700 Atherosclerosis of aorta: Secondary | ICD-10-CM

## 2024-09-16 DIAGNOSIS — Z720 Tobacco use: Secondary | ICD-10-CM

## 2024-09-16 DIAGNOSIS — I10 Essential (primary) hypertension: Secondary | ICD-10-CM

## 2024-09-16 DIAGNOSIS — E785 Hyperlipidemia, unspecified: Secondary | ICD-10-CM

## 2024-09-20 ENCOUNTER — Encounter: Payer: Self-pay | Admitting: Radiology

## 2024-09-27 ENCOUNTER — Encounter: Admitting: Adult Health

## 2024-11-08 ENCOUNTER — Ambulatory Visit: Admitting: Medical

## 2024-11-09 ENCOUNTER — Encounter: Payer: Self-pay | Admitting: Medical

## 2024-11-09 ENCOUNTER — Ambulatory Visit (INDEPENDENT_AMBULATORY_CARE_PROVIDER_SITE_OTHER): Admitting: Medical

## 2024-11-09 VITALS — BP 122/70 | HR 74 | Temp 98.4°F | Ht 72.0 in | Wt 180.0 lb

## 2024-11-09 DIAGNOSIS — M25552 Pain in left hip: Secondary | ICD-10-CM | POA: Diagnosis not present

## 2024-11-09 DIAGNOSIS — G8929 Other chronic pain: Secondary | ICD-10-CM

## 2024-11-09 DIAGNOSIS — I10 Essential (primary) hypertension: Secondary | ICD-10-CM | POA: Diagnosis not present

## 2024-11-09 DIAGNOSIS — N528 Other male erectile dysfunction: Secondary | ICD-10-CM | POA: Diagnosis not present

## 2024-11-09 DIAGNOSIS — M25512 Pain in left shoulder: Secondary | ICD-10-CM

## 2024-11-09 MED ORDER — METHYLPREDNISOLONE 4 MG PO TABS: ORAL_TABLET | ORAL | 0 refills | Status: AC

## 2024-11-09 MED ORDER — TADALAFIL 20 MG PO TABS: ORAL_TABLET | ORAL | 1 refills | Status: AC
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
## 2024-11-09 NOTE — Patient Instructions (Addendum)
 Chronic left shoulder pain with partial rotator cuff tear Severe pain affecting sleep and daily activities. Previous MRI showed partial tear. - Prescribed Medrol  dose pack. - Advised self-referral to sports medicine. - Instructed to avoid Celebrex  due to kidney concerns. - Advised to eat before Medrol  and avoid high sugar foods.  Chronic left hip pain after hip replacement Persistent pain and swelling, likely inflammatory. - Advised self-referral to orthopedist. -filled out disability parking placard.  Lumbar spine pain after lumbar surgery Chronic stiffness and tightness post-surgery. - Advised to avoid exacerbating activities.  Knee pain after knee surgeries Chronic pain exacerbated by long-distance walking.   Essential hypertension Blood pressure well-controlled on losartan . - Continue losartan  25 mg daily.  male erectile dysfunction Requires Cialis  for management. - Refilled Cialis  prescription at [facility].  follow up early february wellness exam or sooner if needed   [employer] To do studies. Call and schedule. [address], [address] [place], [place]  [address] [[email or web address]], [email or web address]]  Phone [phone]

## 2024-11-23 NOTE — Progress Notes (Unsigned)
 "               [facility] [address] [place] [address] Phone: [phone]   Assessment and Plan:     1. Chronic left shoulder pain (Primary) -Chronic with exacerbation, subsequent visit - Recurrence of left shoulder pain.  Concerning for rotator cuff pathology based on multiple recurrences - Recommend further evaluation with MRI of left shoulder based on unremarkable x-ray, no improvement despite >6 weeks of conservative therapy, pain with day-to-day activities, pain >6/10 - Patient has had mild and temporary relief with subacromial CSI.  Elected for repeat CSI today.  Tolerated well per note below - Continue HEP for shoulder and rotator cuff - Patient recently completed prednisone  pack with mild temporary relief, however symptoms returned after completing Dosepak.  Will not repeat p.o. steroid course at this time  Procedure: Subacromial Injection Side: Left  Risks explained and consent was given verbally. The site was cleaned with alcohol prep. A steroid injection was performed from posterior approach using 2mL of 1% lidocaine  without epinephrine  and 1mL of kenalog  40mg /ml. This was well tolerated.  Needle was removed, hemostasis achieved, and post injection instructions were explained.   Pt was advised to call or return to clinic if these symptoms worsen or fail to improve as anticipated.     Pertinent previous records reviewed include none   Follow Up: 1 week after MRI to review results and discuss treatment plan.  Could consider NSAID course versus orthopedic surgery referral versus alternative CSI   Subjective:   I, Cory Benson, am serving as a neurosurgeon for Doctor Morene Mace   Chief Complaint: left shoulder pain  HPI:    04/07/2024 Patient is a 59 year old male with left knee pain. Patient states had surgery and in that process he has left knee pain. Pain since April.   05/05/24 Patient states knee is feeling good. He  would like to discuss CSI in left shoulder    06/28/2024 Patient states shoulder is still in pain.  Discuss PT referral   11/24/2024 Patient states would like another MRI and a CSI   Relevant Historical Information: History of lumbar spine fusion and left hip replacement  Additional pertinent review of systems negative.  Current Medications[1]   Objective:     Vitals:   11/24/24 1347  Pulse: 77  SpO2: 97%  Weight: 181 lb (82.1 kg)  Height: 6' (1.829 m)      Body mass index is 24.55 kg/m.    Physical Exam:    Gen: Appears well, nad, nontoxic and pleasant Neuro:sensation intact, strength is 5/5 with df/pf/inv/ev, muscle tone wnl Skin: no suspicious lesion or defmority Psych: A&O, appropriate mood and affect   Left shoulder:  No deformity, swelling or muscle wasting No scapular winging FF 160, abd 160, int 10, ext 90 NTTP over the [place], clavicle, ac, coracoid, biceps groove, humerus, deltoid, trapezius, cervical spine Positive neer, hawkins, empty can, obriens, crossarm, Negative subscap liftoff, speeds Neg ant drawer, sulcus sign, apprehension Negative Spurling's test bilat FROM of neck     Electronically signed by:  [facility] 1:56 PM 11/24/2024     [1]  Current Outpatient Medications:    aspirin  81 MG chewable tablet, Chew 1 tablet (81 mg total) by mouth 2 (two) times daily., Disp: 30 tablet, Rfl: 0   atorvastatin  (LIPITOR) 10 MG tablet, Take 1 tablet (10 mg total) by mouth daily., Disp: 90  tablet, Rfl: 0   celecoxib  (CELEBREX ) 100 MG capsule, Take 1 capsule (100 mg total) by mouth 2 (two) times daily between meals as needed., Disp: 30 capsule, Rfl: 0   cyclobenzaprine  (FLEXERIL ) 10 MG tablet, Take 1 tablet (10 mg total) by mouth 3 (three) times daily as needed for muscle spasms. 1 tab po q hs prn muscle spasms, Disp: 40 tablet, Rfl: 1   gabapentin  (NEURONTIN ) 300 MG capsule, Take 1 capsule (300 mg total) by mouth at bedtime.,  Disp: 90 capsule, Rfl: 0   losartan  (COZAAR ) 25 MG tablet, Take 1 tablet (25 mg total) by mouth daily., Disp: 90 tablet, Rfl: 3   methylPREDNISolone  (MEDROL ) 4 MG tablet, Standard 6 day taper dose pack, Disp: 21 tablet, Rfl: 0   oxyCODONE  (OXY IR/ROXICODONE ) 5 MG immediate release tablet, Take 1-2 tablets (5-10 mg total) by mouth 3 (three) times daily as needed for moderate pain (pain score 4-6) (pain score 4-6)., Disp: 30 tablet, Rfl: 0   tadalafil  (CIALIS ) 20 MG tablet, TAKE 1/2 TO 1 (ONE-HALF TO ONE) TABLET BY MOUTH AS NEEDED PRIOR  TO  SEX, Disp: 10 tablet, Rfl: 1  "

## 2024-11-24 ENCOUNTER — Ambulatory Visit: Admitting: Sports Medicine

## 2024-11-24 VITALS — HR 77 | Ht 72.0 in | Wt 181.0 lb

## 2024-11-24 DIAGNOSIS — M25512 Pain in left shoulder: Secondary | ICD-10-CM

## 2024-11-24 DIAGNOSIS — G8929 Other chronic pain: Secondary | ICD-10-CM | POA: Diagnosis not present

## 2024-11-24 NOTE — Patient Instructions (Signed)
 MRI referral   Follow up 1 week after to discuss results

## 2024-12-11 ENCOUNTER — Ambulatory Visit
Admission: RE | Admit: 2024-12-11 | Discharge: 2024-12-11 | Disposition: A | Source: Ambulatory Visit | Attending: Sports Medicine

## 2024-12-11 DIAGNOSIS — G8929 Other chronic pain: Secondary | ICD-10-CM

## 2024-12-14 ENCOUNTER — Ambulatory Visit: Payer: Self-pay | Admitting: Sports Medicine

## 2024-12-16 NOTE — Progress Notes (Signed)
 "               Odis Mace D.CLEMENTEEN [facility] [address] [place] [address] Phone: [phone]   Assessment and Plan:     1. Chronic left shoulder pain (Primary) 2. Partial nontraumatic tear of left rotator cuff 3. Primary osteoarthritis of left shoulder -Chronic with exacerbation, subsequent visit - Overall improvement in right shoulder after repeat subacromial CSI performed on 11/24/2024 - Discussed patient's left shoulder MRI which showed at least high-grade partial tearing of the supraspinatus with possible full-thickness tearing, moderate glenohumeral osteoarthritis, moderate to severe AC joint osteoarthritis.  These findings are consistent with patient's recurrent shoulder pain - We discussed that ultimately, patient may benefit from total shoulder replacement.  He plans on retiring at 59 years old and ideally would like to wait until retirement for surgery - Continue HEP for shoulder and rotator cuff - Prednisone  courses have provided mild temporary relief in the past.  Could consider repeat prednisone  course in the future     Pertinent previous records reviewed include left shoulder MRI 12/11/2024 IMPRESSION: 1. Partial-thickness articular-surface tearing with advanced focal tendinopathy of the supraspinatus tendon about 2.5 cm proximal to the distal attachment , with a pinpoint full-thickness partial width tear difficult to exclude. There is also chronic partial thickness articular surface tearing at the distal attachment site. 2. Prominent degenerative acromioclavicular joint spurring with moderate subcortical marrow edema and a small acromioclavicular joint effusion, with distal clavicular spur indentation of the adjacent supraspinatus muscle. 3. Mild subacromial-subdeltoid bursitis. 4. Mild background supraspinatus and infraspinatus tendinopathy. 5. Moderate degenerative chondral thinning of the glenohumeral joint with a small degenerative  subcortical cyst in the superior glenoid.     Follow Up: As needed if no improvement or worsening of symptoms.  If patient gets >3 months relief from subacromial CSI, could consider repeat subacromial CSI.  If <3 months relief, would consider intra-articular CSI versus AC joint CSI   Subjective:   I, [name], am serving as a neurosurgeon for Doctor Morene Mace   Chief Complaint: left shoulder pain  HPI:    04/07/2024 Patient is a 59 year old male with left knee pain. Patient states had surgery and in that process he has left knee pain. Pain since April.   05/05/24 Patient states knee is feeling good. He would like to discuss CSI in left shoulder    06/28/2024 Patient states shoulder is still in pain.  Discuss PT referral    11/24/2024 Patient states would like another MRI and a CSI  12/17/2024 Patient states shoulder has been okay here for MRI report   Relevant Historical Information: History of lumbar spine fusion and left hip replacement  Additional pertinent review of systems negative.  Current Medications[1]   Objective:     Vitals:   12/17/24 1448  Pulse: 85  SpO2: 99%  Weight: 181 lb (82.1 kg)  Height: 6' (1.829 m)      Body mass index is 24.55 kg/m.    Physical Exam:    Gen: Appears well, nad, nontoxic and pleasant Neuro:sensation intact, strength is 5/5 with df/pf/inv/ev, muscle tone wnl Skin: no suspicious lesion or defmority Psych: A&O, appropriate mood and affect   Left shoulder:  No deformity, swelling or muscle wasting No scapular winging FF 160, abd 160, int 10, ext 90 NTTP over the [place], clavicle, ac, coracoid, biceps groove, humerus, deltoid, trapezius, cervical spine Negative neer, hawkins, empty can, obriens, crossarm, Negative subscap liftoff, speeds Neg  ant drawer, sulcus sign, apprehension Negative Spurling's test bilat FROM of neck       Electronically signed by:  Odis Mace D.[facility]MENTE[facility] 3:05 PM  12/17/24     [1]  Current Outpatient Medications:    aspirin  81 MG chewable tablet, Chew 1 tablet (81 mg total) by mouth 2 (two) times daily., Disp: 30 tablet, Rfl: 0   atorvastatin  (LIPITOR) 10 MG tablet, Take 1 tablet (10 mg total) by mouth daily., Disp: 90 tablet, Rfl: 0   celecoxib  (CELEBREX ) 100 MG capsule, Take 1 capsule (100 mg total) by mouth 2 (two) times daily between meals as needed., Disp: 30 capsule, Rfl: 0   cyclobenzaprine  (FLEXERIL ) 10 MG tablet, Take 1 tablet (10 mg total) by mouth 3 (three) times daily as needed for muscle spasms. 1 tab po q hs prn muscle spasms, Disp: 40 tablet, Rfl: 1   gabapentin  (NEURONTIN ) 300 MG capsule, Take 1 capsule (300 mg total) by mouth at bedtime., Disp: 90 capsule, Rfl: 0   losartan  (COZAAR ) 25 MG tablet, Take 1 tablet (25 mg total) by mouth daily., Disp: 90 tablet, Rfl: 3   methylPREDNISolone  (MEDROL ) 4 MG tablet, Standard 6 day taper dose pack, Disp: 21 tablet, Rfl: 0   oxyCODONE  (OXY IR/ROXICODONE ) 5 MG immediate release tablet, Take 1-2 tablets (5-10 mg total) by mouth 3 (three) times daily as needed for moderate pain (pain score 4-6) (pain score 4-6)., Disp: 30 tablet, Rfl: 0   tadalafil  (CIALIS ) 20 MG tablet, TAKE 1/2 TO 1 (ONE-HALF TO ONE) TABLET BY MOUTH AS NEEDED PRIOR  TO  SEX, Disp: 10 tablet, Rfl: 1  "

## 2024-12-17 ENCOUNTER — Ambulatory Visit: Admitting: Sports Medicine

## 2024-12-17 VITALS — HR 85 | Ht 72.0 in | Wt 181.0 lb

## 2024-12-17 DIAGNOSIS — M25512 Pain in left shoulder: Secondary | ICD-10-CM

## 2024-12-17 DIAGNOSIS — G8929 Other chronic pain: Secondary | ICD-10-CM

## 2024-12-17 DIAGNOSIS — M75112 Incomplete rotator cuff tear or rupture of left shoulder, not specified as traumatic: Secondary | ICD-10-CM

## 2024-12-17 DIAGNOSIS — M19012 Primary osteoarthritis, left shoulder: Secondary | ICD-10-CM

## 2024-12-17 NOTE — Patient Instructions (Signed)
Continue HEP  As needed follow up

## 2024-12-20 ENCOUNTER — Telehealth: Payer: Self-pay

## 2024-12-20 ENCOUNTER — Ambulatory Visit

## 2024-12-20 MED ORDER — LOSARTAN POTASSIUM 25 MG PO TABS: 25.0000 mg | ORAL_TABLET | Freq: Every day | ORAL | 0 refills | Status: AC

## 2024-12-20 MED ORDER — ATORVASTATIN CALCIUM 10 MG PO TABS: 10.0000 mg | ORAL_TABLET | Freq: Every day | ORAL | 0 refills | Status: AC

## 2024-12-21 ENCOUNTER — Other Ambulatory Visit: Payer: Self-pay | Admitting: Medical

## 2024-12-22 ENCOUNTER — Ambulatory Visit

## 2024-12-23 ENCOUNTER — Ambulatory Visit

## 2024-12-23 VITALS — BP 120/60 | HR 75 | Temp 98.2°F | Ht 72.0 in | Wt 178.2 lb

## 2024-12-23 DIAGNOSIS — Z0000 Encounter for general adult medical examination without abnormal findings: Secondary | ICD-10-CM | POA: Diagnosis not present
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
## 2024-12-23 NOTE — Progress Notes (Unsigned)
 "  Cardiology Office Note:   Date:  12/23/2024  ID:  Cory Benson, DOB [birth year], MRN [id number] PCP:  [name]  [facility] Providers Cardiologist:  Wendel Haws, MD Referring MD: Dorina Dallas, [name]  Chief Complaint/Reason for Referral: Follow-up coronary artery calcification, aortic atherosclerosis ASSESSMENT:    1. Coronary artery calcification   2. Hyperlipidemia LDL goal <70   3. Aortic atherosclerosis   4. Tobacco abuse   5. BMI 24.0-24.9, adult     PLAN:   In order of problems listed above: Coronary artery calcification: Continue aspirin  81 mg, atorvastatin  10 mg Hyperlipidemia: Continue atorvastatin  10 mg.  Check lipid panel, LFTs, LP(a) today*** Aortic atherosclerosis: Continue aspirin  80 mg, atorvastatin  10 mg Tobacco abuse: Claudication?*** Elevated BMI: Diet and exercise modification         {Are you ordering a CV Procedure (e.g. stress test, cath, DCCV, TEE, etc)?   Press F2        :[id number]}   Dispo:  No follow-ups on file.       I spent *** minutes reviewing all clinical data during and prior to this visit including all relevant imaging studies, laboratories, clinical information from other health systems and prior notes from both Cardiology and other specialties, interviewing the patient, conducting a complete physical examination, and coordinating care in order to formulate a comprehensive and personalized evaluation and treatment plan.   History of Present Illness:    FOCUSED PROBLEM LIST:   Coronary artery calcification Chest CT 2025 Hyperlipidemia Aortic atherosclerosis Chest CT 2025 Tobacco abuse BMI 11 January 2025:  Patient consents to use of AI scribe. The patient returns for routine follow.  Patient was last seen in July 2024.  At that point in time his blood pressure was well-controlled.  He was doing quite well without cardiovascular complaints.     Current Medications: Active Medications[1]   Review of Systems:    Please see the history of present illness.    All other systems reviewed and are negative.     EKGs/Labs/Other Test Reviewed:   EKG: 2024 normal sinus rhythm  EKG Interpretation Date/Time:    Ventricular Rate:    PR Interval:    QRS Duration:    QT Interval:    QTC Calculation:   R Axis:      Text Interpretation:          CARDIAC STUDIES: Refer to CV Procedures and Imaging Tabs   Risk Assessment/Calculations:   {Does this patient have ATRIAL FIBRILLATION?:[id number]}      Physical Exam:   VS:  There were no vitals taken for this visit.       Wt Readings from Last 3 Encounters:  12/23/24 178 lb 3.2 oz (80.8 kg)  12/17/24 181 lb (82.1 kg)  11/24/24 181 lb (82.1 kg)      GENERAL:  No apparent distress, AOx3 HEENT:  No carotid bruits, +2 carotid impulses, no scleral icterus CAR: RRR Irregular RR*** no murmurs***, gallops, rubs, or thrills RES:  Clear to auscultation bilaterally ABD:  Soft, nontender, nondistended, positive bowel sounds x 4 VASC:  +2 radial pulses, +2 carotid pulses NEURO:  CN 2-12 grossly intact; motor and sensory grossly intact PSYCH:  No active depression or anxiety EXT:  No edema, ecchymosis, or cyanosis  Signed, Seidy Labreck K Jawanda Passey, MD  12/23/2024 6:14 PM    [facility] [address] [place], [place], [place]  [address] Phone: [phone]; Fax: [phone]   Note:  This  document was prepared using Conservation officer, historic buildings and may include unintentional dictation errors.    [1]  No outpatient medications have been marked as taking for the 01/03/25 encounter (Appointment) with Xabi Wittler K, MD.   "

## 2024-12-23 NOTE — Patient Instructions (Addendum)
 Mr. Cory Benson,  Thank you for taking the time for your Medicare Wellness Visit. I appreciate your continued commitment to your health goals. Please review the care plan we discussed, and feel free to reach out if I can assist you further.  Please note that Annual Wellness Visits do not include a physical exam. Some assessments may be limited, especially if the visit was conducted virtually. If needed, we may recommend an in-person follow-up with your provider.  Ongoing Care Seeing your primary care provider every 3 to 6 months helps us  monitor your health and provide consistent, personalized care.    Referrals If a referral was made during today's visit and you haven't received any updates within two weeks, please contact the referred provider directly to check on the status.  Recommended Screenings:  Health Maintenance  Topic Date Due   Pneumococcal Vaccine for age over 55 (1 of 2 - PCV) Never done   Hepatitis B Vaccine (1 of 3 - 19+ 3-dose series) Never done   Flu Shot  Never done   COVID-19 Vaccine (1 - 2025-26 season) Never done   Medicare Annual Wellness Visit  12/23/2025   Colon Cancer Screening  01/31/2027   DTaP/Tdap/Td vaccine (2 - Td or Tdap) 06/28/2031   HPV Vaccine (No Doses Required) Completed   Hepatitis C Screening  Completed   HIV Screening  Completed   Zoster (Shingles) Vaccine  Completed   Meningitis B Vaccine  Aged Out       12/23/2024    2:25 PM  Advanced Directives  Does Patient Have a Medical Advance Directive? No  Would patient like information on creating a medical advance directive? No - Patient declined    Vision: Annual vision screenings are recommended for early detection of glaucoma, cataracts, and diabetic retinopathy. These exams can also reveal signs of chronic conditions such as diabetes and high blood pressure.  Dental: Annual dental screenings help detect early signs of oral cancer, gum disease, and other conditions linked to overall health,  including heart disease and diabetes.  Please see the attached documents for additional preventive care recommendations.

## 2025-01-03 ENCOUNTER — Ambulatory Visit: Admitting: Internal Medicine

## 2025-01-03 DIAGNOSIS — I700 Atherosclerosis of aorta: Secondary | ICD-10-CM

## 2025-01-03 DIAGNOSIS — Z720 Tobacco use: Secondary | ICD-10-CM

## 2025-01-03 DIAGNOSIS — E785 Hyperlipidemia, unspecified: Secondary | ICD-10-CM

## 2025-01-03 DIAGNOSIS — Z6824 Body mass index (BMI) 24.0-24.9, adult: Secondary | ICD-10-CM

## 2025-01-03 DIAGNOSIS — I2510 Atherosclerotic heart disease of native coronary artery without angina pectoris: Secondary | ICD-10-CM

## 2025-01-10 ENCOUNTER — Ambulatory Visit: Admitting: Medical

## 2025-01-13 ENCOUNTER — Ambulatory Visit: Admitting: Orthopaedic Surgery

## 2025-12-29 ENCOUNTER — Ambulatory Visit
